# Patient Record
Sex: Male | Born: 1960 | Race: White | Hispanic: No | Marital: Single | Smoking: Former smoker
Health system: Southern US, Community
[De-identification: ages and names within clinical notes are randomized; demographics above are authoritative.]

## PROBLEM LIST (undated history)

## (undated) DIAGNOSIS — I509 Heart failure, unspecified: Secondary | ICD-10-CM

## (undated) DIAGNOSIS — I10 Essential (primary) hypertension: Secondary | ICD-10-CM

## (undated) DIAGNOSIS — I809 Phlebitis and thrombophlebitis of unspecified site: Secondary | ICD-10-CM

## (undated) DIAGNOSIS — M549 Dorsalgia, unspecified: Secondary | ICD-10-CM

## (undated) DIAGNOSIS — F329 Major depressive disorder, single episode, unspecified: Secondary | ICD-10-CM

## (undated) DIAGNOSIS — G8929 Other chronic pain: Secondary | ICD-10-CM

## (undated) DIAGNOSIS — B1910 Unspecified viral hepatitis B without hepatic coma: Secondary | ICD-10-CM

## (undated) DIAGNOSIS — J449 Chronic obstructive pulmonary disease, unspecified: Secondary | ICD-10-CM

## (undated) DIAGNOSIS — N189 Chronic kidney disease, unspecified: Secondary | ICD-10-CM

## (undated) DIAGNOSIS — F419 Anxiety disorder, unspecified: Secondary | ICD-10-CM

## (undated) DIAGNOSIS — F32A Depression, unspecified: Secondary | ICD-10-CM

## (undated) HISTORY — DX: Anxiety disorder, unspecified: F41.9

## (undated) HISTORY — DX: Depression, unspecified: F32.A

## (undated) HISTORY — DX: Essential (primary) hypertension: I10

## (undated) HISTORY — PX: WISDOM TOOTH EXTRACTION: SHX21

## (undated) HISTORY — DX: Major depressive disorder, single episode, unspecified: F32.9

## (undated) HISTORY — DX: Dorsalgia, unspecified: M54.9

## (undated) HISTORY — DX: Chronic kidney disease, unspecified: N18.9

## (undated) HISTORY — DX: Chronic obstructive pulmonary disease, unspecified: J44.9

## (undated) HISTORY — DX: Other chronic pain: G89.29

## (undated) HISTORY — DX: Phlebitis and thrombophlebitis of unspecified site: I80.9

---

## 1990-08-24 DIAGNOSIS — I809 Phlebitis and thrombophlebitis of unspecified site: Secondary | ICD-10-CM

## 1990-08-24 HISTORY — DX: Phlebitis and thrombophlebitis of unspecified site: I80.9

## 2004-10-10 ENCOUNTER — Ambulatory Visit: Payer: Self-pay

## 2004-10-30 ENCOUNTER — Emergency Department: Payer: Self-pay | Admitting: Internal Medicine

## 2007-07-18 ENCOUNTER — Emergency Department: Payer: Self-pay | Admitting: Emergency Medicine

## 2008-06-19 ENCOUNTER — Emergency Department: Payer: Self-pay | Admitting: Emergency Medicine

## 2009-03-19 ENCOUNTER — Emergency Department: Payer: Self-pay | Admitting: Internal Medicine

## 2011-03-09 ENCOUNTER — Emergency Department: Payer: Self-pay | Admitting: Emergency Medicine

## 2011-03-13 ENCOUNTER — Inpatient Hospital Stay: Payer: Self-pay | Admitting: Internal Medicine

## 2011-06-22 ENCOUNTER — Other Ambulatory Visit: Payer: Self-pay | Admitting: Nephrology

## 2013-08-18 LAB — BASIC METABOLIC PANEL
Anion Gap: 4 — ABNORMAL LOW (ref 7–16)
BUN: 22 mg/dL — ABNORMAL HIGH (ref 7–18)
Calcium, Total: 8.8 mg/dL (ref 8.5–10.1)
Chloride: 107 mmol/L (ref 98–107)
Co2: 29 mmol/L (ref 21–32)
EGFR (African American): 45 — ABNORMAL LOW
Glucose: 103 mg/dL — ABNORMAL HIGH (ref 65–99)
Osmolality: 283 (ref 275–301)
Sodium: 140 mmol/L (ref 136–145)

## 2013-08-18 LAB — CBC
HCT: 40.7 % (ref 40.0–52.0)
MCH: 30.3 pg (ref 26.0–34.0)
MCHC: 33.3 g/dL (ref 32.0–36.0)
MCV: 91 fL (ref 80–100)
Platelet: 72 10*3/uL — ABNORMAL LOW (ref 150–440)
RBC: 4.47 10*6/uL (ref 4.40–5.90)
RDW: 15.5 % — ABNORMAL HIGH (ref 11.5–14.5)

## 2013-08-18 LAB — TROPONIN I: Troponin-I: 0.05 ng/mL

## 2013-08-19 ENCOUNTER — Inpatient Hospital Stay: Payer: Self-pay | Admitting: Internal Medicine

## 2013-08-19 LAB — CK-MB
CK-MB: 2.6 ng/mL (ref 0.5–3.6)
CK-MB: 2.5 ng/mL (ref 0.5–3.6)

## 2013-08-19 LAB — TROPONIN I: Troponin-I: 0.06 ng/mL — ABNORMAL HIGH

## 2013-08-19 LAB — MAGNESIUM: Magnesium: 1.7 mg/dL — ABNORMAL LOW

## 2013-08-20 LAB — CBC WITH DIFFERENTIAL/PLATELET
Basophil %: 0.1 %
Eosinophil #: 0 10*3/uL (ref 0.0–0.7)
HCT: 39.5 % — ABNORMAL LOW (ref 40.0–52.0)
HGB: 13.2 g/dL (ref 13.0–18.0)
Lymphocyte %: 7.1 %
MCH: 30.3 pg (ref 26.0–34.0)
MCHC: 33.4 g/dL (ref 32.0–36.0)
Monocyte #: 0.6 x10 3/mm (ref 0.2–1.0)
Monocyte %: 7.6 %
Neutrophil #: 6.5 10*3/uL (ref 1.4–6.5)
Neutrophil %: 85.1 %
Platelet: 86 10*3/uL — ABNORMAL LOW (ref 150–440)
RBC: 4.36 10*6/uL — ABNORMAL LOW (ref 4.40–5.90)

## 2013-08-20 LAB — BASIC METABOLIC PANEL
Anion Gap: 6 — ABNORMAL LOW (ref 7–16)
Calcium, Total: 8.3 mg/dL — ABNORMAL LOW (ref 8.5–10.1)
Chloride: 103 mmol/L (ref 98–107)
Creatinine: 2.27 mg/dL — ABNORMAL HIGH (ref 0.60–1.30)
EGFR (African American): 37 — ABNORMAL LOW
Glucose: 210 mg/dL — ABNORMAL HIGH (ref 65–99)
Osmolality: 283 (ref 275–301)

## 2013-08-20 LAB — LIPID PANEL
Cholesterol: 144 mg/dL (ref 0–200)
HDL Cholesterol: 47 mg/dL (ref 40–60)
Ldl Cholesterol, Calc: 87 mg/dL (ref 0–100)
Triglycerides: 52 mg/dL (ref 0–200)

## 2013-08-20 LAB — TSH: Thyroid Stimulating Horm: 0.527 u[IU]/mL

## 2014-03-17 ENCOUNTER — Inpatient Hospital Stay: Payer: Self-pay | Admitting: Internal Medicine

## 2014-03-17 LAB — CBC
HCT: 38.3 % — ABNORMAL LOW (ref 40.0–52.0)
HGB: 12.5 g/dL — AB (ref 13.0–18.0)
MCH: 29.5 pg (ref 26.0–34.0)
MCHC: 32.6 g/dL (ref 32.0–36.0)
MCV: 90 fL (ref 80–100)
PLATELETS: 96 10*3/uL — AB (ref 150–440)
RBC: 4.24 10*6/uL — AB (ref 4.40–5.90)
RDW: 16.8 % — AB (ref 11.5–14.5)
WBC: 5.5 10*3/uL (ref 3.8–10.6)

## 2014-03-17 LAB — COMPREHENSIVE METABOLIC PANEL
ALK PHOS: 111 U/L
ANION GAP: 4 — AB (ref 7–16)
AST: 25 U/L (ref 15–37)
Albumin: 2.9 g/dL — ABNORMAL LOW (ref 3.4–5.0)
BUN: 19 mg/dL — ABNORMAL HIGH (ref 7–18)
Bilirubin,Total: 0.9 mg/dL (ref 0.2–1.0)
CO2: 29 mmol/L (ref 21–32)
CREATININE: 2.11 mg/dL — AB (ref 0.60–1.30)
Calcium, Total: 8.1 mg/dL — ABNORMAL LOW (ref 8.5–10.1)
Chloride: 107 mmol/L (ref 98–107)
EGFR (African American): 40 — ABNORMAL LOW
EGFR (Non-African Amer.): 34 — ABNORMAL LOW
Glucose: 116 mg/dL — ABNORMAL HIGH (ref 65–99)
Osmolality: 283 (ref 275–301)
Potassium: 3.8 mmol/L (ref 3.5–5.1)
SGPT (ALT): 16 U/L
SODIUM: 140 mmol/L (ref 136–145)
Total Protein: 6.9 g/dL (ref 6.4–8.2)

## 2014-03-17 LAB — CK TOTAL AND CKMB (NOT AT ARMC)
CK, Total: 94 U/L
CK, Total: 86 U/L
CK, Total: 74 U/L
CK-MB: 3 ng/mL (ref 0.5–3.6)
CK-MB: 2.9 ng/mL (ref 0.5–3.6)
CK-MB: 2.7 ng/mL (ref 0.5–3.6)

## 2014-03-17 LAB — TROPONIN I
Troponin-I: 0.05 ng/mL
Troponin-I: 0.04 ng/mL
Troponin-I: 0.03 ng/mL

## 2014-03-17 LAB — PRO B NATRIURETIC PEPTIDE: B-TYPE NATIURETIC PEPTID: 1564 pg/mL — AB (ref 0–125)

## 2014-03-18 LAB — BASIC METABOLIC PANEL
ANION GAP: 8 (ref 7–16)
BUN: 27 mg/dL — ABNORMAL HIGH (ref 7–18)
CALCIUM: 7.7 mg/dL — AB (ref 8.5–10.1)
CHLORIDE: 103 mmol/L (ref 98–107)
CO2: 25 mmol/L (ref 21–32)
Creatinine: 2.37 mg/dL — ABNORMAL HIGH (ref 0.60–1.30)
GFR CALC AF AMER: 35 — AB
GFR CALC NON AF AMER: 30 — AB
Glucose: 172 mg/dL — ABNORMAL HIGH (ref 65–99)
OSMOLALITY: 281 (ref 275–301)
Potassium: 4.4 mmol/L (ref 3.5–5.1)
SODIUM: 136 mmol/L (ref 136–145)

## 2014-03-18 LAB — CBC WITH DIFFERENTIAL/PLATELET
Basophil #: 0 10*3/uL (ref 0.0–0.1)
Basophil %: 0.4 %
EOS ABS: 0 10*3/uL (ref 0.0–0.7)
EOS PCT: 0 %
HCT: 36.5 % — ABNORMAL LOW (ref 40.0–52.0)
HGB: 12.2 g/dL — ABNORMAL LOW (ref 13.0–18.0)
Lymphocyte #: 0.5 10*3/uL — ABNORMAL LOW (ref 1.0–3.6)
Lymphocyte %: 4.7 %
MCH: 29.9 pg (ref 26.0–34.0)
MCHC: 33.5 g/dL (ref 32.0–36.0)
MCV: 89 fL (ref 80–100)
MONO ABS: 0.2 x10 3/mm (ref 0.2–1.0)
MONOS PCT: 2.4 %
NEUTROS PCT: 92.5 %
Neutrophil #: 9.2 10*3/uL — ABNORMAL HIGH (ref 1.4–6.5)
PLATELETS: 99 10*3/uL — AB (ref 150–440)
RBC: 4.08 10*6/uL — ABNORMAL LOW (ref 4.40–5.90)
RDW: 16.9 % — AB (ref 11.5–14.5)
WBC: 9.9 10*3/uL (ref 3.8–10.6)

## 2014-05-13 ENCOUNTER — Inpatient Hospital Stay: Payer: Self-pay | Admitting: Internal Medicine

## 2014-05-13 LAB — URINALYSIS, COMPLETE
BILIRUBIN, UR: NEGATIVE
Bacteria: NONE SEEN
Glucose,UR: NEGATIVE mg/dL (ref 0–75)
Ketone: NEGATIVE
Leukocyte Esterase: NEGATIVE
NITRITE: NEGATIVE
Ph: 5 (ref 4.5–8.0)
Protein: 100
Specific Gravity: 1.017 (ref 1.003–1.030)
Squamous Epithelial: NONE SEEN
WBC UR: 2 /HPF (ref 0–5)

## 2014-05-13 LAB — TROPONIN I
TROPONIN-I: 0.05 ng/mL
TROPONIN-I: 0.04 ng/mL
Troponin-I: 0.08 ng/mL — ABNORMAL HIGH
Troponin-I: 0.04 ng/mL

## 2014-05-13 LAB — BASIC METABOLIC PANEL
Anion Gap: 7 (ref 7–16)
BUN: 17 mg/dL (ref 7–18)
CO2: 27 mmol/L (ref 21–32)
Calcium, Total: 8 mg/dL — ABNORMAL LOW (ref 8.5–10.1)
Chloride: 106 mmol/L (ref 98–107)
Creatinine: 2.19 mg/dL — ABNORMAL HIGH (ref 0.60–1.30)
EGFR (Non-African Amer.): 33 — ABNORMAL LOW
GFR CALC AF AMER: 38 — AB
Glucose: 94 mg/dL (ref 65–99)
Osmolality: 281 (ref 275–301)
POTASSIUM: 3.6 mmol/L (ref 3.5–5.1)
SODIUM: 140 mmol/L (ref 136–145)

## 2014-05-13 LAB — CBC
HCT: 36.8 % — ABNORMAL LOW (ref 40.0–52.0)
HGB: 12.1 g/dL — ABNORMAL LOW (ref 13.0–18.0)
MCH: 29.2 pg (ref 26.0–34.0)
MCHC: 32.9 g/dL (ref 32.0–36.0)
MCV: 89 fL (ref 80–100)
PLATELETS: 73 10*3/uL — AB (ref 150–440)
RBC: 4.15 10*6/uL — ABNORMAL LOW (ref 4.40–5.90)
RDW: 18.4 % — ABNORMAL HIGH (ref 11.5–14.5)
WBC: 6.4 10*3/uL (ref 3.8–10.6)

## 2014-05-13 LAB — PRO B NATRIURETIC PEPTIDE: B-TYPE NATIURETIC PEPTID: 3245 pg/mL — AB (ref 0–125)

## 2014-05-14 LAB — CBC WITH DIFFERENTIAL/PLATELET
Basophil #: 0 10*3/uL (ref 0.0–0.1)
Basophil %: 0.2 %
Eosinophil #: 0 10*3/uL (ref 0.0–0.7)
Eosinophil %: 0.1 %
HCT: 38.3 % — ABNORMAL LOW (ref 40.0–52.0)
HGB: 12.4 g/dL — ABNORMAL LOW (ref 13.0–18.0)
Lymphocyte #: 0.5 10*3/uL — ABNORMAL LOW (ref 1.0–3.6)
Lymphocyte %: 4.4 %
MCH: 29.1 pg (ref 26.0–34.0)
MCHC: 32.3 g/dL (ref 32.0–36.0)
MCV: 90 fL (ref 80–100)
Monocyte #: 0.3 x10 3/mm (ref 0.2–1.0)
Monocyte %: 2.8 %
Neutrophil #: 10.7 10*3/uL — ABNORMAL HIGH (ref 1.4–6.5)
Neutrophil %: 92.5 %
Platelet: 96 10*3/uL — ABNORMAL LOW (ref 150–440)
RBC: 4.25 10*6/uL — ABNORMAL LOW (ref 4.40–5.90)
RDW: 18.7 % — ABNORMAL HIGH (ref 11.5–14.5)
WBC: 11.6 10*3/uL — ABNORMAL HIGH (ref 3.8–10.6)

## 2014-05-14 LAB — LIPID PANEL
Cholesterol: 134 mg/dL (ref 0–200)
HDL Cholesterol: 44 mg/dL (ref 40–60)
Ldl Cholesterol, Calc: 80 mg/dL (ref 0–100)
Triglycerides: 48 mg/dL (ref 0–200)
VLDL Cholesterol, Calc: 10 mg/dL (ref 5–40)

## 2014-05-14 LAB — COMPREHENSIVE METABOLIC PANEL
Albumin: 2.9 g/dL — ABNORMAL LOW (ref 3.4–5.0)
Alkaline Phosphatase: 87 U/L
Anion Gap: 8 (ref 7–16)
BUN: 26 mg/dL — ABNORMAL HIGH (ref 7–18)
Bilirubin,Total: 0.6 mg/dL (ref 0.2–1.0)
Calcium, Total: 8.2 mg/dL — ABNORMAL LOW (ref 8.5–10.1)
Chloride: 106 mmol/L (ref 98–107)
Co2: 22 mmol/L (ref 21–32)
Creatinine: 2.26 mg/dL — ABNORMAL HIGH (ref 0.60–1.30)
EGFR (African American): 37 — ABNORMAL LOW
EGFR (Non-African Amer.): 32 — ABNORMAL LOW
Glucose: 146 mg/dL — ABNORMAL HIGH (ref 65–99)
Osmolality: 279 (ref 275–301)
Potassium: 4.1 mmol/L (ref 3.5–5.1)
SGOT(AST): 18 U/L (ref 15–37)
SGPT (ALT): 15 U/L
Sodium: 136 mmol/L (ref 136–145)
Total Protein: 6.6 g/dL (ref 6.4–8.2)

## 2014-05-14 LAB — TSH: Thyroid Stimulating Horm: 0.66 u[IU]/mL

## 2014-05-14 LAB — URINE CULTURE

## 2014-05-18 LAB — CULTURE, BLOOD (SINGLE)

## 2014-06-11 ENCOUNTER — Ambulatory Visit: Payer: Self-pay | Admitting: Family

## 2014-07-13 ENCOUNTER — Ambulatory Visit: Payer: Self-pay | Admitting: Family

## 2014-10-02 ENCOUNTER — Ambulatory Visit: Payer: Self-pay | Admitting: Family

## 2014-10-18 ENCOUNTER — Ambulatory Visit: Payer: Self-pay | Admitting: Family

## 2014-10-26 ENCOUNTER — Inpatient Hospital Stay: Payer: Self-pay | Admitting: Specialist

## 2014-11-04 ENCOUNTER — Inpatient Hospital Stay: Payer: Self-pay | Admitting: Internal Medicine

## 2014-11-15 ENCOUNTER — Ambulatory Visit: Payer: Self-pay | Admitting: Family

## 2014-11-15 LAB — CBC WITH DIFFERENTIAL/PLATELET
BASOS ABS: 0 10*3/uL (ref 0.0–0.1)
Basophil %: 0.4 %
Eosinophil #: 0.2 10*3/uL (ref 0.0–0.7)
Eosinophil %: 4.7 %
HCT: 36.7 % — ABNORMAL LOW (ref 40.0–52.0)
HGB: 11.7 g/dL — ABNORMAL LOW (ref 13.0–18.0)
LYMPHS PCT: 17.9 %
Lymphocyte #: 0.8 10*3/uL — ABNORMAL LOW (ref 1.0–3.6)
MCH: 28.7 pg (ref 26.0–34.0)
MCHC: 31.7 g/dL — AB (ref 32.0–36.0)
MCV: 91 fL (ref 80–100)
Monocyte #: 0.6 x10 3/mm (ref 0.2–1.0)
Monocyte %: 12.2 %
NEUTROS ABS: 2.9 10*3/uL (ref 1.4–6.5)
Neutrophil %: 64.8 %
PLATELETS: 73 10*3/uL — AB (ref 150–440)
RBC: 4.06 10*6/uL — ABNORMAL LOW (ref 4.40–5.90)
RDW: 16.5 % — AB (ref 11.5–14.5)
WBC: 4.5 10*3/uL (ref 3.8–10.6)

## 2014-11-15 LAB — TROPONIN I: Troponin-I: 0.04 ng/mL — ABNORMAL HIGH

## 2014-11-16 ENCOUNTER — Observation Stay: Payer: Self-pay | Admitting: Internal Medicine

## 2014-11-16 LAB — COMPREHENSIVE METABOLIC PANEL
ANION GAP: 7 (ref 7–16)
Albumin: 3 g/dL — ABNORMAL LOW
Alkaline Phosphatase: 106 U/L
BUN: 19 mg/dL
Bilirubin,Total: 1 mg/dL
CHLORIDE: 109 mmol/L
CO2: 26 mmol/L
Calcium, Total: 8.6 mg/dL — ABNORMAL LOW
Creatinine: 2.28 mg/dL — ABNORMAL HIGH
GFR CALC AF AMER: 36 — AB
GFR CALC NON AF AMER: 31 — AB
GLUCOSE: 110 mg/dL — AB
POTASSIUM: 4.2 mmol/L
SGOT(AST): 21 U/L
SGPT (ALT): 13 U/L — ABNORMAL LOW
SODIUM: 142 mmol/L
Total Protein: 6.2 g/dL — ABNORMAL LOW

## 2014-11-16 LAB — URINALYSIS, COMPLETE
Bacteria: NONE SEEN
Bilirubin,UR: NEGATIVE
Blood: NEGATIVE
Glucose,UR: NEGATIVE mg/dL
Ketone: NEGATIVE
Leukocyte Esterase: NEGATIVE
Nitrite: NEGATIVE
Ph: 7
Protein: 100
RBC,UR: <1 /HPF
Specific Gravity: 1.011
Squamous Epithelial: NONE SEEN
WBC UR: <1 /HPF

## 2014-11-16 LAB — PRO B NATRIURETIC PEPTIDE: B-TYPE NATIURETIC PEPTID: 933 pg/mL — AB

## 2014-11-16 LAB — LIPASE, BLOOD: Lipase: 37 U/L

## 2014-11-17 LAB — CREATININE, SERUM
Creatinine: 2.94 mg/dL — ABNORMAL HIGH
EGFR (African American): 27 — ABNORMAL LOW
EGFR (Non-African Amer.): 23 — ABNORMAL LOW

## 2014-11-17 LAB — TROPONIN I: Troponin-I: 0.03 ng/mL

## 2014-11-17 LAB — TSH: Thyroid Stimulating Horm: 2.842 u[IU]/mL

## 2014-11-17 LAB — PLATELET COUNT: Platelet: 88 10*3/uL — ABNORMAL LOW (ref 150–440)

## 2014-11-18 LAB — CBC WITH DIFFERENTIAL/PLATELET
Basophil #: 0 x10 3/mm 3
Basophil %: 0.2 %
Eosinophil #: 0 x10 3/mm 3
Eosinophil %: 0.1 %
HCT: 34.6 % — ABNORMAL LOW
HGB: 11.3 g/dL — ABNORMAL LOW
Lymphocyte %: 15 %
Lymphs Abs: 0.5 x10 3/mm 3 — ABNORMAL LOW
MCH: 29.7 pg
MCHC: 32.7 g/dL
MCV: 91 fL
Monocyte #: 0.2 "x10 3/mm "
Monocyte %: 7.5 %
Neutrophil #: 2.4 x10 3/mm 3
Neutrophil %: 77.2 %
Platelet: 79 x10 3/mm 3 — ABNORMAL LOW
RBC: 3.81 x10 6/mm 3 — ABNORMAL LOW
RDW: 16.3 % — ABNORMAL HIGH
WBC: 3.1 x10 3/mm 3 — ABNORMAL LOW

## 2014-11-18 LAB — CREATININE, SERUM
Creatinine: 2.75 mg/dL — ABNORMAL HIGH
EGFR (African American): 29 — ABNORMAL LOW
EGFR (Non-African Amer.): 25 — ABNORMAL LOW

## 2014-11-18 LAB — POTASSIUM: Potassium: 4.2 mmol/L

## 2014-11-25 ENCOUNTER — Inpatient Hospital Stay
Admit: 2014-11-25 | Disposition: A | Discharge status: Home / Self Care | Payer: Self-pay | Attending: Internal Medicine | Admitting: Internal Medicine

## 2014-11-25 LAB — COMPREHENSIVE METABOLIC PANEL
ANION GAP: 6 — AB (ref 7–16)
Albumin: 3.2 g/dL — ABNORMAL LOW
Alkaline Phosphatase: 72 U/L
BILIRUBIN TOTAL: 0.6 mg/dL
BUN: 38 mg/dL — AB
CHLORIDE: 105 mmol/L
Calcium, Total: 8.9 mg/dL
Co2: 28 mmol/L
Creatinine: 2.52 mg/dL — ABNORMAL HIGH
EGFR (African American): 32 — ABNORMAL LOW
EGFR (Non-African Amer.): 28 — ABNORMAL LOW
Glucose: 152 mg/dL — ABNORMAL HIGH
Potassium: 4 mmol/L
SGOT(AST): 17 U/L
SGPT (ALT): 16 U/L — ABNORMAL LOW
SODIUM: 139 mmol/L
Total Protein: 6.7 g/dL

## 2014-11-25 LAB — CBC
HCT: 35.1 % — AB (ref 40.0–52.0)
HGB: 11.3 g/dL — ABNORMAL LOW (ref 13.0–18.0)
MCH: 29.6 pg (ref 26.0–34.0)
MCHC: 32.2 g/dL (ref 32.0–36.0)
MCV: 92 fL (ref 80–100)
PLATELETS: 87 10*3/uL — AB (ref 150–440)
RBC: 3.81 10*6/uL — AB (ref 4.40–5.90)
RDW: 17.6 % — ABNORMAL HIGH (ref 11.5–14.5)
WBC: 6.3 10*3/uL (ref 3.8–10.6)

## 2014-11-25 LAB — CK TOTAL AND CKMB (NOT AT ARMC)
CK, Total: 29 U/L — ABNORMAL LOW
CK-MB: 2.4 ng/mL

## 2014-11-25 LAB — PROTIME-INR
INR: 1.2
Prothrombin Time: 15.8 secs — ABNORMAL HIGH

## 2014-11-25 LAB — APTT: ACTIVATED PTT: 35.7 s (ref 23.6–35.9)

## 2014-11-25 LAB — TROPONIN I: Troponin-I: <0.03 ng/mL

## 2014-11-25 LAB — PRO B NATRIURETIC PEPTIDE: B-Type Natriuretic Peptide: 1521 pg/mL — ABNORMAL HIGH

## 2014-11-26 LAB — COMPREHENSIVE METABOLIC PANEL
ALBUMIN: 3.1 g/dL — AB
Alkaline Phosphatase: 72 U/L
Anion Gap: 8 (ref 7–16)
BUN: 40 mg/dL — ABNORMAL HIGH
Bilirubin,Total: 0.7 mg/dL
CALCIUM: 8.6 mg/dL — AB
Chloride: 104 mmol/L
Co2: 26 mmol/L
Creatinine: 2.49 mg/dL — ABNORMAL HIGH
EGFR (Non-African Amer.): 28 — ABNORMAL LOW
GFR CALC AF AMER: 32 — AB
Glucose: 202 mg/dL — ABNORMAL HIGH
Potassium: 4.3 mmol/L
SGOT(AST): 18 U/L
SGPT (ALT): 16 U/L — ABNORMAL LOW
Sodium: 138 mmol/L
Total Protein: 6.6 g/dL

## 2014-11-26 LAB — CBC WITH DIFFERENTIAL/PLATELET
Basophil #: 0 10*3/uL (ref 0.0–0.1)
Basophil %: 0.1 %
Eosinophil #: 0 10*3/uL (ref 0.0–0.7)
Eosinophil %: 0 %
HCT: 34.1 % — ABNORMAL LOW (ref 40.0–52.0)
HGB: 11 g/dL — AB (ref 13.0–18.0)
LYMPHS PCT: 7 %
Lymphocyte #: 0.3 10*3/uL — ABNORMAL LOW (ref 1.0–3.6)
MCH: 29.2 pg (ref 26.0–34.0)
MCHC: 32.2 g/dL (ref 32.0–36.0)
MCV: 91 fL (ref 80–100)
MONOS PCT: 3.4 %
Monocyte #: 0.2 x10 3/mm (ref 0.2–1.0)
NEUTROS PCT: 89.5 %
Neutrophil #: 4.3 10*3/uL (ref 1.4–6.5)
PLATELETS: 84 10*3/uL — AB (ref 150–440)
RBC: 3.76 10*6/uL — AB (ref 4.40–5.90)
RDW: 17.4 % — AB (ref 11.5–14.5)
WBC: 4.8 10*3/uL (ref 3.8–10.6)

## 2014-11-27 LAB — BASIC METABOLIC PANEL
ANION GAP: 7 (ref 7–16)
BUN: 41 mg/dL — AB
Calcium, Total: 8.3 mg/dL — ABNORMAL LOW
Chloride: 100 mmol/L — ABNORMAL LOW
Co2: 26 mmol/L
Creatinine: 2.56 mg/dL — ABNORMAL HIGH
EGFR (Non-African Amer.): 27 — ABNORMAL LOW
GFR CALC AF AMER: 31 — AB
GLUCOSE: 167 mg/dL — AB
POTASSIUM: 4.1 mmol/L
Sodium: 133 mmol/L — ABNORMAL LOW

## 2014-11-29 ENCOUNTER — Ambulatory Visit
Admit: 2014-11-29 | Disposition: A | Discharge status: Home / Self Care | Payer: Self-pay | Attending: Family | Admitting: Family

## 2014-11-30 DIAGNOSIS — I10 Essential (primary) hypertension: Secondary | ICD-10-CM | POA: Insufficient documentation

## 2014-11-30 DIAGNOSIS — M549 Dorsalgia, unspecified: Secondary | ICD-10-CM

## 2014-11-30 DIAGNOSIS — I5031 Acute diastolic (congestive) heart failure: Secondary | ICD-10-CM

## 2014-12-12 ENCOUNTER — Emergency Department
Admit: 2014-12-12 | Disposition: A | Discharge status: Home / Self Care | Payer: Self-pay | Admitting: Emergency Medicine

## 2014-12-12 LAB — CBC WITH DIFFERENTIAL/PLATELET
Basophil #: 0 10*3/uL (ref 0.0–0.1)
Basophil %: 0.9 %
EOS ABS: 0.2 10*3/uL (ref 0.0–0.7)
EOS PCT: 3.4 %
HCT: 34.9 % — ABNORMAL LOW (ref 40.0–52.0)
HGB: 11.2 g/dL — ABNORMAL LOW (ref 13.0–18.0)
Lymphocyte #: 0.7 10*3/uL — ABNORMAL LOW (ref 1.0–3.6)
Lymphocyte %: 15.2 %
MCH: 28.6 pg (ref 26.0–34.0)
MCHC: 32.2 g/dL (ref 32.0–36.0)
MCV: 89 fL (ref 80–100)
MONO ABS: 1 x10 3/mm (ref 0.2–1.0)
Monocyte %: 19.5 %
Neutrophil #: 3 10*3/uL (ref 1.4–6.5)
Neutrophil %: 61 %
Platelet: 94 10*3/uL — ABNORMAL LOW (ref 150–440)
RBC: 3.93 10*6/uL — AB (ref 4.40–5.90)
RDW: 17.8 % — AB (ref 11.5–14.5)
WBC: 4.9 10*3/uL (ref 3.8–10.6)

## 2014-12-12 LAB — COMPREHENSIVE METABOLIC PANEL
ANION GAP: 6 — AB (ref 7–16)
Albumin: 3.2 g/dL — ABNORMAL LOW
Alkaline Phosphatase: 71 U/L
BILIRUBIN TOTAL: 1.3 mg/dL — AB
BUN: 36 mg/dL — ABNORMAL HIGH
CHLORIDE: 100 mmol/L — AB
CO2: 34 mmol/L — AB
CREATININE: 2.57 mg/dL — AB
Calcium, Total: 8.6 mg/dL — ABNORMAL LOW
EGFR (Non-African Amer.): 27 — ABNORMAL LOW
GFR CALC AF AMER: 31 — AB
GLUCOSE: 107 mg/dL — AB
Potassium: 4.5 mmol/L
SGOT(AST): 31 U/L
SGPT (ALT): 18 U/L
SODIUM: 140 mmol/L
Total Protein: 6.5 g/dL

## 2014-12-12 LAB — LIPASE, BLOOD: Lipase: 39 U/L

## 2014-12-13 LAB — URINALYSIS, COMPLETE
Bacteria: NONE SEEN
Bilirubin,UR: NEGATIVE
Blood: NEGATIVE
Glucose,UR: NEGATIVE mg/dL (ref 0–75)
Ketone: NEGATIVE
Leukocyte Esterase: NEGATIVE
NITRITE: NEGATIVE
Ph: 5 (ref 4.5–8.0)
Protein: 100
SPECIFIC GRAVITY: 1.011 (ref 1.003–1.030)
Squamous Epithelial: NONE SEEN

## 2014-12-14 NOTE — Discharge Summary (Signed)
PATIENT NAME:  Gary MonarchMARLEY, Sony B MR#:  [id number] DATE OF BIRTH:  1961/05/13  DATE OF ADMISSION:  08/19/2013 DATE OF DISCHARGE:  08/20/2013  ADMISSION DIAGNOSIS: Shortness of breath.   DISCHARGE DIAGNOSES: 1. Shortness of breath, likely secondary to acute bronchitis with reactive airway disease.  2. Chest pain secondary to problem #1. 3. Chronic headaches with chronic migraines.  4. 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  5. Leg cramps.  6. Chronic renal failure.  7. Smoking dependence.  8. Elevated troponin from supply/demand ischemia.  9. Thrombocytopenia.   DISCHARGE LABORATORY DATA: Sodium 134, potassium 4.0, chloride 103, bicarbonate 25, BUN 36, creatinine 2.27, glucose 210. Troponin is 0.06. TSH of 0.527, white blood cells 7.7, hemoglobin 13.2, hematocrit 36, platelets 86.   CONSULTATIONS: Dr. Juliann Paresallwood.   A 2-D echocardiogram showed an ejection fraction of 70% to 75% with mildly dilated left atrium, severely increased left ventricular posterior wall thickness.   HOSPITAL COURSE: This is a 54 year old male who presented with shortness of breath and malignant hypertension. For further details, please refer to H and P.   1. Shortness of breath, multifactorial related to elevated blood pressure as well as reactive airway disease. The patient had an elevated BNP but his chest x-ray was essentially normal. I did not feel the patient had any kind of congestive heart failure. I think it was more related to his reactive airway disease and bronchitis rather than congestive heart failure. He was started on IV steroids and actually improved with this regimen, as well as antibiotics. He is not requiring oxygen or changing him to p.o. steroids and will continue his antibiotics and be discharged with inhalers.  2. 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 The patient will continue his outpatient medications. His blood pressure was better controlled once we had him on his medications.  3.  Headache. The patient is complaining of a headache. Nitroglycerin was taken off, his headache got better. He does have chronic neck migraines. I strongly advised the patient see a neurologist as an outpatient. He had no focal deficits.  4. Elevated troponins and supply/demand ischemia, not due to acute coronary syndrome. Appreciate Dr. Juliann Paresallwood consult. His echo is stated as above. The patient at some point will need some kind of outpatient cardiac work-up.  5. Leg cramps from hypokalemia, improved. 6. Chronic renal failure. He is at his baseline.  7. Smoking cessation. The patient says he smokes 5 cigarettes a day, did not want a patch. He was counseled.   DISCHARGE MEDICATIONS: 1. Lasix 20 mg daily.  2. Metoprolol 50 mg b.i.d.  3. Clonidine 0.2 mg b.i.d.  4. Hydralazine 50 mg t.i.d.  5. Vitamin D 50,000 international units weekly.  6. Prednisone taper starting at 60 mg, taper by 10 mg every two days.  7. Azithromycin 250 mg x3 days.  8. Advair Diskus 250/50 b.i.d.   DISCHARGE DIET: Low sodium diet.   DISCHARGE FOLLOWUP: Patient will follow up with his primary care physician in 1 to 2 weeks.   TIME SPENT: Approximately 35 minutes on discharge. The patient is medically stable for discharge.  ____________________________ Khayri Kargbo P. Juliene PinaMody, MD spm:kb D: 08/20/2013 14:16:45 ET T: 08/21/2013 00:21:04 ET JOB#: [id number]  cc: Johnni Wunschel P. Juliene PinaMody, MD, <Dictator> Janyth ContesSITAL P Nahmir Zeidman MD ELECTRONICALLY SIGNED 08/21/2013 13:04

## 2014-12-14 NOTE — H&P (Signed)
PATIENT NAME:  Gary Juarez, Gary Juarez MR#:  [id number] DATE OF BIRTH:  Sep 12, 1960  DATE OF ADMISSION:  08/19/2013  PRIMARY CARE PHYSICIAN: None.   REFERRING PHYSICIAN: Dorothea Glassman, MD  CHIEF COMPLAINT: Chest pain and shortness of breath.   HISTORY OF PRESENT ILLNESS: The patient is a 54 year old Caucasian male with past medical history of hypertension, chronic renal insufficiency, history of anasarca from proteinuria with low albumin in the past, chronic kidney disease and tobacco abuse, who is presenting to the ER with a chief complaint of chest pain and shortness of breath. The patient is reporting that he has been not taking any of his blood pressure medications for the past 1 year because of the financial burden. The patient started developing chest pressure on the left side of the chest radiating to the left shoulder and shortness of breath. He also reported left shoulder pain, but he has mentioned that this is chronic in nature. Felt nauseous, but denies any vomiting. Denies any dizziness or loss of consciousness. In the ER, the patient was placed on 2 to 3 liters of oxygen. Initial blood pressure was very high at 216/114 with mean blood pressure of 148. The patient was given 10 mg of labetalol IV x2 and also Dilaudid was given for pain. The patient's blood pressure eventually trended down to 157/75 with pulse 67. The patient also has reported that EMS gave him 4 baby aspirin on his way, which did not relieve his pain, but after Dilaudid was given, pain was significantly improved. The patient has reported that he had cardiac stress test done in year 1990 which was normal. The patient was supposed to see cardiology as an outpatient, but he has not followed up with them in the past 1 year.   PAST MEDICAL HISTORY:  1. Hypertension.  2. Chronic renal insufficiency.  3. Chronic low back pain.  4. Degenerative joint disease.  5. Tobacco abuse.  6. Hypertension.  7. Hepatitis B.   PAST SURGICAL  HISTORY: Denies any.  ALLERGIES: MORPHINE, BUT STATES HE CAN TOLERATE VICODIN AND OXYCODONE.   FAMILY HISTORY: Mother deceased at age 78 secondary to metastatic bone cancer with metastasis to brain. Father is alive at age 9 with a history of coronary artery disease status post coronary stenting.   SOCIAL HISTORY: Lives alone. Smokes half-pack a day and has been smoking for the past 30 years. Denies alcohol or illicit drug usage.   HOME MEDICATIONS:  1. Metoprolol 50 mg 2 times a day. 2. Lasix 20 mg once daily.  3. Hydralazine 50 mg 3 times a day.  4. Clonidine 0.2 mg 2 times a day.   REVIEW OF SYSTEMS: CONSTITUTIONAL: Denies any fever or fatigue.  EYES: Denies blurry vision, double vision. ENT: Denies any epistaxis, discharge.  RESPIRATION: Denies cough, hemoptysis.  CARDIOVASCULAR: Complaining of left-sided chest pain associated with shortness of breath. Denies any palpitations or syncope.  GASTROINTESTINAL: Complaining of nausea. Denies vomiting, diarrhea, abdominal pain.  GENITOURINARY: No dysuria or hematuria.  ENDOCRINE: Denies polyuria or nocturia. Complaining of hypertension. HEMATOLOGIC AND LYMPHATIC: No anemia, easy bruising or bleeding.  INTEGUMENTARY: No acne, rash, lesions.  MUSCULOSKELETAL: No joint pain in the neck. Has chronic low back pain. NEUROLOGIC: Denies vertigo, ataxia.  PSYCHIATRIC: No ADD or OCD.   PHYSICAL EXAMINATION:  VITAL SIGNS: Temperature 98.5, pulse 67, respirations 18, blood pressure 157/75, pulse oximetry 94% on 2 liters.  GENERAL APPEARANCE: Not under acute distress. Moderately built and obese.  HEENT: Normocephalic, atraumatic. Pupils are equally  reacting to light and accommodation. No scleral icterus. No conjunctival injection. No sinus tenderness. Extraocular movements intact. Moist mucous membranes.  NECK: Supple. No JVD. No thyromegaly. No lymphadenopathy. Range of motion is intact. No JVD.  LUNGS: Moderate air entry. Positive rales.   CARDIAC: S1, S2 normal. Regular rate and rhythm. No murmurs.  GASTROINTESTINAL: Soft, obese. Bowel sounds are positive in all 4 quadrants. Nontender, nondistended. No hepatosplenomegaly. No masses felt.  NEUROLOGIC: Awake, alert, oriented x3. Cranial nerves II through XII are grossly intact. Motor and sensory are intact. Reflexes are 2+.  EXTREMITIES: Trace edema is present. No cyanosis. No clubbing.  SKIN: Warm to touch. Normal turgor. No rashes. No lesions.  MUSCULOSKELETAL: No joint effusion, tenderness, erythema.  PSYCHIATRIC: Normal mood and affect.   LABORATORY AND IMAGING STUDIES: Troponin initially 0.05, repeat is 0.06. WBC 5.9, hemoglobin 13.6, hematocrit 40.7, platelets are 72,000. Glucose 103, BUN 32, creatinine 1.93, sodium 140, potassium 3.4, chloride 107, CO2 29, anion gap 4, GFR 39, serum osmolality 283, calcium 8.8. BNP 3215. Portable chest x-ray has revealed cardiac enlargement. A 12-lead EKG: Normal sinus rhythm at 72 beats per minute, possible left atrial enlargement, nonspecific ST-T wave changes.   ASSESSMENT AND PLAN: An 54 year old male coming into the ER with a chief complaint of shortness of breath and chest pain. Will be admitted with the following assessment and plan.   1. Malignant hypertension and new-onset congestive heart failure. Will admit him to telemetry bed. Will resume his home medication of metoprolol, hydralazine and clonidine. Will provide him Lasix IV in view new-onset congestive heart failure. Echocardiogram is ordered. Cardiology consult is placed to Dr. Juliann Paresallwood. Will cycle cardiac biomarkers to rule out acute myocardial infarction. The patient will be on acute coronary syndrome protocol with oxygen, nitroglycerin p.r.n., aspirin, beta blocker and statin.  2. Chest pain with detectable troponin, could be from demand ischemia from malignant hypertension. Will continue close monitoring.  3. Hypertension. Resume his home medications. The patient is  noncompliant with his home medications. Will put a consult to case management and social worker regarding this. The patient is to follow up with a primary care physician on a regular basis. This needs to be addressed by the case management.  4. Chronic renal insufficiency. Will avoid nephrotoxins and monitor renal function closely. If necessary, as the patient is on Lasix, will consider nephrology consult.  5. Tobacco dependence. The patient is counseled to quit smoking. He will be on nicotine patch. 6. Will provide gastrointestinal prophylaxis.  7. Deep vein thrombosis prophylaxis with SCDs in view of thrombocytopenia.   CODE STATUS: The patient is full code.   Diagnosis and plan of care were discussed in detail with the patient and his friend at bedside. They all verbalized understanding of the plan.   TOTAL TIME SPENT ON ADMISSION: 45 minutes.    ____________________________ Ramonita LabAruna Dorthula Bier, MD ag:lb D: 08/19/2013 03:54:19 ET T: 08/19/2013 06:50:34 ET JOB#: [id number]  cc: Ramonita LabAruna Daymein Nunnery, MD, <Dictator> Ramonita LabARUNA Shaleta Ruacho MD ELECTRONICALLY SIGNED 08/23/2013 3:00

## 2014-12-15 NOTE — H&P (Signed)
PATIENT NAME:  Gary MonarchMARLEY, Eon B MR#:  [id number] DATE OF BIRTH:  10/01/1960  DATE OF ADMISSION:  03/17/2014  REFERRING PHYSICIAN: Dr. Dierdre SearlesShevitz.   FAMILY PHYSICIAN: None.   REASON FOR ADMISSION: Progressive shortness of breath with accelerated hypertension.   HISTORY OF PRESENT ILLNESS: The patient is a 54 year old male with a history of COPD/tobacco abuse, benign hypertension and chronic back pain due to degenerative disk disease. He continues to smoke. Does not seek medical attention and is on no medications. He presents to the Emergency Room with progressive shortness of breath and lower extremity swelling. Continues to complain of chronic back pain. In the Emergency Room, the patient was noted to have labored respirations with accelerated hypertension. He did have peripheral edema. Chest x-ray suggested some underlying mild CHF and his BNP was elevated. Denies any cardiac history. He is now admitted for further evaluation.   PAST MEDICAL HISTORY:  1. Obesity.  2. COPD/tobacco abuse.  3. Benign hypertension.  4. Migraine headaches.  5. Degenerative disk disease.  6. Chronic back pain.  7. History of hepatitis B.   MEDICATIONS: None.   ALLERGIES: MORPHINE.   SOCIAL HISTORY: The patient continues to smoke less than a half-pack per day. No history of alcohol abuse.   FAMILY HISTORY: Positive for hypertension, but negative for coronary artery disease, stroke or diabetes.   REVIEW OF SYSTEMS:  CONSTITUTIONAL: No fever. No change in weight.  EYES: No blurred or double vision. No glaucoma.  ENT: No tinnitus or hearing loss. No nasal discharge or bleeding. No difficulty swallowing.  RESPIRATORY: The patient has had cough productive of gray sputum. Some wheezing. No hemoptysis. No painful respiration.  CARDIOVASCULAR: Occasional chest pain which he attributes to his back pain. No palpitations or syncope.  GASTROINTESTINAL: No nausea, vomiting or diarrhea. No abdominal pain. No change in  bowel habits.  GENITOURINARY: No dysuria or hematuria. No incontinence.  ENDOCRINE: No polyuria or polydipsia. No heat or cold intolerance.  HEMATOLOGIC: The patient denies anemia, easy bruising or bleeding.  LYMPHATIC: No swollen glands.  MUSCULOSKELETAL: The patient has pain in his back, but denies pain in his neck, shoulders, knees or hips. No gout.  NEUROLOGIC: No numbness. Denies stroke or seizures.  PSYCHOLOGICAL: The patient denies anxiety, insomnia or depression.   PHYSICAL EXAMINATION:  GENERAL: The patient is obese, chronically ill-appearing, in mild respiratory distress.  VITAL SIGNS: Currently remarkable for a blood pressure of 181/111, heart rate 70, respiratory rate 20, temperature 97.2, saturation 100% on 2 liters.  HEENT: Normocephalic, atraumatic. Pupils equally round and reactive to light and accommodation. Extraocular movements are intact. Sclerae are nonicteric. Conjunctivae are clear. Oropharynx is clear.  NECK: Supple without JVD. No adenopathy or thyromegaly is noted.  LUNGS: Reveal diffuse rhonchi and basilar rales. No wheezes. No dullness. Respiratory effort is mildly increased.  CARDIAC: Regular rate and rhythm with a normal S1 and S2. There is a 2/6 systolic murmur noted. No rubs or gallops are present. PMI is nondisplaced. Chest wall is nontender.  ABDOMEN: Soft, nontender, with normoactive bowel sounds. No organomegaly or masses were appreciated. No hernias or bruits were noted.  EXTREMITIES: Revealed 2+ edema bilaterally with stasis changes. Pulses were 2+ bilaterally.  SKIN: Warm and dry without lesions. Psoriasis-like rash was noted on the knees.  NEUROLOGIC: Revealed cranial nerves II through XII grossly intact. Deep tendon reflexes were symmetric. Motor and sensory exams nonfocal.  PSYCHIATRIC: Revealed a patient who was alert and oriented to person, place and time. He was cooperative  and used good judgment.   LABORATORY DATA: EKG revealed sinus rhythm at 64  beats per minute with lateral T wave inversion. Chest x-ray revealed cardiomegaly with bibasilar interstitial opacities. Lower extremity Dopplers were negative for DVT. His white count was 5.5 with a hemoglobin of 12.5. Glucose was 116 with a BUN of 19, creatinine of 2.11 and a GFR of 34. Sodium 140 with a potassium of 3.8. Troponin 0.05.   ASSESSMENT:  1. Chronic obstructive pulmonary disease exacerbation.  2. Presumed bronchitis.  3. Accelerated hypertension.  4. Acute diastolic congestive heart failure.  5. Stage III chronic kidney disease.  6. Anemia of chronic disease.  7. Obesity.  8. Degenerative disk disease with chronic back pain.  9. Abnormal electrocardiogram.   PLAN: The patient will be admitted to telemetry as a full code. He will be started on aspirin, heparin, oral nitrates and beta blocker therapy. Will use IV Lasix for diuresis. Will follow serial cardiac enzymes and obtain an echocardiogram. Will also consult cardiology because of his occasional chest pain, CHF and abnormal EKG. For his pulmonary disease, will begin IV antibiotics with IV steroids and DuoNeb SVNs. Will supplement oxygen as needed. Will encourage him to stop smoking. Will consult pulmonology and follow up a chest x-ray in the morning. THE PATIENT IS ALLERGIC TO MORPHINE. Will use Norco as needed for pain. Will follow his sugars while on steroids. Will optimize his blood pressure regimen. Follow up routine labs in the morning. He is on a 2 gram sodium diet. Further treatment and evaluation will depend upon the patient's progress.   TOTAL TIME SPENT ON THIS PATIENT: 50 minutes.   ____________________________ Duane Lope Judithann Sheen, MD jds:lb D: 03/17/2014 11:51:03 ET T: 03/17/2014 12:04:08 ET JOB#: [id number]  cc: Duane Lope. Judithann Sheen, MD, <Dictator> Bostyn Bogie Rodena Medin MD ELECTRONICALLY SIGNED 03/18/2014 10:59

## 2014-12-15 NOTE — Consult Note (Signed)
PATIENT NAME:  Gary Juarez, Gary Juarez MR#:  [id number] DATE OF BIRTH:  [birth year]  DATE OF CONSULTATION:  08/19/2013  REFERRING PHYSICIAN:  Dr. Amado Coe CONSULTING PHYSICIAN:  Rudi Knippenberg D. Sintia Mckissic, MD  INDICATION:  Chest pain, shortness of breath.   HISTORY OF PRESENT ILLNESS:  The patient is a 54 year old white male with past history of hypertension, chronic renal insufficiency, anasarca with proteinuria with low albumin in the past, chronic kidney disease, tobacco abuse, presented to the ER with chest pain, shortness of breath.  The patient reported that he has not been taking any of his blood pressure medicines for at least a year because of financial problems.  The patient stated the above and chest pressure in the left side of his chest radiating to his shoulder with shortness of breath.  He has had symptoms   chronic.  He felt nauseous.  Denies any vomiting.  No dizziness, no loss of consciousness.  In the ER pressure was 148.  The patient was given 10 mg labetalol x 2 and Dilaudid for pain.  The patient's blood pressure eventually came down a little and pulse is 167.  The patient reported EMS gave him four baby aspirin on the way which did not seem to help his pain, but after Dilaudid was given he said he had some improvement.  He has had cardiac stress done in 1990, which was normal.  The patient is supposed to see cardiologist outpatient, but did not follow up in the past year.    PAST MEDICAL HISTORY:  Hypertension, chronic renal insufficiency, chronic low back pain, DJD, tobacco abuse, hypertension, hepatitis B.   PAST SURGICAL HISTORY:  None.   ALLERGIES:  MORPHINE, VICODIN, OXYCODONE.   FAMILY HISTORY:  Metastatic bone cancer, coronary artery disease, PCI and stent.   SOCIAL HISTORY:  Lives alone, smokes half a pack a day, has been smoking for 30 years.  No alcohol consumption.  No illicit drug use.   MEDICATIONS:  Metoprolol 50 twice a day, Lasix once a day, hydralazine 50 2 times a day,  clonidine 0.2 twice a day.   REVIEW OF SYSTEMS:  Denies blackout spells or syncope.  No fever.  No chills.  No sweats.  No weight gain or weight gain, hemoptysis, hematemesis, bright red blood per rectum.  No vision change or hearing change.  Denies sputum production, cough.  He has had some nausea, but no vomiting.  He has had some chest pain with some shortness of breath as well as noncompliance.   PHYSICAL EXAMINATION: VITAL SIGNS:  Blood pressure 157/75, pulse 70, respiratory rate 16, afebrile.  HEENT:  Normocephalic, atraumatic.  Pupils equal, reactive to light.  NECK:  Supple.  No significant JVD, bruits or adenopathy.  LUNGS:  Clear to auscultation and percussion.  No significant wheezing, rhonchi, or rale.  HEART:  Regular rate and rhythm.  ABDOMEN:  Positive bowel sounds.  No rebound, guarding, or tenderness.  EXTREMITIES:  Within normal limits.  NEUROLOGIC:  Intact.  SKIN:  Normal.  LABORATORY DATA:  Troponin 0.05.  White count 5.9, hemoglobin 13.6, hematocrit 40.7, platelet count of 72, glucose at 103, BUN of 32, creatinine 1.93, sodium 140, potassium 3.4, chloride 107, CO2 29.  BNP 3215.  Chest x-ray, enlargement of the cardiac, otherwise normal.  EKG:  Normal sinus rhythm, left atrial enlargement, nonspecific ST-T wave changes.   ASSESSMENT: 1.  Malignant hypertension.  2.  Chest pain, possible angina.  3.  Smoking.  4.  Renal insufficiency.  5.  Mild hypoxemia.  6.  Thrombocytopenia.  7.  History of hepatitis B.  8.  Degenerative joint disease.  9.  Chronic lower back pain.   PLAN:  1.  Agree with admit.   2.  Rule out myocardial infarction.  3.  Agree for follow-up telemetry, EKGs and troponins.  4.  Consider functional study or direct therapy with medications.   5.  Continue to treat pain which has some atypical features.  6.  For malignant hypertension, continue hypertension control, resume his outpatient medications.  7.  Because of noncompliance and financial  issues, control and maintain a reasonable blood pressure.  8.  Advised the patient to quit smoking. 9.  Inhalers as necessary for hypoxemia, shortness of breath.  10.  Renal insufficiency, to be followed up with nephrologist and refrain from nephrotoxic drugs.   11.  Consider with a statin therapy as necessary.   12.  Continue therapy for lower back pain.  We will refrain from narcotic use for now and try to control the pain with some other fashion.  Would recommend possible echocardiogram and possible functional study, both of which may be able to be done as an outpatient, have the patient follow up with me in 1 to 2 weeks if he is allowed to be discharged.  If his pain continues or blood pressure becomes difficult, we will consider whether further evaluation as an inpatient is necessary.    ____________________________ Bobbie Stackwayne D. Juliann Paresallwood, MD ddc:ea D: 08/22/2013 23:17:50 ET T: 08/23/2013 00:21:41 ET JOB#: [id number]  cc: Kerem Gilmer D. Juliann Paresallwood, MD, <Dictator> Alwyn PeaWAYNE D Grasiela Jonsson MD ELECTRONICALLY SIGNED 09/21/2013 16:22

## 2014-12-15 NOTE — Consult Note (Signed)
PATIENT NAME:  Gary MonarchMARLEY, Gary Juarez MR#:  [id number] DATE OF BIRTH:  31-Jul-1961  CARDIOLOGY CONSULTATION   DATE OF CONSULTATION:  03/17/2014  CONSULTING PHYSICIAN:  Laurier NancyShaukat A. Dorothey Oetken, MD  INDICATION FOR THE CONSULTATION: Shortness of breath and swelling of the legs.   HISTORY OF PRESENT ILLNESS: This is a 54 year old white male with a past medical history of COPD, hypertension, degenerative disk disease, who presented to the Emergency Room with progressive shortness of breath, swelling of the legs. He says his right leg has been swollen more than the last 4 years, but lately, it got really bad and was hurting in his legs along with severe shortness of breath, PND, orthopnea, and thus he decided to come to the Emergency Room.   PAST MEDICAL HISTORY: He has a history of obesity, COPD, benign hypertension, migraine headache, degenerative disk disease, chronic back pain, history of hepatitis Juarez.   MEDICATIONS AT HOME: None.   ALLERGIES: MORPHINE.   SOCIAL HISTORY: Smokes less than a half-pack per day. No EtOH abuse.   FAMILY HISTORY: Positive for hypertension, coronary artery disease, stroke and diabetes.   PHYSICAL EXAMINATION:  GENERAL: He is alert and oriented x3. He is morbidly obese.  VITAL SIGNS: His blood pressure was elevated to 181/111, heart rate 70, respirations 20, temperature 97.2, saturation 100% at 2 liters.  NECK: Revealed positive JVD.  LUNGS: Good air entry. No rales or rhonchi.  HEART: Normal S1, S2. No audible murmur.  ABDOMEN: Soft, nontender, positive bowel sounds.  EXTREMITIES: 2+ pedal edema.  NEUROLOGICAL: The patient appears to be intact.   DIAGNOSTIC STUDIES: EKG shows normal sinus rhythm, 64 beats per minute, T wave inversion in V5 to V6, with some ST elevation 0.5 mm in lead III and aVF. His labs look unremarkable. Troponins are negative. Chest x-ray shows mild cardiomegaly. Dopplers of the legs: No DVT. Cardiac enzymes are negative.   ASSESSMENT AND PLAN: The  patient has chronic obstructive pulmonary disease, history of anemia and degenerative disk disease, abnormal electrocardiogram. The patient is being admitted with a diagnosis of congestive heart failure, most likely the cause of swelling in the legs and shortness of breath, along with chronic obstructive pulmonary disease. Agree with working diagnosis. Will get echocardiogram to evaluate his ejection fraction. Agree with giving Lasix 40 q.12, hydralazine, isosorbide, losartan and metoprolol. His BUN is slightly elevated at 19, and creatinine is 2.11, so will have to be careful how much Lasix he patient is given, but will make further recommendation after looking at the patient's echocardiogram.   Thank you very much for referral.   ____________________________ Laurier NancyShaukat A. Antoinette Haskett, MD sak:lb D: 03/17/2014 14:28:46 ET T: 03/17/2014 14:49:46 ET JOB#: [id number]  cc: Laurier NancyShaukat A. Terral Cooks, MD, <Dictator> Laurier NancySHAUKAT A Diesel Lina MD ELECTRONICALLY SIGNED 03/29/2014 12:02

## 2014-12-15 NOTE — Discharge Summary (Signed)
PATIENT NAME:  Gary Juarez, Gary Juarez MR#:  [id number] DATE OF BIRTH:  [birth year]  DATE OF ADMISSION:  05/13/2014 DATE OF DISCHARGE:  05/14/2014  ADMITTING DIAGNOSIS: Acute respiratory distress due to congestive heart failure and chronic obstructive pulmonary disease  chronic obstructive pulmonary disease exacerbation.   DISCHARGE DIAGNOSES: 1.  Acute respiratory distress. 2.  Chronic obstructive pulmonary disease exacerbation. 3.  Acute bronchitis.  4.  Acute on chronic diastolic congestive heart failure, mostly right-sided, with lower extremity as well as abdominal wall edema.  5.  Suspected obstructive sleep apnea.  6.  Elevated troponin, likely demand ischemia. No acute coronary syndrome.  7.  Chronic kidney disease. 8.  Obesity with LDL of 80.  9.  Ongoing tobacco abuse.  10.  Medical noncompliance. 11.  Bradycardia to beta blockers.   DISCHARGE CONDITION: Stable.   DISCHARGE MEDICATIONS:  1.  The patient is to start prednisone taper at 50 mg p.o. once on the 27th of September 2015 and then taper x 10 mg until stopped.  2.  Doxycycline monohydrate 100 mg p.o. twice daily.  3.  Isosorbide mononitrate 30 mg daily.  4.  Aspirin 81 mg daily.  5.  Advair Diskus 250/50 one puff twice daily.  6.  Tiotropium 1 inhalation once daily. 7.  Combivent Respimat 1 puff 4 times daily as needed.  8.  Amlodipine 10 mg p.o. daily.  9.  Furosemide 40 mg p.o. daily.  10.  Nicotine transdermal patch 14 mg topically daily.  11.  Catapres 0.1 mg p.o. twice daily.  HOME OXYGEN: None.   DIET: 2 grams salt, low-fat, low-cholesterol, regular consistency.   ACTIVITY LIMITATIONS: As tolerated.  FOLLOWUP APPOINTMENTS: Follow up with the [facility] in 2 days after discharge, Dr. Adrian Blackwater in 2 days after discharge. Also, the patient was recommended to follow up with the heart failure clinic on the 5th of October 2015.  CONSULTANTS: Care management, social work, Dr. Adrian Blackwater.   RADIOLOGIC  STUDIES: Chest x-ray, PA and lateral, 20th of September 2015, revealed vascular congestion with increased pulmonary interstitial markings raising concern for recurrent pulmonary edema. Small bilateral pleural effusions were also noted.   HISTORY AND HOSPITAL COURSE: The patient is a 54 year old Caucasian male with past medical history significant for history of diastolic CHF who is a chronic smoker, presented to the hospital with complaints of shortness of breath. Apparently, the patient has been feeling shortness of breath for the past 1 week, however, it worsened 3 or 4 days prior to admission. He was also having discomfort in his chest, feeling like his heart was fluttering and some tightness in his chest, also some discomfort in the left arm. He also admitted of having 5 to 6 pillow orthopnea. Due to tachypnea and oxygen requirement in the Emergency Room, since his O2 sats were low, the patient was admitted. His initial vitals: Temperature was 100.7, pulse was 69, respirations 22, blood pressure 231/123 and O2 was 94% on room air. Physical exam revealed faint rales at bases bilaterally, tachypneic. The patient did have lower extremity edema all the way to umbilical region.   The patient's lab data, on admission to the hospital, showed elevated beta-type natriuretic peptide of 3245, otherwise BMP was unremarkable. The patient's calcium level was low at 8 and estimated GFR for non-African American would be 33. The patient's first troponin was mildly elevated at 0.08, however, the second as well as third and fourth troponins were within normal limits. TSH was normal at 0.66. White  blood cell count was normal at 6.4, hemoglobin was 12.1, and platelet count was 73,000. Blood cultures taken on the 28th of September 2015 did not show any growth. Urinalysis was remarkable for 3 red blood cells, 2 white blood cells, negative for nitrites or leukocyte esterase, and urine cultures did not show any growth. Chest x-ray  revealed mild congestive heart failure.   The patient was admitted to the hospital for further evaluation. He was initiated on diuretics as well as on steroids, inhalers and antibiotics for his renal failure. Of note, the patient's creatinine level done on admission, the 20th of September 2015, was 2.19. The patient was diuresed and continued on antibiotics and inhalers. With this his condition improved and he felt much more comfortable. His blood pressure was also managed with multiple blood pressure medications. He was seen by cardiologist, Dr. Adrian Blackwater, who felt the patient may have unstable angina and questionable coronary artery disease. He recommended cardiac catheterization as well as echocardiogram done; however, the patient refused both of those studies.  First, in regards to acute respiratory distress, it was felt that the patient's acute respiratory distress was related to both COPD exacerbation as well as CHF.   In regards to COPD exacerbation, the patient was advised to continue antibiotic therapy for 7 more days, also inhalation therapy and steroid taper. He was weaned off oxygen.   In regards to acute on chronic diastolic CHF, it was felt that the patient's diastolic CHF could have been exacerbated by undiagnosed obstructive sleep apnea as well as poor compliance with blood pressure medications. The patient's blood pressure medications were reinstated and social  workers were consulted to give him 3 medications. The patient was diuresed and it is recommended to continue diuresis at home. He was recommended to follow up with his primary care physician to establish primary physician at the [facility] and follow his creatinine levels with diuresis as well as oxygenation and lower extremity swelling.   In regards to suspected obstructive sleep apnea, the patient was recommended sleep study as outpatient; however, he expressed concerns about financial issues and I am not absolutely  convinced that he will be willing to comply with this recommendation.   In regards to elevated troponin, since the patient's first troponin was elevated, however the second and third as well as fourth were within normal limits, it was felt that the patient's troponin was very likely just demand ischemia and no acute coronary syndrome; however, again, the patient refused cardiac catheterization recommended by cardiologist, Dr. Adrian Blackwater. The patient is however to follow up with Dr. Adrian Blackwater if willing in the next 1 week after discharge.   In regards to CKD, the patient's kidney function was poor when he arrived to the hospital. His creatinine was 2.19 with estimated GFR of 33. The patient's creatinine with diuresis remained about the same with level of 2.26 on the day of discharge, 21st of September 2015, with GFR of 32. It is recommended to follow the patient's creatinine level and GFR as outpatient and refer him to a nephrologist if he is agreeable.   The patient was obese. Again, lipid panel was checked while he was in the hospital and LDL was found to be surprisingly good at 80. The patient's total cholesterol was 134. The patient's triglycerides were 48 and the patient's HDL was 44. The patient however still was recommended to continue low-fat, low-cholesterol diet and if possible lose weight. He was also recommended to get nocturnal oximetry  study done to rule out obstructive sleep apnea. His TSH was checked and was found to be normal.   In regards to ongoing tobacco abuse, the patient was counseled extensively and nicotine replacement therapy was initiated for him, although he was not willing to comply with this.  The patient was noted to be bradycardic with beta blockers. For this reason, he was not initiated on any beta blockers since his heart rate dropped down to 30s during sleep while he was on beta blockers. I suspect the patient's bradycardia could have been also obstructive sleep apnea  related.   For malignant hypertension, the patient's blood pressure medications were advanced to current levels watching carefully for cheap medications, possibly 4 to 10 dollar range in [employer], and on the day of discharge, 21st of September 2015, the patient's blood pressure was a little bit better controlled. His temperature was 97.6, pulse was 70s to 90s, respiration rate was 18, blood pressure ranging from 148 systolic to 174 systolic and 70s to 90s diastolic. O2 sats were 94% on room air at rest.   TIME SPENT: 40 minutes.  ____________________________ Katharina Caperima Shaleta Ruacho, MD rv:sb D: 05/15/2014 07:00:01 ET T: 05/15/2014 08:18:14 ET JOB#: [id number]  cc: Katharina Caperima Garnet Chatmon, MD, <Dictator> [facility] Laurier NancyShaukat A. Khan, MD [facility] Scarlette Hogston MD ELECTRONICALLY SIGNED 05/31/2014 13:38

## 2014-12-15 NOTE — Discharge Summary (Signed)
PATIENT NAME:  Gary Juarez, Gary Juarez DATE OF BIRTH:  [birth year]  DATE OF ADMISSION:  03/17/2014 DATE OF DISCHARGE:  03/18/2014  ADMISSION DIAGNOSES:  1. Chronic obstructive pulmonary disease exacerbation. 2. Congestive heart failure exacerbation.  DISCHARGE DIAGNOSES: 1. Chronic obstructive pulmonary disease exacerbation. 2. Acute diastolic congestive heart failure exacerbation.  3. Stage 3 chronic kidney disease.  4. Obesity.  5. Noncompliance.   CONSULTATIONS:  1. Dr. Adrian BlackwaterShaukat Khan.  2. Dr. Belia HemanKasa.   LABORATORIES AT DISCHARGE: White blood cells 9.9, hemoglobin 12.2, hematocrit 37, platelets are 99,000, sodium 136, potassium 4.4, chloride 103, bicarbonate 25, BUN is 27, creatinine 2.37, glucose is 172.   ECHO: normal EF no major valvular abnormalities  HOSPITAL COURSE: This is a 54 year old male who presented with shortness of breath, lower extremity edema noted to have an elevated BNP, chest x-ray concerning for congestive heart failure, as well as some wheezing which led to chronic obstructive pulmonary disease exacerbation. For further details, please refer to H and P.  1. Chronic obstructive pulmonary disease exacerbation. The patient was treated with antibiotics and steroids. He did well from this perspective. On discharge, he actually has no wheezing. Dr. Belia HemanKasa did see the patient in consultation. He will be discharged with a very quick steroid taper and his inhalers were labeled for home use. He is an obese male and snores at night so I do suspect he has underlying obstructive sleep apnea and will need a sleep evaluation.  2. Congestive heart failure exacerbation, diastolic in nature. Dr. Welton FlakesKhan was consulted. The patient was diuresed. He was diuresed about 2 liters and was not was complaining of any shortness of breath. The lower extremity edema had improved.  3. Hypertension The patient was initially started on losartan, Imdur, and metoprolol. However, due to his renal  issues I have held the losartan and he will need metoprolol and Imdur with close followup.  4. Smoking dependence. The patient was encouraged to stop smoking. He was counseled for 3 minutes but did not want a nicotine patch and was not at all interested in quitting smoking.  5. Peripheral edema secondary to problem number 2. His Dopplers were negative for deep venous thrombosis. 6. Acute kidney injury versus chronic kidney disease. His creatinine in the past has been about anywhere between 2 to 2.3, so he has chronic kidney disease stage III. His creatinine needs to be monitored closely as an outpatient and I did discuss this with the patient.   DISCHARGE MEDICATIONS:  1. Imdur 60 mg p.o. b.i.d.  2. Aspirin 81 mg daily.  3. Metoprolol 25 mg daily.  4. Doxycycline 100 mg q. 12 hours.  5. Lasix 40 mg daily.  6. Prednisone taper starting at 50 mg tapered by 10 mg every 2 days.  7. The patient was also discharged with his inhaler that he used here, albuterol 2 puffs q. 4 hours p.r.n. and Advair 250/50 b.i.d.   DISCHARGE DIET: Low sodium diet.   DISCHARGE ACTIVITY: As tolerated.   FOLLOWUP: We had our case manager help with medications as well at discharge planning. The patient is to be referred to [facility].    TIME 40 minutes  ____________________________ Ellenora Talton P. Juliene PinaMody, MD spm:lt D: 03/18/2014 12:22:20 ET T: 03/18/2014 22:47:33 ET JOB#: [id number]  cc: Krisi Azua P. Juliene PinaMody, MD, <Dictator> [facility] Zaylei Mullane P Coree Brame MD ELECTRONICALLY SIGNED 03/19/2014 13:04

## 2014-12-15 NOTE — H&P (Signed)
PATIENT NAME:  Gary Juarez, ATOR MR#:  [id number] DATE OF BIRTH:  [birth year]  DATE OF ADMISSION:  05/13/2014  REFERRING PHYSICIAN:  Loreli Dollar, MD.  PRIMARY CARE PHYSICIAN:  Nonlocal.   ADMISSION DIAGNOSIS:  Acute on chronic congestive heart failure.   HISTORY OF PRESENT ILLNESS:  This is a 54 year old Caucasian male who presents to the Emergency Department after feeling feverish. The patient has felt short of breath for the last week but his breathing has worsened over the last 3-4 days. He also admits to a vague sensation of chest pain that he describes as more consistent with "fluttering." He mentions also some pain in his left arm that is chronic but is now more intense. He endorses 5-6-pillow orthopnea, as well. Due to this tachypnea and oxygen requirement the Emergency Department staff called for admission.   REVIEW OF SYSTEMS:  CONSTITUTIONAL: The patient admits to fever and fatigue.  EYES: The patient denies blurred vision or inflammation.  EARS, NOSE AND THROAT: Denies tinnitus or sore throat.  RESPIRATORY: Admits to shortness of breath, as well as intermittent cough and dyspnea on exertion. CARDIOVASCULAR: Admits to vague chest pain as well as orthopnea and edema. He denies palpitations.  GASTROINTESTINAL: Denies nausea and vomiting, but admits to some loose stools.  GENITOURINARY: Denies dysuria, increased frequency or hesitancy.  ENDOCRINE: The patient denies polyuria or polydipsia.  HEMATOLOGIC AND LYMPHATIC:  Denies easy bruising or bleeding.  INTEGUMENT: Admits to chronic rash, but no skin lesions.  MUSCULOSKELETAL: Denies arthralgias, myalgias, but admits to chronic back pain. NEUROLOGIC: Denies numbness or weakness in his extremities, but admits to headache.  PSYCHIATRIC: Denies depression or suicidal ideation.   PAST MEDICAL HISTORY: COPD, congestive heart failure, hypertension, migraines, degenerative disk disease, back pain and hepatitis B.   PAST SURGICAL HISTORY:   Dental surgery.   FAMILY HISTORY:  Coronary artery disease in his father, his mother is deceased of breast cancer.  Hypertension runs throughout the family.   SOCIAL HISTORY: The patient still smokes. He is down to approximately 1 pack per week now. He has smoked for 33 years. He does not drink or do any drugs.   MEDICATIONS: Taken from most recent discharge summary.   1.  Imdur 60 mg 1 tablet p.o. b.i.d.  2.  Aspirin 81 mg 1 tablet p.o. daily.  3.  Metoprolol 25 mg 1 tablet daily.  4.  Doxycycline 100 mg 1 tablet p.o. b.i.d.  5.  Lasix 40 mg 1 tablet p.o. daily.  6.  Prednisone taper starting with 50 mg and decreasing by 10 mg every 2 days.  7.  Albuterol 2 puffs every 4 hours as needed for coughing, wheezing, or shortness of breath.  8.  Advair 250/50 mg 1 puff b.i.d.   ALLERGIES: MORPHINE.   PERTINENT LABORATORY RESULTS AND RADIOGRAPHIC FINDINGS:  Serum glucose is 94, BNP 3245, BUN 17, creatinine 2.19, calcium is 8, troponin is 0.08. White blood cell count is normal at 6.4 hemoglobin is 12.1, hematocrit is 36.8.   Urine is negative for infection.   Chest x-ray shows increased vascular congestion and interstitial markings raising concern for pulmonary edema. There are small bilateral pleural effusions noted.   PHYSICAL EXAMINATION:  VITAL SIGNS: Temperature is 100.7, pulse 69, respirations 22, blood pressure originally 231/123, now 155/94, pulse oximetry 94% on room air. The patient has been placed on nasal cannula to achieve oxygen saturations greater than 88% with a goal of 92%, because he desaturates with periodic movement.   GENERAL: The  patient is alert and oriented x3, in no apparent distress.  HEENT: Normocephalic, atraumatic. Pupils equal, round, and reactive to light and accommodation. Extraocular movements are intact. Mucous membranes are moist.  NECK: Trachea is midline. No adenopathy.  CHEST: Symmetric and atraumatic.  CARDIOVASCULAR:  Heart sounds somewhat distant.  Regular rate and rhythm. Normal S1, S2. No rubs, clicks, or murmurs appreciated.  LUNGS: There are faint rales at the bases bilaterally. There is normal excursion but the patient is comfortably tachypneic.  ABDOMEN: Positive bowel sounds. Soft, nontender. Fairly distended and tight with nonpitting edema in the lower quadrants bilaterally. There is no hepatosplenomegaly.  GENITOURINARY: Deferred.  MUSCULOSKELETAL: The patient moves all 4 extremities equally, has 5/5 strength in his upper and lower extremities bilaterally.  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  EXTREMITIES: No clubbing or cyanosis but there is 1+ pitting edema of his lower shins and nonpitting edema all the way up to his umbilical region.  NEUROLOGIC: Cranial nerves II-XII are grossly intact.  PSYCHIATRIC: Mood is normal. Affect is congruent.   ASSESSMENT AND PLAN: This is a 54 year old male with acute on chronic congestive heart failure.  1.  Congestive heart failure.  His BNP is elevated over his previous admission for congestive heart failure. His most recent echocardiogram from July 2015 shows a preserved left ventricular ejection fraction but pseudo normal diastolic filling of the left ventricle as well as a severely increased posterior wall of the left ventricle. Today's chest x-ray is consistent with vascular congestion and pulmonary edema. Uncontrolled hypertension may be the etiology of this current episode. He has received 40 mg of IV Lasix in the Emergency Department. We will continue his home dose of 40 mg of oral Lasix in the morning and I have added 20 mg of Lasix in the evening to achieve a comfortable fluid balance.  2.  Chronic obstructive pulmonary disease. The patient appears to be in a mild exacerbation now, as he is tachypneic with some intermittent cough. Hyperinflation and increased thoracic pressures may explain his current troponin leak. He does not have  any ischemic changes to his EKG.  He was given cefepime in the Emergency Department. I will continue his antibiotic coverage with respiratory fluoroquinolone. I have also continued his Advair and albuterol.  3.  Sepsis. The patient meets criteria with fever and tachypnea. The original working diagnosis in the Emergency Department was pneumonia but his  exam is more consistent with fluid overload than lung consolidation. We will treat with the fluoroquinolone for now, but blood cultures have been obtained and if the patient worsens clinically we will certainly add vancomycin.  4.  Hypertension. We will continue the patient's Imdur, but I have cut his beta blocker dose in half and added some hydralazine to his regimen as we would like to avoid ACE inhibitors and calcium channel blockers as they are not good in patients with chronic kidney disease and congestive heart failure, respectively.  5.  Chronic kidney disease. Manage hypertension.  6.  Hepatitis B. 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  7.  Tobacco abuse.  Will do Nicoderm patches.  8.  DVT prophylaxis with heparin.  9.  GI prophylaxis with pantoprazole.   The patient is a full code.   Time spent on admission orders and patient care: Approximately 45 minutes.    ____________________________ Kelton PillarMichael S. Sheryle Hailiamond, MD msd:lt D: 05/13/2014 04:51:37 ET T: 05/13/2014 07:54:29 ET JOB#: [id number]  cc:  Kelton Pillar. Sheryle Hail, MD, <Dictator> Kelton Pillar Tonjua Rossetti MD ELECTRONICALLY SIGNED 05/13/2014 23:55

## 2014-12-15 NOTE — Consult Note (Signed)
PATIENT NAME:  Gary MonarchMARLEY, Gary Juarez MR#:  [id number] DATE OF BIRTH:  1961/01/25  DATE OF CONSULTATION:  05/13/2014  REFERRING PHYSICIAN:   CONSULTING PHYSICIAN:  Laurier NancyShaukat A. Zury Fazzino, MD  INDICATIONS FOR CONSULTATION:  Congestive heart failure, elevated troponin.   HISTORY OF PRESENT ILLNESS: This is a 54 year old white male with a past medical history of renal insufficiency, who presented to the Emergency Room with shortness of breath, PND, orthopnea, leg swelling, and chest pain, associated with some fluttering in the chest.   PAST MEDICAL HISTORY: History of congestive heart failure, migraine, degenerative disk disease, back pain.   FAMILY HISTORY: Positive for coronary artery disease.  His father has coronary artery disease and hypertension.   SOCIAL HISTORY: Still smokes 1 pack per day. He has smoked for 33 years.   MEDICATIONS: Imdur, aspirin, metoprolol, Lasix.   ALLERGIES: None.   PHYSICAL EXAMINATION:  GENERAL: He is alert, oriented x 3, in no acute distress.  VITAL SIGNS: Vitals are stable.  NECK: Positive JVD.  LUNGS: There is crepitation at the bases.  HEART: Regular rate and rhythm. Normal S1, S2. No audible murmur.  ABDOMEN: Soft, nontender, positive bowel sounds.  EXTREMITIES: 1+ pedal edema.   DIAGNOSTIC AND LABORATORY DATA:  His EKG shows sinus rhythm, 81 beats per minute, nonspecific ST-T changes.  His troponin is 0.08, BUN 17, creatinine 2.19, potassium 3.16. His BNP is 3245.  His chest x-ray shows vascular congestion.   ASSESSMENT AND PLAN: The patient has a mildly elevated troponin. There are no acute EKG changes. Occasional chest pain, also presents with congestive heart failure. His prior echo had normal left ventricular systolic function. Concern is coronary artery disease as his father has coronary artery disease and he has multiple risk factors for coronary artery disease as the cause of his congestive heart failure. He has been put on Lasix, hydralazine, isosorbide,  and aspirin. Advise repeating an echocardiogram again.  I will probably do renal protection and do cardiac catheterization tomorrow. Thank you very much for the referral.    ____________________________ Laurier NancyShaukat A. Legna Mausolf, MD sak:nr D: 05/13/2014 11:02:43 ET T: 05/13/2014 11:15:17 ET JOB#: [id number]  cc: Laurier NancyShaukat A. Takenya Travaglini, MD, <Dictator> Laurier NancySHAUKAT A Nyra Anspaugh MD ELECTRONICALLY SIGNED 05/31/2014 9:55

## 2014-12-23 NOTE — Discharge Summary (Signed)
PATIENT NAME:  Gary Juarez, Gary Juarez MR#:  [id number] DATE OF BIRTH:  02-25-1961  DATE OF ADMISSION:  11/16/2014 DATE OF DISCHARGE:  11/18/2014  ADMITTING DIAGNOSES:  1.  Hypoxia.  2.  Abdominal pain.   DISCHARGE DIAGNOSES:  1.  Acute on chronic respiratory failure with hypoxia and hypercapnia.  2.  Acute on chronic diastolic congestive heart failure.  3.  Malignant essential hypertension.  4.  Hypertensive encephalopathy.  5.  Migraine headaches.  6.  Obesity, suspected obesity hypoventilation syndrome/obstructive sleep apnea. The patient qualified for BiPAP, refused.  7.  History of hypertension, chronic kidney disease stage 3, hepatitis B, chronic obstructive pulmonary disease, migraine headaches, degenerative disk disease, tobacco abuse.   DISCHARGE CONDITION: Stable.   DISCHARGE MEDICATIONS: The patient is to continue ProAir HFA 2 puffs 4 times daily as needed, Meclizine 25 mg 3 times daily as needed, Zofran 4 mg 4 times daily as needed, trazodone 100 mg p.o. at bedtime, Bidil 37.5/20 mg 3 times daily, acetaminophen/hydrocodone 325 mg/5 mg 1 tablet every 4 hours as needed, prednisone taper  40 mg p.o. once, then taper x 10 mg every 1 day until stopped, metoprolol 25 mg twice daily, budesonide/formoterol 160/4.5 two 2 puffs twice daily, tiotropium 1 inhalation once daily, amlodipine 10 mg p.o. daily, Lasix 40 mg p.o. twice daily. The patient is not to take Coreg. Home health with physical therapy.   HOME OXYGEN: At 2 liters of oxygen per nasal cannula with portable tank 24/7.   DIET: A 2 gram salt, low-fat, low-cholesterol, regular consistency.   ACTIVITY LIMITATIONS: As tolerated. The patient is referred to cardiac rehabilitation.  FOLLOWUP APPOINTMENT: With [facility] in 2 days after discharge.   CONSULTANTS: Care management; social work; cardiologist,  Kelby Fam. Dorena Cookey, PA-C; Dionisio David, MD; Melody Haver E. Raul Del, MD, pulmonary physician.   RADIOLOGIC STUDIES: Chest x-ray PA and  lateral 11/16/2014 showed peribronchial cuffing suggesting mild pulmonary edema, suspect small right pleural effusion, cardiomegaly is unchanged. Correlation recommended for CHF. Abdomen flat and erect 11/17/2014 revealed normal bowel gas pattern. No obstruction or perforation. CT scan of abdomen and pelvis without contrast 11/17/2014 revealed atherosclerotic changes in the aorta and iliac arteries without aneurysm. Assessment for dissection was limited without IV contrast. No bowel obstruction, no abscess. Appendix appears normal. Nonobstructing 2 mm calculus in the right kidney. Kidneys showed extensive scarring bilaterally. No well-defined renal mass. Assessment for masses with attenuation similar to  renal parenchyma is limited on this noncontrast enhanced study. The contour of the liver suggest underlying cirrhosis. No focal liver lesions were identified beyond a cyst in the left lower medial segment. The sensitivity of noncontrast CT for noncystic mass lesions in the liver is limited. Pleural effusions bilaterally, slightly larger on the left than on the right, minimal pericardial effusion, question a degree of anasarca in the abdominal wall, probable cellulitis along the lower pelvic wall anteriorly, sigmoid diverticulosis without diverticulitis. Chest portable, single view x-ray 11/17/2014 revealed vascular congestion and mild cardiomegaly with mildly increased interstitial markings likely reflecting mild pulmonary edema, small bilateral pleural effusions suspected. Ultrasound of bilateral lower extremities: Doppler ultrasound showed no deep vein thrombosis in bilateral lower extremities. Left arm Doppler ultrasound showed no evidence of deep vein thrombosis in the left upper extremity.    HISTORY OF PRESENT ILLNESS: The patient is a 54 year old Caucasian male with past medical history significant for history of obesity, history of hypertension, CKD, CHF, COPD, migraines, degenerative disk disease, and  ongoing tobacco abuse, presented to the hospital  on 11/16/2014 with complaints of abdominal pains, low back pains, headaches, multiple problems, and he was found to have hypoxia in the Emergency Room. Please refer to Dr. Tinnie Gens admission note on 11/16/2014. On arrival to the hospital, the patient's temperature was 98.1, pulse was 72, respiratory rate was 18, blood pressure 225/103, saturation was 95% on 2 liters of oxygen through nasal cannula. Physical exam was unremarkable except for lower extremity swelling, as well as abdominal wall swelling.    The patient's lab data done on arrival to the hospital showed an elevated glucose level of 110, brain natriuretic peptide was 933, creatinine was 2.28. Otherwise BMP was unremarkable. The patient's liver enzymes showed albumin level of 3.0, otherwise liver enzymes were unremarkable. Troponin was elevated at 0.04 on the first set, 0.03 on the second. TSH was normal at 2.842. White blood cell count was normal at 4.5, hemoglobin was 11.7, and platelet count was 73,000. Absolute neutrophil count was 2.9, which was within normal limits. Urinalysis was unremarkable. ABGs were performed on 11/17/2014, showed pH of 7.29, pCO2 was 61, pO2 was 52, saturation was 89.2% on room air. EKG on arrival revealed normal sinus rhythm at 81 beats per minute, T wave abnormality consider lateral ischemia, prolonged QTc to 480 msec, but no significant change since prior EKG done on 11/01/2014.   HOSPITAL COURSE: The patient was admitted to the hospital for further evaluation. Because of significantly elevated blood pressure, he was initiated on lots of blood pressure medications including IV. He also had a CT scan of abdomen done to rule out aortic aneurysm or possibly dissection; however, we were not able to use contrast for the CT scan due to his kidney failure. CT, however, showed no aneurysmal dilatation or any other abnormalities. The patient's abdominal pain also resolved with  pain medications as well as enema. When he started passing gas he felt much more comfortable. It was felt that the patient's abdominal pain was very likely constipation.   In regards to malignant hypertension, the patient was treated and his blood pressure improved. However, the patient developed acute on chronic diastolic CHF because of elevated blood pressure requiring diuresis. It was felt that patient's acute and now on chronic respiratory failure with hypoxia and hypercapnia was very likely due to acute on chronic diastolic CHF. He was treated with oxygen and requirement for home oxygen was also checked and he met oxygen for home requirement. Consultation with Dr. Raul Del, pulmonologist, was obtained because of hypoxia and concerns that the patient has obesity hypoventilation syndrome, as well as possibly obstructive sleep apnea. The patient was recommended to use BiPAP; however, he did not tolerate BiPAP in the hospital and refused to use it at home although he qualified. We initiated the patient on oxygen and discharged him on oxygen at home.   In regards to his acute on chronic diastolic CHF, he is to continue metoprolol, Norvasc, as well as diuretics. He is to follow up with [facility] for further recommendations in regards to management of his fluid overload.   For hypertensive encephalopathy, the patient's encephalopathy resolved with blood pressure management, but also it was felt that the patient's headache was very likely due to obstructive sleep apnea which is not treated and refused to be treated. The patient was advised, however, to followup with primary care physician and get sleep studies as outpatient if possible. He may benefit from obstructive sleep apnea surgical therapy since he is noncompliant with CPAP or BiPAP.   In regards to  migraine headaches, it was unclear if, in fact, the patient's migraine headaches were hypertension related, as well as possibly nocturnal hypoxia related  since they occur in the morning and disappeared with blood pressure management and oxygenation.   In regards to his CKD, the patient's kidney function was followed while he was in the hospital and remained relatively stable; however, the patient's estimated GFR dipped down because of  significantly elevated blood pressure readings which were noted on admission. The patient's estimated GFR was 25 on the day of discharge, 11/18/2014 with creatinine level of 2.75, which is the patient's baseline.   The patient is being discharged in relatively stable condition with the above-mentioned medications and follow-up. In summary, the patient came in with hypoxia, diffuse abdominal, back pain, headaches, and other kind of pains. It was felt that the patient's hypoxia was related to CHF. The patient was treated with diuretics, as well as blood pressure management. His blood pressure was much improved on the day of discharge; however, the patient still requires quite a lot of supervision for blood pressure management. He was felt to  benefit from BiPAP; however, he refused BiPAP after trying it in the hospital. The patient may benefit from obstructive sleep apnea surgical therapy overall to help him with his medical disabilities he has. The patient, meanwhile, is to continue oxygen, as well as blood pressure medications.   On the day of discharge, the patient's temperature was 98.5, pulse was 67 to 91, respirations were 17 to 18, blood pressure 336 systolic to 122 systolic, diastolic was 44L to 75P, O2 saturations were 95% on 2 liters of oxygen through nasal cannula at rest. The patient's O2 saturations, however dipped down to 87% on exertion on room air on 11/17/2014, and improved to 93% on 2 liters of oxygen with exertion.   TIME SPENT: 40 minutes.    ____________________________ Theodoro Grist, MD rv:TT D: 11/19/2014 12:37:48 ET T: 11/20/2014 01:05:18 ET JOB#: [id number]  cc: Theodoro Grist, MD,  <Dictator> [place]   Dawson Albers MD ELECTRONICALLY SIGNED 12/02/2014 15:29

## 2014-12-23 NOTE — H&P (Signed)
PATIENT NAME:  Gary Juarez, Camp B MR#:  [id number] DATE OF BIRTH:  December 06, 1960  DATE OF ADMISSION:  11/02/2014  REFERRING PHYSICIAN: Chiquita LothJade Sung, MD   PRIMARY CARE PHYSICIAN: [facility].   ADMISSION DIAGNOSES: Malignant hypertension and wheezing.   HISTORY OF PRESENT ILLNESS: This is a 54 year old male who presents to the Emergency Department complaining of the heaviness in his chest and back pain that radiates into his left leg. The patient states that he has been progressively worsening in all 3 of the aforementioned symptoms since visiting his primary care doctor recently. At that time, he states that he was feeling so weak due to the medications he had been taking that he was having to crawl from his bed to the bathroom at night. He attributed this to the strength of his blood pressure medicines. Thus, he has not taken any medicines in 24 hours. This was partly due to the decision made in clinic to reduce his Lasix dose by half. Here in the Emergency Department he presented with some wheezing that resolved with some breathing treatments. He admits to some chest pressure, but denies any pain. He currently does not have any nausea, but he admits to having vomited once in the last few days. His orthopnea is improved, but he states that he sleeps much better on his left side. The patient can walk approximately 15-20 feet without getting short of breath. Due to his uncontrolled hypertension as well as his intermittent tachypnea in the Emergency Department, the Emergency Department staff called for admission.   REVIEW OF SYSTEMS:  CONSTITUTIONAL: The patient denies fever, but admits to generalized weakness.  EYES: Denies blurred vision or inflammation.  EARS, NOSE AND THROAT: Denies tinnitus or sore throat.  RESPIRATORY: Denies cough, but admits to wheezing as well as shortness of breath.  CARDIOVASCULAR: Admits to chest pressure. The patient has 2-pillow orthopnea. He denies palpitations  but admits to dyspnea on exertion.  GASTROINTESTINAL: Denies nausea at present. He denies abdominal pain or diarrhea. The patient admits to heartburn. GENITOURINARY: Denies dysuria, increased frequency or hesitancy of urination.  ENDOCRINE: Denies polyuria or polydipsia.  HEMATOLOGIC AND LYMPHATIC: Denies easy bruising or bleeding.  INTEGUMENTARY: Denies rashes or lesions.  MUSCULOSKELETAL: The patient admits to chronic lower back pain that "worsens in power surges." The pain radiates down his left leg. He denies arthralgias.  NEUROLOGIC: Denies numbness and tingling in his extremities as well as dysarthria.  PSYCHIATRIC: Denies depression or suicidal ideation.   PAST MEDICAL HISTORY: Hypertension, chronic kidney disease, congestive heart failure, hepatitis B, COPD, migraines, degenerative disk disease, and tobacco abuse.   PAST SURGICAL HISTORY: Dental surgery.    SOCIAL HISTORY: The patient lives alone. He has smoked for the last 33 years. He smokes 1-2 cigarettes per day at this time. He does not drink alcohol or do any drugs.   FAMILY HISTORY: The patient's father has coronary artery disease and his mother is deceased of breast cancer. There hypertension throughout members of the family.   MEDICATIONS:  1.  Aspirin 81 mg 1 tablet p.o. daily.  2.  Carvedilol 25 mg 1 tablet p.o. b.i.d.  3.  Clonidine 0.3 mg 1 tablet p.o. t.i.d.  4.  Furosemide 40 mg 1 tablet p.o. b.i.d.  5.  Potassium chloride 20 mEq extended release 1 tablet p.o. daily.  6.  ProAir high-flow inhaler 90 mcg/inhalations 2 puffs inhaled 4 times a day as needed for shortness of breath.   ALLERGIES: MORPHINE.   PERTINENT LABORATORY  RESULTS AND RADIOGRAPHIC FINDINGS: Serum glucose is 151. BNP is 1732. BUN 28, creatinine 2.49, serum sodium 140, potassium 3.8, chloride 108, bicarbonate 26, calcium is 8.7. Troponin is 0.06. White blood cell count is 4.9, hemoglobin is 12.3, hematocrit 37.9, platelet count is 85,000, MCV is 89.  Chest x-ray shows cardiac enlargement. There is no evidence of active pulmonary disease.   PHYSICAL EXAMINATION:  VITAL SIGNS: Temperature is 98.1, pulse 82, respirations 18, blood pressure 204/111, pulse oximetry is 95% on room air.  GENERAL: The patient is alert and oriented x 3 in no apparent distress.  HEENT: Normocephalic, atraumatic. Pupils equal, round, and reactive to light and accommodation. Extraocular movements are intact. Mucous membranes are moist.  NECK: Trachea is midline. No adenopathy. Thyroid nonpalpable, nontender.  CHEST: Symmetric and atraumatic.  CARDIOVASCULAR: Regular rate and rhythm. Normal S1, S2. No rubs, clicks, or murmurs. There is hyperdynamic precordium; the point of maximal impulse is slightly displaced to the left.  LUNGS: Clear to auscultation bilaterally. Normal effort and excursion.  ABDOMEN: Positive bowel sounds. Soft, nontender, nondistended. There is no hepatosplenomegaly. The patient is morbidly obese.  GENITOURINARY: Deferred.  MUSCULOSKELETAL: The patient moves all 4 extremities equally. He has 5/5 strength in his upper and lower extremities bilaterally.  SKIN: Warm and dry. There are no rashes or lesions.  EXTREMITIES: There is no clubbing or cyanosis. The patient does have significant nonpitting edema of the lower extremities to the knees.  NEUROLOGIC: Cranial nerves II-XII are grossly intact.  PSYCHIATRIC: Mood is normal. Affect is congruent. The patient seems to have excellent insight into his illness. His judgment into his medical condition is questionable.   ASSESSMENT AND PLAN: This is a 53 year old male with malignant hypertension and wheezing.  1.  Hypertension, currently uncontrolled. Systolic blood pressure upon arrival to the Emergency Department was 230. I have placed 2 inches of nitroglycerin paste on the patient's chest and instructed the nursing staff to give the patient's morning dose of medicines early, as he has not had any medication  and at least 24 hours. His troponin is mildly elevated likely due to demand ischemia secondary to uncontrolled hypertension. We will continue to trend his cardiac biomarkers.  2.  Chronic obstructive pulmonary disease. The patient was wheezing upon arrival to the Emergency Department, but this has resolved following breathing treatments in the Emergency Department. He is still intermittently tachypneic, but this may be a combination of deconditioning, diastolic heart failure, as well as obesity hypoventilation and/or body habitus. We will continue the patient's outpatient inhaler regimen.  3.  Chronic kidney disease. The patient's kidney function is improving since his last admission to the hospital. He currently has stage IV chronic kidney disease. Our goal currently is to manage the patient's hypertension, which will hopefully result in some improvement in his renal function.  4.  Congestive heart failure. The patient has diastolic dysfunction. His BNP is relatively improved compared to his last hospital admission. We will continue his dose of Lasix per his home regimen. His lungs are currently clear and he has no hypoxia.  5.  Chronic back pain. This is multifactorial. We will manage his pain symptomatically.  6.  Hepatitis B. This is stable.  7.  Tobacco abuse. I will give the patient a NicoDerm patch while in the hospital.  8.  Obesity. The patient's BMI is 44.7. I have encouraged a healthy diet as well as moderate exercise. His body habitus likely contributes to his back pain as well.  9.  Deep  vein thrombosis prophylaxis. Heparin.  10.  Gastrointestinal prophylaxis. Pantoprazole.   CODE STATUS: The patient is a full code.   TIME SPENT ON ADMISSION ORDERS AND PATIENT CARE: Approximately 35 minutes.    ____________________________ Kelton Pillar. Sheryle Hail, MD msd:bm D: 11/02/2014 04:53:06 ET T: 11/02/2014 05:10:00 ET JOB#: [id number]  cc: Kelton Pillar. Sheryle Hail, MD, <Dictator> Kelton Pillar Treana Lacour  MD ELECTRONICALLY SIGNED 11/05/2014 3:39

## 2014-12-23 NOTE — Consult Note (Signed)
PATIENT NAME:  Gary MonarchMARLEY, Shiheem B MR#:  [id number] DATE OF BIRTH:  1961/04/24  DATE OF CONSULTATION:  11/27/2014  REFERRING PHYSICIAN:   CONSULTING PHYSICIAN:  Laurier NancyShaukat A. Kyanne Rials, MD  INDICATION FOR CONSULTATION: Shortness of breath and swelling of the legs.   HISTORY OF PRESENT ILLNESS: This is a 54 year old white male who has repeatedly been admitted to the hospital with CHF who is noncompliant with his medication.  Presented to the hospital with apparently periorbital swelling and swelling of the legs. He has occasional chest pain, but his main complaint was shortness of breath and swelling.  An x-ray was done in the Emergency Room which showed questionable pneumonia.   PAST MEDICAL HISTORY: History of COPD, CHF, chronic back pain, hepatitis B, hypertension.   ALLERGIES: MORPHINE.  FAMILY HISTORY: Positive for hypertension, coronary artery disease, cancer.   SOCIAL HISTORY: He smokes about 1/2 pack per day. No ETOH abuse.   PHYSICAL EXAMINATION:  GENERAL: He is alert and oriented x 3 in no acute distress right now.  VITAL SIGNS: Blood pressure is 135/76, respirations are 20, pulse is 52, temperature 97.6, saturation 95.  NECK: Positive JVD.  LUNGS: A few crepitations at the bases.  HEART: Regular rate and rhythm. Normal S1, S2. No audible murmur.  ABDOMEN: Soft, nontender, positive bowel sounds.  EXTREMITIES: 1+ pedal edema.  NEUROLOGIC: He appears to be intact.   LABORATORY DATA:  Shows white count of 6.3, hemoglobin 11.3, hematocrit 35.1, platelet count 87,000. His troponin is less than 0.03.  INR is 1.2.  X-ray shows a right lower lobe pneumonia.   ASSESSMENT AND PLAN: The patient has history of congestive heart failure, chronic kidney disease and presented with angioedema.  Was given Solu-Medrol. Also has right-sided pneumonia and is being treated for that.  Cardiac point of view, his EKG just shows sinus bradycardia, no acute changes. Cardiac enzymes are negative. No further cardiac  workup is necessary. Thank you very much for the referral.   ____________________________ Laurier NancyShaukat A. Gregery Walberg, MD sak:sp D: 11/27/2014 08:52:49 ET T: 11/27/2014 08:59:33 ET JOB#: [id number]  cc: Laurier NancyShaukat A. Amadea Keagy, MD, <Dictator> Laurier NancySHAUKAT A Megan Hayduk MD ELECTRONICALLY SIGNED 12/04/2014 13:33

## 2014-12-23 NOTE — Discharge Summary (Signed)
PATIENT NAME:  Gary Juarez, Gary Juarez MR#:  [id number] DATE OF BIRTH:  October 11, 1960  DATE OF ADMISSION:  10/26/2014 DATE OF DISCHARGE:  10/27/2014  The patient left against medical advice on 10/27/2014.   BRIEF HOSPITAL COURSE: This is a 54 year old male with a history of chronic kidney disease stage III, history of CHF, hypertension, chronic back pain who presented to the hospital with back and chest pain and noted to have an elevated troponin and noted to be in acute on chronic renal failure. The patient was admitted to the hospital for treatment for his back pain, his renal failure, and his chest pain but he left against medical advice on the morning of 10/26/2013.   TIME SPENT: 30 minutes.    ____________________________ Rolly PancakeVivek J. Cherlynn KaiserSainani, MD vjs:AT D: 11/02/2014 15:03:58 ET T: 11/02/2014 23:50:01 ET JOB#: [id number]  cc: Rolly PancakeVivek J. Cherlynn KaiserSainani, MD, <Dictator> [place] SirenVIVEK J SAINANI MD ELECTRONICALLY SIGNED 11/05/2014 14:35

## 2014-12-23 NOTE — H&P (Signed)
PATIENT NAME:  Gary MonarchMARLEY, [name] MR#:  [id number] DATE OF BIRTH:  1960/12/11  DATE OF ADMISSION:  10/26/2014   PRIMARY CARE PHYSICIAN: Does not have one.   CHIEF COMPLAINT: Chest, back pain.   HISTORY OF PRESENT ILLNESS: This is a 54 year old male who presents to the hospital due to worsening back pain over the past week to 10 days. The patient has chronic back pain after some injury a couple of years ago. The patient's pain has been progressively getting worse over the past few months, although for the past few days it has been significantly worse where he has not been able to get out of bed.  He, therefore came to the ER for further evaluation. The patient also complains of some mild chest pain, but nonradiating to the jaw or to the neck area, associated with some shortness of breath, even at rest and some nausea, but no vomiting.  The patient presented to the Emergency Room, was noted to be in acute on chronic renal failure, also noted to have a mildly elevated troponin at 0.16.  Hospitalist services were contacted for further treatment and evaluation.   REVIEW OF SYSTEMS:  CONSTITUTIONAL: No documented fever. No weight gain, no weight loss.  EYES: No blurred or double vision.  ENT: No tinnitus. No postnasal drip. No redness of the oropharynx.  RESPIRATORY: No cough, no wheeze or hemoptysis. Positive dyspnea.  CARDIOVASCULAR: Positive chest pain. No orthopnea, no palpitations, no syncope.  GASTROINTESTINAL: Positive nausea but no vomiting or diarrhea. No abdominal pain. No melena or hematochezia.  GENITOURINARY:  No dysuria or hematuria.  ENDOCRINE: No polyuria or nocturia. No heat or cold intolerance.  HEMATOLOGY: No anemia no bruising, no bleeding.  INTEGUMENTARY: No rashes. No lesions.  MUSCULOSKELETAL: No arthritis, no swelling, no gout.  NEUROLOGIC: No numbness, tingling. No ataxia. No seizure-type activity.  PSYCHIATRIC: No anxiety, no insomnia, no ADD disorder.   PAST MEDICAL  HISTORY: Consistent with obesity, history of diastolic congestive heart failure, chronic kidney disease stage 3, hypertension, chronic back pain.   ALLERGIES: MORPHINE WHICH CAUSES A HEADACHE.   SOCIAL HISTORY: Still smokes a few cigarettes per day, has been smoking for the past 20+ years. No alcohol abuse. No illicit drug abuse. Lives by himself.   FAMILY HISTORY:  The patient's mother is deceased, died from breast cancer. Father is alive has hypertension and coronary disease.   CURRENT MEDICATIONS: As follows: Tylenol with hydrocodone 5/300, at 1 tablet b.i.d., aspirin 81 mg daily,  Coreg 25 mg b.i.d., clonidine 0.3 mg t.i.d., Lasix 40 mg b.i.d., potassium 20 mEq daily, albuterol inhaler 2 puffs 4 times daily as needed.   PHYSICAL EXAMINATION: Presently is as follows:  VITAL SIGNS: Temperature is 98.2, pulse 57, respirations 18, blood pressure 196/100, sats 92% on room air. GENERAL: He is an unkempt-appearing male, but in no apparent distress.  HEAD, EYES, EARS, NOSE AND THROAT: He is atraumatic, normocephalic. Extraocular muscles are intact.  Pupils are equal and reactive to light.  Sclerae anicteric. No conjunctival injection. No pharyngeal erythema.  NECK: Supple. There is no jugular venous distention. No bruits, no lymphadenopathy, no thyromegaly.  HEART: Regular rate and rhythm. No murmurs, no rubs, no clicks.  LUNGS: Clear to auscultation bilaterally. He has prolonged inspiratory and expiratory phase, negative use of accessory muscles. No dullness to percussion.  ABDOMEN: Soft, flat, nontender, nondistended. Has good bowel sounds. No hepatosplenomegaly appreciated.  EXTREMITIES: No evidence of any cyanosis, clubbing, or peripheral edema. Has +2 pedal and radial  pulses bilaterally.  NEUROLOGICAL: The patient is alert and oriented x 3 with no focal motor or sensory deficits appreciated bilaterally.  SKIN: Moist and warm with no rashes appreciated. LYMPHATIC:  There is no cervical or  axiallary lymphadenopathy.   LABORATORY DATA: Serum glucose of 187, BUN 51, creatinine 3.6, sodium 139, potassium 3.1, chloride 100, bicarbonate 34, troponin 0.16. White cell count 7.1, hemoglobin 15.3, hematocrit 46, platelet count 110.   The patient did have a chest x-ray done which showed cardiomegaly without failure.   ASSESSMENT AND PLAN: This is a 54 year old male with history of obesity, history of congestive heart failure, chronic kidney disease stage 3, hypertension, chronic back pain presents to the hospital due to back and chest pain, progressively getting worse, noted to have an elevated troponin and also noted to be in acute on chronic renal failure.  1.  Chest pain with elevated troponin. The patient does have risk factors given his obesity, tobacco abuse and hypertension, although his EKG just shows left ventricular hypertrophy with voltage criteria but no acute ST or T wave changes. I will observe him on off unit telemetry, follow serial cardiac markers. Continue his aspirin and, beta blocker. We will get a cardiology consult. The patient has been seen by Dr. Welton Flakes before.  2.  Back pain. This is chronic but has gotten worse. He has had no recent trauma. I will get an x-ray of his thoracic spine, give him Dilaudid as needed for pain along with Norco as needed for pain and also Valium.  Follow him clinically.  3.  Acute on chronic renal failure. This is likely prerenal renal in nature. I will hold his Lasix, hydrate him with IV fluids.  Follow his BUN and creatinine. 4.  Thrombocytopenia. This seems chronic in nature. The patient has no acute bleeding.  I will avoid heparin products, follow his platelet count.   5.  Malignant hypertension. I suspect this is probably due to medical noncompliance.  I will continue his Coreg and clonidine for now, add some p.r.n. hydralazine.   CODE STATUS: The patient is a FULL CODE.   TIME SPENT ON ADMISSION:  50 minutes      ____________________________ Rolly Pancake. Cherlynn Kaiser, MD vjs:DT D: 10/26/2014 16:03:50 ET T: 10/26/2014 16:41:25 ET JOB#: [id number]  cc: Rolly Pancake. Cherlynn Kaiser, MD, <Dictator> Houston Siren MD ELECTRONICALLY SIGNED 11/02/2014 10:37

## 2014-12-23 NOTE — H&P (Signed)
PATIENT NAME:  Gary Juarez, Gary Juarez MR#:  [id number] DATE OF BIRTH:  [birth year]  DATE OF ADMISSION:  11/16/2014  REFERRING PHYSICIAN: Marjean Donna, MD  PRIMARY CARE PHYSICIAN: [place]   ADMISSION DIAGNOSIS: Hypoxia as well as multifactorial pain.   HISTORY OF PRESENT ILLNESS: This is a 54 year old Caucasian male who presents to the Emergency Department complaining of lower back pain, abdominal pain and headache. The latter started the morning prior to admission. It began as a frontal headache and then became more global radiating down his neck as well as left side of his back. The patient states that the severity of the headache gradually increased over the day and became more similar to his migraines, with which he is familiar. The abdominal pain began later in the day. He has had 3 episodes of nonbloody, nonbilious vomiting. His back pain is chronic, but seems to be worse the more he vomits. He had some shortness of breath when he was lying flat for periods of time longer than 30 minutes thus he has been sleeping in a recliner. In the Emergency Department, the patient was observed and remained in pain for a good deal of time. He also intermittently required some supplemental oxygen which prompted the Emergency Department to call for admission.   REVIEW OF SYSTEMS: CONSTITUTIONAL: The patient denies fever or weakness.  EYES: Denies blurred vision or inflammation.  EARS, NOSE AND THROAT: Denies tinnitus or sore throat.  RESPIRATORY: Denies cough but admits to some shortness of breath.  CARDIOVASCULAR: Denies chest pain or palpitations but admits to orthopnea when lying flat and on occasion paroxysmal nocturnal dyspnea.  GASTROINTESTINAL: Admits to nausea and vomiting as well as abdominal pain, but denies diarrhea.  GENITOURINARY: Denies dysuria, increased frequency or hesitancy of urination.  ENDOCRINE: Denies polyuria or polydipsia. HEMATOLOGIC AND LYMPHATIC: Denies  easy bruising or bleeding.  INTEGUMENTARY: Denies rashes or lesions.  MUSCULOSKELETAL: Denies myalgias but admits to arthralgias, particularly in his back.  NEUROLOGIC: Denies numbness in his extremities or dysarthria.  PSYCHIATRIC: Denies depression or suicidal ideation.   MEDICATIONS: 1.  Acetaminophen with hydrocodone 325 mg/5 mg one tablet p.o. every 6 hours as needed for moderate pain.  2.  Antivert 25 mg 1 tablet p.o. t.i.d. as needed for dizziness.  3.  BiDil 3.75/20 mg tablets 1 tablet p.o. 3 times a day.  4.  Carvedilol 3.125 mg 1 tablet p.o. b.i.d.  5.  Pro Air high fluoroscopy inhaler 90 mcg/inhalation 2 puffs inhaled 4 times a day as needed for shortness of breath.  6.  Trazodone 100 mg 1 tablet p.o. at bedtime.   ALLERGIES: MORPHINE.  PAST MEDICAL HISTORY: Hypertension, chronic kidney disease, congestive heart failure, hepatitis B, COPD, migraines, degenerative disk disease and tobacco abuse.   PAST SURGICAL HISTORY: Dental surgery.   SOCIAL HISTORY: The patient lives alone. He has smoked for the last 33 years but has been trying to cut back. He does not drink alcohol or do any drugs.   FAMILY HISTORY: Significant for coronary artery disease in his father and his mother is deceased of breast cancer. There are multiple members of the family with hypertension.   PERTINENT DIAGNOSTIC DATA: Serum glucose 110. BNP 933. BUN 19, creatinine 2.28, serum sodium 142, potassium 4.2, chloride 109, bicarb 26, calcium 8.6. Lipase 37. Serum albumin 3, alk phos 106, AST 21, ALT 13. Troponin 0.04. White blood cell count 4.5, hemoglobin 11.7, hematocrit 36.7, platelet count 73,000, MCV 91.   Urinalysis shows 100  mg protein per deciliter but is otherwise normal.  PHYSICAL EXAMINATION: VITAL SIGNS: Temperature 98.1, pulse 72, respirations 18, blood pressure 225/103, pulse ox 95% on 2 liters of oxygen via nasal cannula. GENERAL: The patient is alert and oriented x3.  HEENT: Normocephalic,  atraumatic. Pupils equal, round, and reactive to light and accommodation. Extraocular movements are intact. Mucous membranes are moist.  NECK: Trachea is midline. No adenopathy. Thyroid nonpalpable, nontender.  CHEST: Symmetric and atraumatic. CARDIOVASCULAR: Regular rate and rhythm. Normal S1, S2. No rubs, clicks, or murmurs appreciated.  LUNGS: Clear to auscultation bilaterally. Normal effort and excursion.  ABDOMEN: Positive bowel sounds. Soft. Diffusely tender. No hepatosplenomegaly. There is no rebound tenderness.  GENITOURINARY: Deferred.  MUSCULOSKELETAL: The patient moves all 4 extremities equally, however, I have not seen him walk. He reportedly is able to transfer from his seating arrangements to other rooms of his house albeit with significant difficulty.  SKIN: There are no rashes or lesions. It is warm and dry.  EXTREMITIES: No clubbing or cyanosis, but there is edema of the lower extremities to the thigh. NEUROLOGIC: Cranial nerves II through XII are grossly intact.  PSYCHIATRIC: Mood is normal. Affect is congruent. The patient has good judgment into his medical condition.   ASSESSMENT AND PLAN: A 54 year old male admitted for hypoxia, back pain, abdominal pain and nausea all of which could be part of an abdominal migraine syndrome.  1.  Hypoxia. This is definitely intermittent. The patient had some pulmonary edema seen on chest x-ray, although this must be minimal as he has clear lung for right now. The patient has a history of noncompliance, but his fluid may be due to some change in his medication regimen or his diet. 2.  Abdominal pain. Differential diagnosis includes viral gastroenteritis or medication changes or administration on an empty stomach that may have caused abdominal pain and nausea. The patient seems to have musculoskeletal pain at this time as it is predominantly the lower portion of his abdomen that is more edematous, somewhat like his ankles. The patient has a  nonsurgical abdomen at this moment. We will manage his nausea symptomatically. I have started him on a clear diet for now and may advance as tolerated.  3.  Headache. This is possibly a migraine type headache. At this time, it is supportive of essential viral cause of systemic illness.  4.  Chronic kidney disease. The patient's creatinine is actually improving. He is now barely within stage III renal failure.  5.  Congestive heart failure. The patient reportedly has diastolic dysfunction. His BNP is significantly better than on his previous admission and his lungs are clear. We will continue BiDil.  6.  Chronic back pain. The patient has sciatic pain of the left lower extremity and is also bothered by his right hip. The pain is likely multifactorial. We will continue to manage symptomatically. 7.  Chronic obstructive pulmonary disease. The patient is intermittently tachypneic. Right now he does not appear to be septic and thus does not need antibiotics or taper of steroids. 8.  Tobacco abuse. I will prescribe the patient a NicoDerm patch. 9.  Hepatitis B. Stable.  10.  Deep venous thrombosis prophylaxis with Heparin.  11.  Gastrointestinal prophylaxis with pantoprazole due to his appetite restrictions for now.   CODE STATUS: The patient is a FULL code.   TIME SPENT ON ADMISSION ORDERS AND PATIENT CARE: Approximately 40 minutes.  ____________________________ Norva Riffle. Marcille Blanco, MD msd:sb D: 11/16/2014 10:48:04 ET T: 11/16/2014 11:09:33 ET JOB#: [id number]  cc: Norva Riffle. Marcille Blanco, MD, <Dictator> Norva Riffle Plato Alspaugh MD ELECTRONICALLY SIGNED 11/17/2014 1:37

## 2014-12-23 NOTE — Discharge Summary (Signed)
PATIENT NAME:  Gary Juarez, ATTIG MR#:  [id number] DATE OF BIRTH:  [birth year]  DATE OF ADMISSION:  11/04/2014 DATE OF DISCHARGE:  11/06/2014  DISCHARGE DIAGNOSES: 1. Accelerated hypertension.  2. Acute renal failure over chronic kidney disease stage 3.  3. Chronic obstructive pulmonary disease exacerbation.  4. Chronic diastolic congestive heart failure.  5. Morbid obesity.  6. Elevated troponin due to hypertension.  7. Depression.  8. Chronic tinnitus and dizziness.  9. Noncompliance.   DISCHARGE MEDICATIONS: 1. Lasix 40 mg 2 times a day.  2. Aspirin 81 mg daily.  3. Potassium chloride 20 mEq daily.  4. ProAir HFA 2 puffs inhaled 4 times a day as needed.  5. Coreg 3.125 mg oral 2 times a day.  6. Ventolin HFA 2 puffs inhaled every 4 hours as needed.  7. Lidocaine 5% topical film apply topically daily.  8. Norvasc 2.5 mg daily.  9. Acetaminophen hydrocodone 325/5, one tablet every 6 hours as needed for pain.  10. Antivert 25 mg oral 3 times a day as needed for dizziness.  11. Zofran 4 mg, 4 times a day as needed for nausea or vomiting.  12. MiraLax 17 grams oral once a day.  13. Trazodone 100 mg oral once a day.   DISCHARGE INSTRUCTIONS: Low sodium, low fat, low cholesterol diet. Activity as tolerated. Follow up with Dr. Thedore Mins of nephrology and [facility] in a week. The patient will need a referral to psychiatry from his primary care physician's office.   The patient has also been counseled to quit smoking and be compliant with his medications.   IMAGING STUDIES: Include: 1. A chest x-ray PA and lateral, showed no evidence of acute disease.  2. Lower extremity Doppler showed no DVT.  3. Ultrasound of the bilateral kidneys showed increased renal cortical echotexture consistent with medical renal disease, left kidney  significantly smaller than the right. No mass, or obstruction noticed.   CONSULTANTS:   1.  Dr. Wynelle Link and Dr. Thedore Mins of nephrology.  2.  Dr. Welton Flakes with  cardiology.   PROCEDURES: Echocardiogram showed ejection fraction of 60-65% with diastolic dysfunction.   ADMITTING HISTORY AND PHYSICAL: Please see detailed H and P dictated by Dr. Sheryle Hail. In brief, a 54 year old morbidly obese Caucasian male patient with history of hypertension, chronic obstructive pulmonary disease, tobacco abuse, chronic tinnitus, presented to the hospital complaining of some shortness of breath and elevated blood pressure. The patient was found to have accelerated hypertension with systolic greater than 200, mildly elevated troponin, admitted to hospitalist service.   HOSPITAL COURSE: 1. Accelerated hypertension. The patient's blood pressure quickly returned to normal on restarting his home medications of clonidine 0.3 t.i.d. and his Coreg 50 b.i.d.  Also, Imdur was added but on restarting these medications, the patient's blood pressure plummeted down into the low 80s systolic. The patient started feeling dizzy. His clonidine was held. Coreg has been cut down significantly. He also got bradycardic due to the medications. Although the patient mentioned that he was compliant with his medications, on restarting his home medications, his blood pressure was extremely low. The patient does have noncompliance and has had recurrent admissions for the same. At this point, the patient's Coreg is being cut down and dosed significantly to 3.125 two times a day and Norvasc and small dose of 2.5 mg is being added and he will continue his Lasix and follow up with Dr. Thedore Mins of nephrology. The patient also had acute renal failure over CKD secondary to uncontrolled hypertension,  which is improving.  2. Elevated troponin, which was minimal. Echocardiogram showed nothing acute. Dr. Welton FlakesKhan with cardiology has seen the patient and this is thought to be due to demand ischemia from the uncontrolled hypertension.  3. Chronic obstructive pulmonary disease exacerbation has resolved.  4. Depression. The patient  does have a flat affect for which I have started him on trazodone.  He will need a psychiatry referral from his PCPs office.  5. Weakness. The patient was advised to be discharged to skilled nursing facility which the patient has refused. Also, he was offered home health physical therapy which has been refused, and he is being discharged home per his request.  6. Chronic diastolic CHF is stable. Continue Lasix.   Prior to discharge, the patient's lungs sound clear. S1, S2 heard.  Very mild edema.   TIME SPENT ON DAY OF DISCHARGE IN DISCHARGE ACTIVITIES:  Thirty-five minutes.   ____________________________ Molinda BailiffSrikar R. Katoria Yetman, MD srs:tr D: 11/06/2014 15:50:01 ET T: 11/06/2014 16:07:35 ET JOB#: [id number]  cc: Wardell HeathSrikar R. Leah Skora, MD, <Dictator> Orie FishermanSRIKAR R Lindalee Huizinga MD ELECTRONICALLY SIGNED 11/07/2014 8:30

## 2014-12-23 NOTE — H&P (Signed)
PATIENT NAME:  Gary Juarez, HANGARTNER MR#:  [id number] DATE OF BIRTH:  Dec 14, 1960  DATE OF ADMISSION:  11/25/2014  CHIEF COMPLAINT: Shortness of breath.   HISTORY OF PRESENT ILLNESS: This is a 54 year old male who was recently just discharged from our hospital here. He was admitted for CHF exacerbation and went home on March 27. He states that the day that he got home, he began having some periorbital swelling. He also complains that he had had some blurry vision prior to being sent home and that both the periorbital swelling and the blurry vision have gotten slowly progressively worse since that time. He also states that while at home he had some hallucinations. He saw and spoke to people that he knew were not there. He had some positive fever and chills, some positive nausea without vomiting or diarrhea, had increased shortness of breath again with a little bit of cough. He says that he returned to the hospital today because a neighbor saw him very short of breath as well as the periorbital swelling that he has and called EMS. The patient states that in terms of the periorbital swelling, he has never had this before. He is not on an ACE inhibitor, and he denies any hives or pruritus in association with this. He denies any swelling in his throat. On arrival to the ED, he was found to have an elevated BNP at 1521. Creatinine is sort of stable but elevated with chronic kidney disease. CT, maxillofacial, showed that his swelling around his eyes is not postseptal, is not intraorbital, there is no abscess, and that this is likely angioedema or cellulitis. The patient does not have clinical signs of cellulitis. The ophthalmologist was called by the ED physician and stated that, given the results of the CT scan, there was nothing acute for ophthalmology to do, so the patient was admitted for angioedema and also for HCAP, as chest x-ray in the ED showed airspace opacity in the right lung base, likely due to pneumonia.    PRIMARY CARE PHYSICIAN: Nonlocal.   PAST MEDICAL HISTORY: COPD, CHF, chronic back pain, hepatitis B, hypertension.   CURRENT MEDICATIONS: Trazodone 100 mg daily; Spiriva daily; ProAir as needed; prednisone taper, which the patient was just finishing up; Zofran 4 mg p.r.n. nausea; Lopressor 25 mg b.i.d.; Robaxin 500 mg 2 tablets q.6 p.r.n.; meclizine 25 mg t.i.d.; gabapentin 300 mg t.i.d.; carvedilol 3.125 mg b.i.d.; BiDil 37.5/20 mg t.i.d.; Norco 5/325 mg q.4 p.r.n. Of these medications, the only ones that the patient was on per chart review previously were the BiDil, the trazodone, and the carvedilol and the meclizine. These other medications were new during his last admission. He was also on the Norco.   PAST SURGICAL HISTORY: Wisdom tooth extraction.   ALLERGIES: MORPHINE GIVES HIM A HEADACHE.   FAMILY HISTORY: Hypertension, CAD, cancer.   SOCIAL HISTORY: Half a pack per day smoker for 20 years. No alcohol use. No illicit drug use.  REVIEW OF SYSTEMS:  CONSTITUTIONAL: Positive for subjective fever at home. No fatigue or weakness.  EYES: He has some blurred vision. No pain or redness. He does have some periorbital swelling bilaterally.  EAR, NOSE, AND THROAT: No ear pain, hearing loss, difficulty swallowing.  RESPIRATORY: Mild cough, nonproductive. He does have some increased shortness of breath, some dyspnea, no wheeze.  CARDIOVASCULAR: Denies chest pain, orthopnea, or palpitations.  GASTROINTESTINAL: Denies vomiting and diarrhea. Did have some nausea. No abdominal pain or constipation.  GENITOURINARY: No dysuria, hematuria, or frequency.  ENDOCRINE: No nocturia, thyroid problems, heat or cold intolerance. HEMATOLOGIC AND LYMPHATIC: No easy bruising, bleeding, swollen glands.  INTEGUMENTARY: No acne, rash, or lesion.  MUSCULOSKELETAL: No acute arthritis, joint swelling, or gout.  NEUROLOGICAL: No numbness, weakness, or headache.  PSYCHIATRIC: No anxiety, insomnia, or depression.    PHYSICAL EXAMINATION:  CONSTITUTIONAL: VITAL SIGNS: Blood pressure 160/84, pulse 60, temperature 97.8, respirations 22, with 94% oxygen saturations on room air.  GENERAL: This is a very obese gentleman lying in bed with oxygen in place with very mild respiratory distress.  HEENT: Pupils are equal, round, and reactive to light and accommodation. Extraocular movements are intact. There is no scleral icterus. His mucosal membranes are moist. He does have bilateral periorbital tissue edema without erythema or significant tenderness.  NECK: His thyroid is not enlarged. Neck is supple. No masses, nontender. No cervical adenopathy. No JVD.  RESPIRATORY: He has difficult-to-auscultate breath sounds bilaterally; however, no rales are heard. No rhonchi heard. He is in mild respiratory distress.  CARDIOVASCULAR: Regular rate and rhythm. No murmurs, rubs, or gallops auscultated on exam. No significant lower extremity edema.  ABDOMEN: Soft, nontender, nondistended, obese with good bowel sounds.  MUSCULOSKELETAL: Muscular strength 5/5 all 4 extremities. Full spontaneous range of motion throughout. No cyanosis or clubbing.  SKIN: No rash or lesions. Skin is warm, dry, and intact.  LYMPHATIC: No adenopathy. NEUROLOGIC: Cranial nerves intact. Sensation intact throughout. No dysarthria or aphasia.  PSYCHIATRIC: Alert and oriented x 3, cooperative, with good insight.   LABORATORY DATA: White count 6.3, hemoglobin 11.3, hematocrit 35.1, platelets 87,000. Sodium 139, potassium 4.0, chloride 105, bicarbonate 28, BUN 38, creatinine 2.52, glucose 152. BNP 1521, calcium 8.9, total protein 6.7, albumin 3.2, total bilirubin 0.6, alkaline phosphatase 72, AST 17, ALT 16. Troponin less than 0.03. INR 1.2.   RADIOLOGY: As discussed in HPI, x-ray shows right pneumonia and CT, maxillofacial, shows all periorbital edema without any intraorbital extension or postseptal extension or abscess.  ASSESSMENT AND PLAN: 1.   Healthcare-associated pneumonia. He was given vancomycin and Zosyn in the Emergency Department. We will continue these for now and tailor his antibiotics as soon as we can. We will get a sputum culture, a smear. The patient does not meet criteria for sepsis at this time. We will keep him on his oxygen, which was his home dose that he was already using. He is having appropriate saturations at this dose of oxygen.  2.  Angioedema. The patient got Solu-Medrol and Benadryl in the Emergency Department. We will continue giving him these medications. He is not on an ACE inhibitor. We are holding his medications that seem like they could be causatory for this, specifically including his Robaxin, meclizine, and BiDil, although none of those has a very high propensity to cause this problem. It could be idiopathic angioedema as well. We will check a serum C4. We will not check an ESR or CRP, just because with his pneumonia these are likely to be elevated anyway. We will monitor him for any signs of progression or respiratory distress.  3.  Congestive heart failure exacerbation. His BNP is up again. Given his chronic kidney disease, we will diurese him very gently with Lasix. We will give him an intravenous dose now and see how he responds to that, and then follow up in the morning.  4.  Chronic kidney disease. We will avoid nephrotoxic medications and, again, diurese him very gently so as to avoid any acute on chronic kidney injury.  5.  Blurred  vision. This was occurring prior to his discharge and prior to commencement of the angioedema. It could all be related, but he also had a new beta blocker started, Lopressor. It is unclear why he was on carvedilol and Lopressor. He was on carvedilol from before. The Lopressor was started during his last admission. This could potentially cause his blurred vision, as well as the Robaxin and the gabapentin. We will hold all these medications at this time and see how he improves.  6.   Hypertension. We will administer some of his home medications as listed in the above problems to control his blood pressure, and if needed, we can use some intravenous p.r.n. medications for this.  7.  Deep vein thrombosis prophylaxis. Subcutaneous heparin.   TIME SPENT ON THIS ADMISSION: 50 minutes.    ____________________________ Wilford Corner. Jannifer Franklin, MD dfw:ST D: 11/25/2014 22:51:47 ET T: 11/26/2014 00:15:26 ET JOB#: [id number]  cc: Wilford Corner. Jannifer Franklin, MD, <Dictator> Lorance Pickeral Fawn Kirk MD ELECTRONICALLY SIGNED 11/26/2014 2:21

## 2014-12-23 NOTE — Consult Note (Signed)
PATIENT NAME:  Gary MonarchMARLEY, Victorious B MR#:  [id number] DATE OF BIRTH:  1961/06/13  DATE OF CONSULTATION:  11/17/2014  CONSULTING PHYSICIAN:  Laurier NancyShaukat A. Meiah Zamudio, MD  INDICATION FOR CONSULTATION: Mildly elevated troponin and shortness of breath.   HISTORY OF PRESENT ILLNESS: This is a 54 year old, white male with a past medical history of renal failure, noncompliance, presented to the emergency room this time with abdominal pain, headache and elevated blood pressure. He was noncompliant with his medication. The last time he was admitted, last week, he had similar problems. He was not taking his medications. He denies any chest pain but has some shortness of breath and abdominal pain diffusely.   PAST MEDICAL HISTORY: Hypertension, chronic kidney disease, CHF, hepatitis B, COPD, degenerative disk disease.   SOCIAL HISTORY: He lives alone. He smokes about 1 to 2 packs per day for 33 years. He denies EtOH abuse or any drug use.   FAMILY HISTORY: Positive for coronary artery disease.   PHYSICAL EXAMINATION: GENERAL: He is alert and oriented, in no acute distress right now.  VITAL SIGNS: His blood pressure is 143/83, pulse 76, temperature 98.1 and saturation 86.  NECK: No JVD.  LUNGS: Good air entry. No rales or rhonchi.  HEART: Regular rate and rhythm. Normal S1, S2. No audible murmur.  ABDOMEN: Soft, slightly distended. Positive bowel sounds.  EXTREMITIES: 1+ pedal edema.  NEUROLOGIC: Appears to be intact.   DIAGNOSTICS: His EKG shows normal sinus rhythm, 81 beats per minute. Nonspecific ST-T changes.   LABORATORY DATA: BUN is 19. Creatinine is 2.28. Another creatinine was done and it was 2.94. Troponin is 0.03.   ASSESSMENT AND PLAN: The patient has chronically elevated troponin and platelet count. He presents repeatedly with malignant hypertension because of noncompliance. He has not followed up in the office after being discharged. He has refused to fill his medicines. Advise getting social  services consult. The patient does not need any further work-up. His echocardiogram last week showed normal ejection fraction and normal wall motion.  Thank you very much for the referral.    ____________________________ Laurier NancyShaukat A. Daneshia Tavano, MD sak:TT D: 11/17/2014 10:45:39 ET T: 11/17/2014 11:22:53 ET JOB#: [id number]  cc: Laurier NancyShaukat A. Ramel Tobon, MD, <Dictator> Laurier NancySHAUKAT A Remmie Bembenek MD ELECTRONICALLY SIGNED 12/04/2014 13:33

## 2014-12-23 NOTE — Consult Note (Signed)
PATIENT NAME:  Gary MonarchMARLEY, Gary B MR#:  [id number] DATE OF BIRTH:  28-Oct-1960  DATE OF CONSULTATION:  11/02/2014  REFERRING PHYSICIAN:   CONSULTING PHYSICIAN:  Laurier NancyShaukat A. Lakendra Helling, MD  INDICATION FOR CONSULTATION: Elevated troponin.  HISTORY OF PRESENT ILLNESS: This is a 54 year old white male with a past medical history of intermittent chest pain, was discharged last week after he signed against medical advice when he came also with chest pain and elevated troponin. He presented to the Emergency Room with leg pain and back pain and was having blood pressure systolic over 200, thus I was asked to evaluate the patient. The patient was placed on blood pressure medicines, but [facility] the medicine dosages so there is question whether he has been even taking them.   PAST MEDICAL HISTORY: Hypertension, hyperlipidemia, chronic kidney disease, CHF, COPD, degenerative disk disease.   SOCIAL HISTORY: He smokes for the last 33 years. He smokes 1 to 2 cigarettes per day. Occasional drug use.   FAMILY HISTORY: Positive for coronary artery disease.   HOME MEDICATIONS: Supposed to be aspirin 81, carvedilol 25 b.i.d., clonidine 0.3 t.i.d., Lasix 40 once a day, potassium chloride 20 mEq once a day.   ALLERGIES: MORPHINE.   PHYSICAL EXAMINATION: GENERAL: He is alert, oriented x3, in no acute distress.  VITALS: Blood pressure right now is 170/80, respirations 18, pulse 70. His blood pressure on admission was 204/111.  HEENT: No JVD.  LUNGS: Clear.  HEART: Regular rate and rhythm. Normal S1, S2. No audible murmur.  ABDOMEN: Soft, nontender. Positive bowel sounds.  EXTREMITIES: No pedal edema.  NEUROLOGIC: The patient appears to be intact.  DIAGNOSTIC DATA: EKG shows sinus rhythm. No acute changes.   His troponin is 0.06. BUN is 28, creatinine 2.49. His second troponin is 0.05.   ASSESSMENT AND PLAN: The patient has very mildly elevated troponin that appears to  be most likely due to renal insufficiency. He is currently not having any chest pain. I advise controlling the blood pressure. He is a noncompliant patient. There is question whether he has been taking any of his medicines. I advise adding hydralazine, changing hydralazine to 50 q.i.d., and we will follow with you.   Thank you very much for the referral.   ____________________________ Laurier NancyShaukat A. Marios Gaiser, MD sak:sb D: 11/02/2014 09:08:37 ET T: 11/02/2014 09:21:34 ET JOB#: [id number]  cc: Laurier NancyShaukat A. Fynn Vanblarcom, MD, <Dictator> Laurier NancySHAUKAT A Sundae Maners MD ELECTRONICALLY SIGNED 12/04/2014 13:32

## 2014-12-23 NOTE — Discharge Summary (Signed)
PATIENT NAME:  Gary Juarez, Gary Juarez MR#:  [id number] DATE OF BIRTH:  [birth year]  DATE OF ADMISSION:  11/25/2014 DATE OF DISCHARGE:  11/27/2014  ADMISSION DIAGNOSES: 1. Healthcare acquired pneumonia.  2. Angioedema.  3. Congestive heart failure, diastolic in nature.   DISCHARGE DIAGNOSES: 1. Healthcare acquired pneumonia  2. Chronic diastolic heart failure.  3. Essential hypertension. 4. Angioedema, periorbital.  5. Chronic lower back pain.   CONSULTATIONS: None.   PHYSICAL EXAMINATION AT DISCHARGE: VITAL SIGNS: Temperature 97.6. Pulse is 62, respirations 20, blood pressure 135/76, 95% on 2 liters.  GENERAL: The patient is alert and oriented in no acute distress.  CARDIOVASCULAR: Regular rate and rhythm.  Hard to appreciate PMI due to body habitus.  LUNGS: Clear to auscultation. There are no crackles, rales, rhonchi, or wheezing.  ABDOMEN: Obese. Bowel sounds are positive. Hard to appreciate organomegaly due to body habitus.  EXTREMITIES: No clubbing, cyanosis, or edema.   LABORATORIES AT DISCHARGE: Sodium 132, potassium 4.1, chloride 100, bicarbonate 26, BUN 41, creatinine 2.5, glucose is 167.   HOSPITAL COURSE: This is a 54 year old male who presented with shortness of breath, found to have a healthcare acquired pneumonia. For further details, please see the H and P.  1. Healthcare acquired pneumonia. The patient was recently admitted for CHF exacerbation and now presents with a pneumonia.   Chest x-ray showing possible hilar mass, will need repeat chest x-ray or CT in 4 weeks.  The patient was continued on broad-spectrum antibiotics including Zosyn and vancomycin, which were tailored to p.o. medication at discharge.  2. Chronic diastolic heart failure.  Did not appear to be in heart failure at this time. He will be referred to  CHF outpatient clinic.  3. Essential hypertension.  The patient was continued on hydralazine, Imdur,  and Coreg.  4. Angioedema, periorbital in nature.  Upon  admission, the patient was placed on Decadron and Benadryl and actually did quite well with this angioedema.  The etiology of angioedema is unknown at this time. 5. Chronic lower back pain. He was continued on his pain regimen.   DISCHARGE MEDICATIONS: 1. ProAir 2 puffs 4 times a day p.r.n.  2. Trazodone 100 mg daily.  3. Tylenol hydrocodone 5/325 q. 4 hours p.r.n.  4. Spiriva 18 mcg daily.  5. Coreg 3.125 Juarez.i.d.  6. Meclizine 25 mg t.i.d.  7. Methocarbamol 500 mg, 2 tablets 4 times a day.  8. Gabapentin 300 mg t.i.d.  9. Fluticasone as needed.  10. Prednisone 50 mg tapered by 10 mg every day.  11. Lidocaine patch q. 12 hours on and q. 12 hours off.  12. Hydroxyzine 50 mg q. 8 hours.  13. Imdur 20 mg t.i.d.   DISCHARGE DIET: Low sodium.   DISCHARGE ACTIVITY: As tolerated.   DISCHARGE FOLLOW-UP:  The patient will need to follow-up Dr. Thedore MinsSingh in 1 week, the CHF clinic in 1 week TIME SPENT:  Approximately 35 minutes.   The patient was stable for discharge.     ____________________________ Haizlee Henton P. Juliene PinaMody, MD spm:tr D: 11/27/2014 11:55:36 ET T: 11/27/2014 12:39:42 ET JOB#: [id number]  cc: Maven Rosander P. Juliene PinaMody, MD, <Dictator> Mosetta PigeonHarmeet Singh, MD Barton Fannyina Juarez. Bing NeighborsHackney, NP Patricia PesaSITAL P Jamil Castillo MD ELECTRONICALLY SIGNED 11/28/2014 11:38

## 2015-01-03 ENCOUNTER — Ambulatory Visit: Payer: Self-pay | Attending: Family | Admitting: Family

## 2015-01-03 ENCOUNTER — Encounter: Payer: Self-pay | Admitting: Family

## 2015-01-03 ENCOUNTER — Other Ambulatory Visit: Payer: Self-pay

## 2015-01-03 VITALS — BP 148/67 | HR 63 | Resp 20 | Ht 71.0 in | Wt 349.0 lb

## 2015-01-03 DIAGNOSIS — I5030 Unspecified diastolic (congestive) heart failure: Secondary | ICD-10-CM | POA: Insufficient documentation

## 2015-01-03 DIAGNOSIS — M545 Low back pain: Secondary | ICD-10-CM | POA: Insufficient documentation

## 2015-01-03 DIAGNOSIS — I10 Essential (primary) hypertension: Secondary | ICD-10-CM | POA: Insufficient documentation

## 2015-01-03 DIAGNOSIS — I209 Angina pectoris, unspecified: Secondary | ICD-10-CM | POA: Insufficient documentation

## 2015-01-03 DIAGNOSIS — I5032 Chronic diastolic (congestive) heart failure: Secondary | ICD-10-CM

## 2015-01-03 DIAGNOSIS — G8929 Other chronic pain: Secondary | ICD-10-CM | POA: Insufficient documentation

## 2015-01-03 NOTE — Progress Notes (Signed)
Subjective:    Patient ID: Gary Juarez, male    DOB: 08/25/1960, 54 y.o.   MRN: [id number]  Shortness of Breath This is a chronic problem. The current episode started more than 1 year ago. The problem occurs intermittently. The problem has been gradually improving. Associated symptoms include chest pain, headaches and leg swelling. Pertinent negatives include no abdominal pain, neck pain, sore throat or wheezing. The symptoms are aggravated by emotional upset and lying flat. He has tried ipratropium inhalers and OTC inhalers for the symptoms. The treatment provided moderate relief. His past medical history is significant for COPD and a heart failure.  Back Pain This is a chronic problem. The current episode started more than 1 year ago. The problem occurs constantly. The problem has been gradually worsening since onset. The pain is present in the lumbar spine. The quality of the pain is described as stabbing. The pain does not radiate. The pain is at a severity of 8/10. The pain is severe. The pain is the same all the time. The symptoms are aggravated by sitting and coughing. Associated symptoms include chest pain and headaches. Pertinent negatives include no abdominal pain or tingling. Risk factors include sedentary lifestyle and obesity. He has tried analgesics and walking for the symptoms. The treatment provided mild relief.  Chest Pain  This is a new problem. The current episode started today ("started a few minutes ago"). The onset quality is sudden. The problem occurs constantly. The problem has been unchanged. The pain is present in the lateral region. The pain is at a severity of 8/10. The pain is severe. The quality of the pain is described as sharp. The pain does not radiate. Associated symptoms include back pain, dizziness, headaches, lower extremity edema, nausea and shortness of breath. Pertinent negatives include no abdominal pain, cough, diaphoresis or palpitations. The pain is  aggravated by nothing. He has tried nothing for the symptoms. Risk factors include male gender, obesity and sedentary lifestyle.  His past medical history is significant for COPD, CHF and hypertension.      Review of Systems  Constitutional: Positive for fatigue and unexpected weight change (lost 40 pounds). Negative for diaphoresis.  HENT: Negative for postnasal drip and sore throat.   Eyes: Negative.   Respiratory: Positive for shortness of breath. Negative for cough, chest tightness and wheezing.   Cardiovascular: Positive for chest pain and leg swelling. Negative for palpitations.  Gastrointestinal: Positive for nausea and abdominal distention. Negative for abdominal pain and diarrhea.  Endocrine: Negative.   Genitourinary: Positive for scrotal swelling. Negative for testicular pain.  Musculoskeletal: Positive for back pain. Negative for neck pain.  Skin: Negative.   Neurological: Positive for dizziness and headaches. Negative for tingling.  Hematological: Negative for adenopathy. Bruises/bleeds easily.  Psychiatric/Behavioral: Positive for sleep disturbance. Negative for agitation.       Objective:   Physical Exam  Constitutional: He is oriented to person, place, and time. He appears well-developed and well-nourished. No distress.  HENT:  Head: Normocephalic and atraumatic.  Eyes: Conjunctivae are normal. Pupils are equal, round, and reactive to light.  Neck: Normal range of motion. Neck supple. No JVD present.  Cardiovascular: Normal rate and regular rhythm.   Pulmonary/Chest: Effort normal and breath sounds normal. No stridor.  Abdominal: He exhibits distension. There is no tenderness.  Genitourinary: Right testis shows swelling. Left testis shows swelling.  Musculoskeletal: He exhibits edema (2+ bilateral lower legs) and tenderness.  Neurological: He is alert and oriented to person, place,  and time.  Skin: Skin is warm and dry. He is not diaphoretic.  Psychiatric: His  speech is normal. Thought content normal. His mood appears anxious. He is agitated.  Nursing note and vitals reviewed.         Assessment & Plan:  1: Chronic heart failure with preserved ejection fraction- Patient presents with stable shortness of breath and fatigue. He does endorse increasing edema in lower legs and scrotal area. [facility] renal and liver failure so will continue diuretic at this time. Encouraged him to elevate legs and scrotum at home. He [facility] and working through the process [facility]. He has been weighing himself at home and has lost close to 40 pounds in the last couple of months due to fluid. Reminded him to continue weighing and to call for an overnight weight gain of >2 pounds or a weekly weight gain of >5 pounds. Continues to follow a low sodium diet. 2: Angina- Patient says this sharp chest pain started a few minutes prior to this appointment. It is located on the left anterior chest wall. Intermittent in nature. No radiation and no associated symptoms. No history of CAD. EKG machine in the office did not work so an order was placed for patient to have it done in preadmit testing. Patient said he was leaving and would not go get the EKG done. Encouraged him to get it done prior to leaving and if his chest pain worsened, to present to the ER.  3: HTN- Patient's blood pressure looks good today. Continue medications at this time.  4: Chronic back pain- Patient says his back pain continues to be severe in nature. His PCP was called and his PCP [facility] as he is already taking oral narcotics and they didn't feel like there was anything they could do for him in this regard.    Return here in 3 months or sooner for any questions/problems.

## 2015-04-05 ENCOUNTER — Encounter: Payer: Self-pay | Admitting: Family

## 2015-04-05 ENCOUNTER — Ambulatory Visit: Payer: Self-pay | Attending: Family | Admitting: Family

## 2015-04-05 VITALS — BP 160/90 | HR 74 | Resp 20 | Ht 71.0 in | Wt 335.0 lb

## 2015-04-05 DIAGNOSIS — M545 Low back pain, unspecified: Secondary | ICD-10-CM

## 2015-04-05 DIAGNOSIS — I5032 Chronic diastolic (congestive) heart failure: Secondary | ICD-10-CM

## 2015-04-05 DIAGNOSIS — Z7982 Long term (current) use of aspirin: Secondary | ICD-10-CM | POA: Insufficient documentation

## 2015-04-05 DIAGNOSIS — J449 Chronic obstructive pulmonary disease, unspecified: Secondary | ICD-10-CM | POA: Insufficient documentation

## 2015-04-05 DIAGNOSIS — N189 Chronic kidney disease, unspecified: Secondary | ICD-10-CM | POA: Insufficient documentation

## 2015-04-05 DIAGNOSIS — F329 Major depressive disorder, single episode, unspecified: Secondary | ICD-10-CM | POA: Insufficient documentation

## 2015-04-05 DIAGNOSIS — Z87891 Personal history of nicotine dependence: Secondary | ICD-10-CM | POA: Insufficient documentation

## 2015-04-05 DIAGNOSIS — I129 Hypertensive chronic kidney disease with stage 1 through stage 4 chronic kidney disease, or unspecified chronic kidney disease: Secondary | ICD-10-CM | POA: Insufficient documentation

## 2015-04-05 DIAGNOSIS — I10 Essential (primary) hypertension: Secondary | ICD-10-CM

## 2015-04-05 DIAGNOSIS — Z79899 Other long term (current) drug therapy: Secondary | ICD-10-CM | POA: Insufficient documentation

## 2015-04-05 DIAGNOSIS — G8929 Other chronic pain: Secondary | ICD-10-CM | POA: Insufficient documentation

## 2015-04-05 MED ORDER — HYDROCODONE-ACETAMINOPHEN 5-325 MG PO TABS: 1.0000 | ORAL_TABLET | Freq: Four times a day (QID) | ORAL | Status: DC | PRN

## 2015-04-05 NOTE — Progress Notes (Signed)
Subjective:    Patient ID: Gary Juarez, male    DOB: 12/31/1960, 54 y.o.   MRN: [id number]  Congestive Heart Failure Presents for follow-up visit. Associated symptoms include edema and shortness of breath. Pertinent negatives include no abdominal pain, chest pain, chest pressure, fatigue, orthopnea or palpitations. The symptoms have been improving. Past treatments include beta blockers, aldosterone receptor blockers and salt and fluid restriction. The treatment provided significant relief. Compliance with prior treatments has been good. His past medical history is significant for HTN. Compliance with total regimen is 76-100%.  Other This is a chronic (edema) problem. The current episode started more than 1 year ago. The problem occurs daily. The problem has been rapidly improving. Associated symptoms include coughing and headaches. Pertinent negatives include no abdominal pain, chest pain, congestion, fatigue, fever, neck pain, numbness, sore throat or weakness. The symptoms are aggravated by standing. He has tried position changes for the symptoms. The treatment provided significant relief.  Back Pain This is a chronic problem. The current episode started more than 1 year ago. The problem occurs daily. The problem has been waxing and waning since onset. The pain is present in the lumbar spine. The quality of the pain is described as stabbing. The pain does not radiate. The pain is at a severity of 4/10. The pain is mild. The pain is the same all the time. The symptoms are aggravated by sitting, twisting and bending. Associated symptoms include headaches. Pertinent negatives include no abdominal pain, chest pain, fever, numbness, paresthesias, tingling or weakness. Risk factors include lack of exercise, obesity and sedentary lifestyle. He has tried analgesics, bed rest and muscle relaxant for the symptoms. The treatment provided moderate relief.    Past Medical History  Diagnosis Date  .  Hypertension   . COPD (chronic obstructive pulmonary disease)   . Chronic kidney disease   . Chronic back pain greater than 3 months duration   . Phlebitis 1992    left calf  . Anxiety and depression     Past Surgical History  Procedure Laterality Date  . Wisdom tooth extraction      Family History  Problem Relation Age of Onset  . Stroke Mother   . Cancer Mother     Breast    Social History  Substance Use Topics  . Smoking status: Former Smoker -- 0.25 packs/day for 20 years    Types: Cigarettes    Quit date: 12/13/2014  . Smokeless tobacco: Never Used  . Alcohol Use: No    Allergies  Allergen Reactions  . Morphine And Related Other (See Comments)    Headaches    Prior to Admission medications   Medication Sig Start Date End Date Taking? Authorizing Provider  acetaminophen (TYLENOL) 325 MG tablet Take 650 mg by mouth every 6 (six) hours as needed.   Yes Historical Provider, MD  albuterol (PROVENTIL HFA;VENTOLIN HFA) 108 (90 BASE) MCG/ACT inhaler Inhale 2 puffs into the lungs every 6 (six) hours as needed for wheezing or shortness of breath.   Yes Historical Provider, MD  amLODipine (NORVASC) 5 MG tablet Take 5 mg by mouth daily.   Yes Historical Provider, MD  aspirin EC 81 MG tablet Take 81 mg by mouth daily.   Yes Historical Provider, MD  atorvastatin (LIPITOR) 20 MG tablet Take 20 mg by mouth daily.   Yes Historical Provider, MD  carvedilol (COREG) 3.125 MG tablet Take 12.5 mg by mouth 2 (two) times daily with a meal.  Yes Historical Provider, MD  cyclobenzaprine (FLEXERIL) 10 MG tablet Take 10 mg by mouth 3 (three) times daily as needed for muscle spasms.   Yes Historical Provider, MD  fluticasone (VERAMYST) 27.5 MCG/SPRAY nasal spray Place 2 sprays into the nose as needed for rhinitis.   Yes Historical Provider, MD  furosemide (LASIX) 40 MG tablet Take 40 mg by mouth 2 (two) times daily.   Yes Historical Provider, MD  gabapentin (NEURONTIN) 300 MG capsule Take  300 mg by mouth 3 (three) times daily.   Yes Historical Provider, MD  pantoprazole (PROTONIX) 40 MG tablet Take 40 mg by mouth daily.   Yes Historical Provider, MD  spironolactone (ALDACTONE) 25 MG tablet Take 25 mg by mouth daily.   Yes Historical Provider, MD  tiotropium (SPIRIVA) 18 MCG inhalation capsule Place 18 mcg into inhaler and inhale daily.   Yes Historical Provider, MD  HYDROcodone-acetaminophen (NORCO) 5-325 MG per tablet Take 1 tablet by mouth every 6 (six) hours as needed for moderate pain. 04/05/15   Delma Freeze, FNP     Review of Systems  Constitutional: Negative for fever, appetite change and fatigue.  HENT: Negative for congestion, postnasal drip and sore throat.   Eyes: Negative.   Respiratory: Positive for cough, shortness of breath and wheezing. Negative for chest tightness.   Cardiovascular: Positive for leg swelling. Negative for chest pain and palpitations.  Gastrointestinal: Negative for abdominal pain, diarrhea and abdominal distention.  Endocrine: Negative.   Genitourinary: Negative.   Musculoskeletal: Positive for back pain. Negative for neck pain.  Skin: Negative.   Allergic/Immunologic: Negative.   Neurological: Positive for headaches. Negative for dizziness, tingling, weakness, light-headedness, numbness and paresthesias.  Hematological: Negative for adenopathy. Does not bruise/bleed easily.  Psychiatric/Behavioral: Negative for sleep disturbance (sleeping on 2 pillows) and dysphoric mood. The patient is not nervous/anxious.        Objective:   Physical Exam  Constitutional: He is oriented to person, place, and time. He appears well-developed and well-nourished.  HENT:  Head: Normocephalic and atraumatic.  Eyes: Conjunctivae are normal. Pupils are equal, round, and reactive to light.  Neck: Normal range of motion. Neck supple.  Cardiovascular: Normal rate and regular rhythm.   Pulmonary/Chest: Effort normal. He has no wheezes. He has no rales.   Abdominal: Soft. He exhibits no distension. There is no tenderness.  Musculoskeletal: He exhibits edema (trace amount in bilateral lower legs). He exhibits no tenderness.  Neurological: He is alert and oriented to person, place, and time.  Skin: Skin is warm and dry.  Psychiatric: He has a normal mood and affect. His behavior is normal. Thought content normal.  Nursing note and vitals reviewed.   BP 160/90 mmHg  Pulse 74  Resp 20  Ht 5\' 11"  (1.803 m)  Wt 335 lb (151.955 kg)  BMI 46.74 kg/m2  SpO2 98%       Assessment & Plan:  1: Chronic heart failure with preserved ejection fraction- Patient presents with some shortness of breath and slight edema. He denies feeling fatigued. He continues to weigh himself and reports a gradual weight loss. By our scale, he has lost 14 pounds since he was here last time. Reminded to call for an overnight weight gain of >2 pounds or a weekly weight gain of >5 pounds. He continues to follow a low sodium diet.  2: HTN- Blood pressure mildly elevated today even on recheck. Will continue to monitor. Sees his PCP in a couple of months. 3: Low back pain-  This is chronic in nature and although he says that it's improving (probably due to weight loss) he occasionally has the sharp pain. Since he doesn't see his PCP for a couple of months, he asked for a small number of norco just in case he needs it. Prescription printed and given for hydrocodone/acet 5/325mg  12 tablets to take every six hours if needed without refills.   Return in 3 months or sooner for any questions/problems before then.

## 2015-04-05 NOTE — Patient Instructions (Signed)
Continue weighing daily and call for an overnight weight gain of > 2 pounds or a weekly weight gain of >5 pounds. 

## 2015-05-01 ENCOUNTER — Encounter: Payer: Self-pay | Admitting: Emergency Medicine

## 2015-05-01 ENCOUNTER — Emergency Department: Payer: Self-pay

## 2015-05-01 ENCOUNTER — Inpatient Hospital Stay
Admission: EM | Admit: 2015-05-01 | Discharge: 2015-05-05 | DRG: 291 | Disposition: A | Discharge status: Home / Self Care | Payer: Self-pay | Attending: Internal Medicine | Admitting: Internal Medicine

## 2015-05-01 DIAGNOSIS — B181 Chronic viral hepatitis B without delta-agent: Secondary | ICD-10-CM | POA: Diagnosis present

## 2015-05-01 DIAGNOSIS — J9692 Respiratory failure, unspecified with hypercapnia: Secondary | ICD-10-CM

## 2015-05-01 DIAGNOSIS — R401 Stupor: Secondary | ICD-10-CM

## 2015-05-01 DIAGNOSIS — J440 Chronic obstructive pulmonary disease with acute lower respiratory infection: Secondary | ICD-10-CM | POA: Diagnosis present

## 2015-05-01 DIAGNOSIS — N184 Chronic kidney disease, stage 4 (severe): Secondary | ICD-10-CM | POA: Diagnosis present

## 2015-05-01 DIAGNOSIS — J9622 Acute and chronic respiratory failure with hypercapnia: Secondary | ICD-10-CM | POA: Diagnosis present

## 2015-05-01 DIAGNOSIS — K7460 Unspecified cirrhosis of liver: Secondary | ICD-10-CM | POA: Diagnosis present

## 2015-05-01 DIAGNOSIS — Z79899 Other long term (current) drug therapy: Secondary | ICD-10-CM

## 2015-05-01 DIAGNOSIS — G8929 Other chronic pain: Secondary | ICD-10-CM | POA: Diagnosis present

## 2015-05-01 DIAGNOSIS — Z885 Allergy status to narcotic agent status: Secondary | ICD-10-CM

## 2015-05-01 DIAGNOSIS — Z7982 Long term (current) use of aspirin: Secondary | ICD-10-CM

## 2015-05-01 DIAGNOSIS — M545 Low back pain, unspecified: Secondary | ICD-10-CM

## 2015-05-01 DIAGNOSIS — E662 Morbid (severe) obesity with alveolar hypoventilation: Secondary | ICD-10-CM | POA: Diagnosis present

## 2015-05-01 DIAGNOSIS — N189 Chronic kidney disease, unspecified: Secondary | ICD-10-CM

## 2015-05-01 DIAGNOSIS — J441 Chronic obstructive pulmonary disease with (acute) exacerbation: Secondary | ICD-10-CM | POA: Diagnosis present

## 2015-05-01 DIAGNOSIS — I2510 Atherosclerotic heart disease of native coronary artery without angina pectoris: Secondary | ICD-10-CM | POA: Diagnosis present

## 2015-05-01 DIAGNOSIS — E785 Hyperlipidemia, unspecified: Secondary | ICD-10-CM | POA: Diagnosis present

## 2015-05-01 DIAGNOSIS — Z823 Family history of stroke: Secondary | ICD-10-CM

## 2015-05-01 DIAGNOSIS — Z87891 Personal history of nicotine dependence: Secondary | ICD-10-CM

## 2015-05-01 DIAGNOSIS — J209 Acute bronchitis, unspecified: Secondary | ICD-10-CM | POA: Diagnosis present

## 2015-05-01 DIAGNOSIS — G4733 Obstructive sleep apnea (adult) (pediatric): Secondary | ICD-10-CM | POA: Diagnosis present

## 2015-05-01 DIAGNOSIS — I5030 Unspecified diastolic (congestive) heart failure: Secondary | ICD-10-CM

## 2015-05-01 DIAGNOSIS — I5033 Acute on chronic diastolic (congestive) heart failure: Principal | ICD-10-CM | POA: Diagnosis present

## 2015-05-01 DIAGNOSIS — Z803 Family history of malignant neoplasm of breast: Secondary | ICD-10-CM

## 2015-05-01 DIAGNOSIS — D638 Anemia in other chronic diseases classified elsewhere: Secondary | ICD-10-CM | POA: Diagnosis present

## 2015-05-01 DIAGNOSIS — J9602 Acute respiratory failure with hypercapnia: Secondary | ICD-10-CM | POA: Diagnosis present

## 2015-05-01 DIAGNOSIS — E872 Acidosis: Secondary | ICD-10-CM | POA: Diagnosis present

## 2015-05-01 DIAGNOSIS — N179 Acute kidney failure, unspecified: Secondary | ICD-10-CM

## 2015-05-01 DIAGNOSIS — I129 Hypertensive chronic kidney disease with stage 1 through stage 4 chronic kidney disease, or unspecified chronic kidney disease: Secondary | ICD-10-CM | POA: Diagnosis present

## 2015-05-01 DIAGNOSIS — J9691 Respiratory failure, unspecified with hypoxia: Secondary | ICD-10-CM

## 2015-05-01 DIAGNOSIS — Z4659 Encounter for fitting and adjustment of other gastrointestinal appliance and device: Secondary | ICD-10-CM

## 2015-05-01 DIAGNOSIS — D6959 Other secondary thrombocytopenia: Secondary | ICD-10-CM | POA: Diagnosis present

## 2015-05-01 DIAGNOSIS — Z9981 Dependence on supplemental oxygen: Secondary | ICD-10-CM

## 2015-05-01 DIAGNOSIS — G9341 Metabolic encephalopathy: Secondary | ICD-10-CM | POA: Diagnosis present

## 2015-05-01 DIAGNOSIS — Z6841 Body Mass Index (BMI) 40.0 and over, adult: Secondary | ICD-10-CM

## 2015-05-01 DIAGNOSIS — Z8672 Personal history of thrombophlebitis: Secondary | ICD-10-CM

## 2015-05-01 DIAGNOSIS — J9621 Acute and chronic respiratory failure with hypoxia: Secondary | ICD-10-CM | POA: Diagnosis present

## 2015-05-01 DIAGNOSIS — Z9889 Other specified postprocedural states: Secondary | ICD-10-CM

## 2015-05-01 DIAGNOSIS — R809 Proteinuria, unspecified: Secondary | ICD-10-CM | POA: Diagnosis present

## 2015-05-01 LAB — CBC
HEMATOCRIT: 35 % — AB (ref 40.0–52.0)
HEMOGLOBIN: 11.2 g/dL — AB (ref 13.0–18.0)
MCH: 27.4 pg (ref 26.0–34.0)
MCHC: 32.1 g/dL (ref 32.0–36.0)
MCV: 85.3 fL (ref 80.0–100.0)
Platelets: 99 10*3/uL — ABNORMAL LOW (ref 150–440)
RBC: 4.1 MIL/uL — ABNORMAL LOW (ref 4.40–5.90)
RDW: 20.6 % — AB (ref 11.5–14.5)
WBC: 11.3 10*3/uL — ABNORMAL HIGH (ref 3.8–10.6)

## 2015-05-01 LAB — COMPREHENSIVE METABOLIC PANEL
ALT: 11 U/L — AB (ref 17–63)
AST: 19 U/L (ref 15–41)
Albumin: 3.4 g/dL — ABNORMAL LOW (ref 3.5–5.0)
Alkaline Phosphatase: 105 U/L (ref 38–126)
Anion gap: 7 (ref 5–15)
BUN: 42 mg/dL — AB (ref 6–20)
CHLORIDE: 110 mmol/L (ref 101–111)
CO2: 22 mmol/L (ref 22–32)
CREATININE: 3.53 mg/dL — AB (ref 0.61–1.24)
Calcium: 8.8 mg/dL — ABNORMAL LOW (ref 8.9–10.3)
GFR calc Af Amer: 21 mL/min — ABNORMAL LOW (ref >60)
GFR calc non Af Amer: 18 mL/min — ABNORMAL LOW (ref >60)
Glucose, Bld: 85 mg/dL (ref 65–99)
Potassium: 4.4 mmol/L (ref 3.5–5.1)
SODIUM: 139 mmol/L (ref 135–145)
Total Bilirubin: 1.4 mg/dL — ABNORMAL HIGH (ref 0.3–1.2)
Total Protein: 6.9 g/dL (ref 6.5–8.1)

## 2015-05-01 LAB — CBC WITH DIFFERENTIAL/PLATELET
BASOS ABS: 0 10*3/uL (ref 0–0.1)
Basophils Relative: 0 %
Eosinophils Absolute: 0.2 10*3/uL (ref 0–0.7)
Eosinophils Relative: 1 %
HEMATOCRIT: 34.9 % — AB (ref 40.0–52.0)
Hemoglobin: 11.1 g/dL — ABNORMAL LOW (ref 13.0–18.0)
LYMPHS PCT: 9 %
Lymphs Abs: 1.1 10*3/uL (ref 1.0–3.6)
MCH: 26.9 pg (ref 26.0–34.0)
MCHC: 31.8 g/dL — ABNORMAL LOW (ref 32.0–36.0)
MCV: 84.6 fL (ref 80.0–100.0)
MONO ABS: 2 10*3/uL — AB (ref 0.2–1.0)
Monocytes Relative: 16 %
NEUTROS ABS: 9.1 10*3/uL — AB (ref 1.4–6.5)
Neutrophils Relative %: 74 %
Platelets: 123 10*3/uL — ABNORMAL LOW (ref 150–440)
RBC: 4.12 MIL/uL — ABNORMAL LOW (ref 4.40–5.90)
RDW: 20.7 % — AB (ref 11.5–14.5)
WBC: 12.3 10*3/uL — ABNORMAL HIGH (ref 3.8–10.6)

## 2015-05-01 LAB — CREATININE, SERUM
Creatinine, Ser: 3.51 mg/dL — ABNORMAL HIGH (ref 0.61–1.24)
GFR calc non Af Amer: 18 mL/min — ABNORMAL LOW (ref >60)
GFR, EST AFRICAN AMERICAN: 21 mL/min — AB (ref >60)

## 2015-05-01 LAB — ETHANOL: Alcohol, Ethyl (B): <5 mg/dL (ref <5)

## 2015-05-01 LAB — TROPONIN I: TROPONIN I: 0.06 ng/mL — AB (ref <0.031)

## 2015-05-01 LAB — URINALYSIS COMPLETE WITH MICROSCOPIC (ARMC ONLY)
Bacteria, UA: NONE SEEN
Bilirubin Urine: NEGATIVE
GLUCOSE, UA: NEGATIVE mg/dL
KETONES UR: NEGATIVE mg/dL
Leukocytes, UA: NEGATIVE
NITRITE: NEGATIVE
Protein, ur: >500 mg/dL — AB
SPECIFIC GRAVITY, URINE: 1.016 (ref 1.005–1.030)
pH: 5 (ref 5.0–8.0)

## 2015-05-01 LAB — URINE DRUG SCREEN, QUALITATIVE (ARMC ONLY)
AMPHETAMINES, UR SCREEN: NOT DETECTED
Barbiturates, Ur Screen: NOT DETECTED
Benzodiazepine, Ur Scrn: NOT DETECTED
COCAINE METABOLITE, UR ~~LOC~~: NOT DETECTED
Cannabinoid 50 Ng, Ur ~~LOC~~: POSITIVE — AB
MDMA (ECSTASY) UR SCREEN: NOT DETECTED
Methadone Scn, Ur: NOT DETECTED
OPIATE, UR SCREEN: NOT DETECTED
PHENCYCLIDINE (PCP) UR S: NOT DETECTED
Tricyclic, Ur Screen: NOT DETECTED

## 2015-05-01 LAB — BRAIN NATRIURETIC PEPTIDE: B NATRIURETIC PEPTIDE 5: 1607 pg/mL — AB (ref 0.0–100.0)

## 2015-05-01 LAB — ACETAMINOPHEN LEVEL: Acetaminophen (Tylenol), Serum: <10 ug/mL — ABNORMAL LOW (ref 10–30)

## 2015-05-01 LAB — LACTIC ACID, PLASMA: Lactic Acid, Venous: 1 mmol/L (ref 0.5–2.0)

## 2015-05-01 LAB — SALICYLATE LEVEL: Salicylate Lvl: <4 mg/dL (ref 2.8–30.0)

## 2015-05-01 MED ORDER — ALBUTEROL SULFATE (2.5 MG/3ML) 0.083% IN NEBU
2.5000 mg | INHALATION_SOLUTION | RESPIRATORY_TRACT | Status: DC
  Administered 2015-05-01 – 2015-05-05 (×21): 2.5 mg via RESPIRATORY_TRACT
  Filled 2015-05-01 (×24): qty 3

## 2015-05-01 MED ORDER — BUDESONIDE 0.25 MG/2ML IN SUSP
0.2500 mg | Freq: Two times a day (BID) | RESPIRATORY_TRACT | Status: DC
  Administered 2015-05-01 – 2015-05-02 (×2): 0.25 mg via RESPIRATORY_TRACT
  Filled 2015-05-01 (×2): qty 2

## 2015-05-01 MED ORDER — LORAZEPAM 2 MG/ML IJ SOLN
0.5000 mg | INTRAMUSCULAR | Status: DC | PRN
  Administered 2015-05-01: 0.5 mg via INTRAVENOUS
  Filled 2015-05-01: qty 1

## 2015-05-01 MED ORDER — ALBUTEROL SULFATE (2.5 MG/3ML) 0.083% IN NEBU: 2.5000 mg | INHALATION_SOLUTION | Freq: Four times a day (QID) | RESPIRATORY_TRACT | Status: DC

## 2015-05-01 MED ORDER — HYDRALAZINE HCL 20 MG/ML IJ SOLN: 10.0000 mg | INTRAMUSCULAR | Status: DC | PRN

## 2015-05-01 MED ORDER — PANTOPRAZOLE SODIUM 40 MG PO TBEC: 40.0000 mg | DELAYED_RELEASE_TABLET | Freq: Every day | ORAL | Status: DC

## 2015-05-01 MED ORDER — CARVEDILOL 12.5 MG PO TABS
12.5000 mg | ORAL_TABLET | Freq: Two times a day (BID) | ORAL | Status: DC
  Administered 2015-05-03 – 2015-05-05 (×5): 12.5 mg via ORAL
  Filled 2015-05-01 (×5): qty 1

## 2015-05-01 MED ORDER — LORAZEPAM 2 MG/ML IJ SOLN
INTRAMUSCULAR | Status: AC
  Administered 2015-05-01: 0.5 mg via INTRAVENOUS
  Filled 2015-05-01: qty 1

## 2015-05-01 MED ORDER — LORAZEPAM 2 MG/ML IJ SOLN
0.5000 mg | Freq: Once | INTRAMUSCULAR | Status: AC
  Administered 2015-05-01: 0.5 mg via INTRAVENOUS

## 2015-05-01 MED ORDER — ASPIRIN 81 MG PO CHEW: 81.0000 mg | CHEWABLE_TABLET | Freq: Every day | ORAL | Status: DC

## 2015-05-01 MED ORDER — FUROSEMIDE 10 MG/ML IJ SOLN: 40.0000 mg | Freq: Two times a day (BID) | INTRAMUSCULAR | Status: DC

## 2015-05-01 MED ORDER — FUROSEMIDE 10 MG/ML IJ SOLN
40.0000 mg | Freq: Three times a day (TID) | INTRAMUSCULAR | Status: DC
  Administered 2015-05-01 – 2015-05-02 (×2): 40 mg via INTRAVENOUS
  Filled 2015-05-01 (×2): qty 4

## 2015-05-01 MED ORDER — ATORVASTATIN CALCIUM 20 MG PO TABS: 20.0000 mg | ORAL_TABLET | Freq: Every day | ORAL | Status: DC

## 2015-05-01 MED ORDER — ALBUTEROL SULFATE (2.5 MG/3ML) 0.083% IN NEBU: 2.5000 mg | INHALATION_SOLUTION | RESPIRATORY_TRACT | Status: DC | PRN

## 2015-05-01 MED ORDER — NITROGLYCERIN IN D5W 200-5 MCG/ML-% IV SOLN
0.0000 ug/min | INTRAVENOUS | Status: DC
Start: 2015-05-01 — End: 2015-05-03
  Administered 2015-05-01: 5 ug/min via INTRAVENOUS
  Filled 2015-05-01: qty 250

## 2015-05-01 MED ORDER — SPIRONOLACTONE 25 MG PO TABS
25.0000 mg | ORAL_TABLET | Freq: Every day | ORAL | Status: DC
  Administered 2015-05-04: 25 mg via ORAL
  Filled 2015-05-01: qty 1

## 2015-05-01 MED ORDER — DEXTROSE 5 % IV SOLN
1.0000 g | INTRAVENOUS | Status: DC
  Administered 2015-05-01: 1 g via INTRAVENOUS
  Filled 2015-05-01 (×2): qty 10

## 2015-05-01 MED ORDER — DEXTROSE 5 % IV SOLN
500.0000 mg | INTRAVENOUS | Status: DC
  Administered 2015-05-01: 500 mg via INTRAVENOUS
  Filled 2015-05-01 (×2): qty 500

## 2015-05-01 MED ORDER — ALBUTEROL SULFATE HFA 108 (90 BASE) MCG/ACT IN AERS: 2.0000 | INHALATION_SPRAY | Freq: Four times a day (QID) | RESPIRATORY_TRACT | Status: DC | PRN

## 2015-05-01 MED ORDER — ASPIRIN EC 81 MG PO TBEC: 81.0000 mg | DELAYED_RELEASE_TABLET | Freq: Every day | ORAL | Status: DC

## 2015-05-01 MED ORDER — METHYLPREDNISOLONE SODIUM SUCC 125 MG IJ SOLR
60.0000 mg | Freq: Four times a day (QID) | INTRAMUSCULAR | Status: DC
  Administered 2015-05-01 – 2015-05-02 (×3): 60 mg via INTRAVENOUS
  Filled 2015-05-01 (×3): qty 2

## 2015-05-01 MED ORDER — SODIUM CHLORIDE 0.9 % IV BOLUS (SEPSIS)
1000.0000 mL | Freq: Once | INTRAVENOUS | Status: AC
  Administered 2015-05-01: 1000 mL via INTRAVENOUS

## 2015-05-01 MED ORDER — ACETAMINOPHEN 325 MG PO TABS
650.0000 mg | ORAL_TABLET | Freq: Four times a day (QID) | ORAL | Status: DC | PRN
  Administered 2015-05-03 (×2): 650 mg via ORAL
  Filled 2015-05-01 (×2): qty 2

## 2015-05-01 MED ORDER — PROPOFOL 1000 MG/100ML IV EMUL: 5.0000 ug/kg/min | INTRAVENOUS | Status: DC

## 2015-05-01 MED ORDER — AMLODIPINE BESYLATE 5 MG PO TABS
5.0000 mg | ORAL_TABLET | Freq: Every day | ORAL | Status: DC
  Administered 2015-05-03: 5 mg via ORAL
  Filled 2015-05-01: qty 1

## 2015-05-01 MED ORDER — SODIUM CHLORIDE 0.9 % IJ SOLN
3.0000 mL | Freq: Two times a day (BID) | INTRAMUSCULAR | Status: DC
  Administered 2015-05-01 – 2015-05-03 (×4): 3 mL via INTRAVENOUS

## 2015-05-01 MED ORDER — TIOTROPIUM BROMIDE MONOHYDRATE 18 MCG IN CAPS
18.0000 ug | ORAL_CAPSULE | Freq: Every day | RESPIRATORY_TRACT | Status: DC
  Administered 2015-05-03 – 2015-05-05 (×3): 18 ug via RESPIRATORY_TRACT
  Filled 2015-05-01: qty 5

## 2015-05-01 MED ORDER — ENOXAPARIN SODIUM 40 MG/0.4ML ~~LOC~~ SOLN
40.0000 mg | Freq: Two times a day (BID) | SUBCUTANEOUS | Status: DC
  Administered 2015-05-01 – 2015-05-05 (×8): 40 mg via SUBCUTANEOUS
  Filled 2015-05-01 (×8): qty 0.4

## 2015-05-01 NOTE — ED Notes (Signed)
Pt on bipap, tolerating well, o2 sat 93%

## 2015-05-01 NOTE — ED Notes (Signed)
Trop.0.06 .Marland Kitchenreported to [facility]

## 2015-05-01 NOTE — ED Provider Notes (Addendum)
[facility] Emergency Department Provider Note  ____________________________________________  Time seen: 1635 on arrival  I have reviewed the triage vital signs and the nursing notes.  History from paramedics. Additional information from the patient is helpful but limited.  HISTORY  Chief Complaint Altered Mental Status Difficulty breathing Back pain    HPI Gary Juarez is a 54 y.o. male was brought in by EMS after being called for difficulty breathing. It is unclear to me currently who called 911. The patient seems incapable of having place a call given his notable altered mental status. The patient does apparently have a history of respiratory problems. While his communication is fairly limited, he can tell me that he does use oxygen at home. He also uses albuterol.  The patient has a history of back pain. He tells me, in his limited communication, that that is what is bothering him the most currently. He reports the back pain has been occurring for 2-3 days.  When EMS arrived at the scene, the patient had limited communication. He had a notably low oxygen level at 84%. He was not on oxygen at that time. EMS placed oxygen on him which brought his O2 sat up to 91 or 92%. They also gave him 1 mg of Narcan, for which they received a mild to moderate response. The patient is not on any known narcotics.   Past Medical History  Diagnosis Date  . Hypertension   . COPD (chronic obstructive pulmonary disease)   . Chronic kidney disease   . Chronic back pain greater than 3 months duration   . Phlebitis 1992    left calf  . Anxiety and depression     Patient Active Problem List   Diagnosis Date Noted  . Chronic diastolic heart failure 04/05/2015  . Back pain 11/30/2014  . HTN (hypertension) 11/30/2014    Past Surgical History  Procedure Laterality Date  . Wisdom tooth extraction      Current Outpatient Rx  Name  Route  Sig  Dispense  Refill  .  acetaminophen (TYLENOL) 325 MG tablet   Oral   Take 650 mg by mouth every 6 (six) hours as needed.         Marland Kitchen albuterol (PROVENTIL HFA;VENTOLIN HFA) 108 (90 BASE) MCG/ACT inhaler   Inhalation   Inhale 2 puffs into the lungs every 6 (six) hours as needed for wheezing or shortness of breath.         Marland Kitchen amLODipine (NORVASC) 5 MG tablet   Oral   Take 5 mg by mouth daily.         Marland Kitchen aspirin EC 81 MG tablet   Oral   Take 81 mg by mouth daily.         Marland Kitchen atorvastatin (LIPITOR) 20 MG tablet   Oral   Take 20 mg by mouth daily.         . carvedilol (COREG) 3.125 MG tablet   Oral   Take 12.5 mg by mouth 2 (two) times daily with a meal.          . cyclobenzaprine (FLEXERIL) 10 MG tablet   Oral   Take 10 mg by mouth 3 (three) times daily as needed for muscle spasms.         . fluticasone (VERAMYST) 27.5 MCG/SPRAY nasal spray   Nasal   Place 2 sprays into the nose as needed for rhinitis.         . furosemide (LASIX) 40 MG tablet  Oral   Take 40 mg by mouth 2 (two) times daily.         Marland Kitchen gabapentin (NEURONTIN) 300 MG capsule   Oral   Take 300 mg by mouth 3 (three) times daily.         Marland Kitchen HYDROcodone-acetaminophen (NORCO) 5-325 MG per tablet   Oral   Take 1 tablet by mouth every 6 (six) hours as needed for moderate pain.   12 tablet   0   . pantoprazole (PROTONIX) 40 MG tablet   Oral   Take 40 mg by mouth daily.         Marland Kitchen spironolactone (ALDACTONE) 25 MG tablet   Oral   Take 25 mg by mouth daily.         Marland Kitchen tiotropium (SPIRIVA) 18 MCG inhalation capsule   Inhalation   Place 18 mcg into inhaler and inhale daily.           Allergies Morphine and related  Family History  Problem Relation Age of Onset  . Stroke Mother   . Cancer Mother     Breast    Social History Social History  Substance Use Topics  . Smoking status: Former Smoker -- 0.25 packs/day for 20 years    Types: Cigarettes    Quit date: 12/13/2014  . Smokeless tobacco: Never Used   . Alcohol Use: No    Review of Systems Review of systems not possible due to the patient's altered mental status, impairment. ____________________________________________   PHYSICAL EXAM:  VITAL SIGNS: ED Triage Vitals  Enc Vitals Group     BP 05/01/15 1639 203/93 mmHg     Pulse Rate 05/01/15 1639 72     Resp 05/01/15 1639 24     Temp 05/01/15 1639 99.4 F (37.4 C)     Temp Source 05/01/15 1639 Oral     SpO2 05/01/15 1639 92 %     Weight 05/01/15 1639 325 lb (147.419 kg)     Height 05/01/15 1639 5\' 11"  (1.803 m)     Head Cir --      Peak Flow --      Pain Score 05/01/15 1640 7     Pain Loc --      Pain Edu? --      Excl. in GC? --     Constitutional:  Patient appears impaired, not fully alert, responds to voice intermittently and to noxious stimuli. Normal respiratory effort, which currently is insufficient.  ENT   Head: Normocephalic and atraumatic.   Nose: No congestion/rhinnorhea.   Mouth/Throat: Mucous membranes are moist. Eyes: Normal appearance, no icterus. Pupils 3-4 mm and responsive. Cardiovascular: Normal rate at 72, regular rhythm, no murmur noted Respiratory:  Normal respiratory effort but decreased tidal volume, no tachypnea.    Breath sounds are clear and equal bilaterally.  Gastrointestinal: Soft, large abdomen, mild to moderate tenderness, worse on the left.  Back: No muscle spasm, no tenderness, no CVA tenderness. Musculoskeletal: No deformity noted. Nontender with normal range of motion in all extremities. 1-2+ pitting edema in the lower extremities Neurologic:  Altered mentation and communication. Does follow commands a little bit. Has positive motion in all 4 extremities. Skin:  Skin is warm, dry. No rash noted. Psychiatric: Impaired, limited communication. Further exam deferred  ____________________________________________    LABS (pertinent positives/negatives)  Labs Reviewed  TROPONIN I - Abnormal; Notable for the following:     Troponin I 0.06 (*)    All other components within normal limits  BRAIN NATRIURETIC PEPTIDE - Abnormal; Notable for the following:    B Natriuretic Peptide 1607.0 (*)    All other components within normal limits  COMPREHENSIVE METABOLIC PANEL - Abnormal; Notable for the following:    BUN 42 (*)    Creatinine, Ser 3.53 (*)    Calcium 8.8 (*)    Albumin 3.4 (*)    ALT 11 (*)    Total Bilirubin 1.4 (*)    GFR calc non Af Amer 18 (*)    GFR calc Af Amer 21 (*)    All other components within normal limits  ACETAMINOPHEN LEVEL - Abnormal; Notable for the following:    Acetaminophen (Tylenol), Serum <10 (*)    All other components within normal limits  BLOOD GAS, ARTERIAL - Abnormal; Notable for the following:    pH, Arterial 7.25 (*)    pCO2 arterial 54 (*)    pO2, Arterial 66 (*)    Acid-base deficit 4.2 (*)    Allens test (pass/fail) POSITIVE (*)    All other components within normal limits  CBC WITH DIFFERENTIAL/PLATELET - Abnormal; Notable for the following:    WBC 12.3 (*)    RBC 4.12 (*)    Hemoglobin 11.1 (*)    HCT 34.9 (*)    MCHC 31.8 (*)    RDW 20.7 (*)    Platelets 123 (*)    Neutro Abs 9.1 (*)    Monocytes Absolute 2.0 (*)    All other components within normal limits  LACTIC ACID, PLASMA  SALICYLATE LEVEL  ETHANOL  LACTIC ACID, PLASMA  URINALYSIS COMPLETEWITH MICROSCOPIC ([facility] ONLY)  URINE DRUG SCREEN, QUALITATIVE ([facility] ONLY)     ____________________________________________   EKG  ED ECG REPORT I, Keyanni Whittinghill W, the attending physician, personally viewed and interpreted this ECG.   Date: 05/01/2015  EKG Time: 1644  Rate: 73  Rhythm: Normal sinus rhythm  Axis: Normal  Intervals: Normal  ST&T Change: Flat T waves in V4 with slight inversion in V6.   ____________________________________________    RADIOLOGY  Head CT:  Chest x-ray:  ____________________________________________   PROCEDURES  CRITICAL CARE Performed by: Darien Ramus   Total critical care time: 40 minutes due to the critical nature of this patient's condition, including altered mental status, respiration with an O2 sat of 84%, close monitoring required, initiation of BiPAP, and complex differential diagnosis.  Critical care time was exclusive of separately billable procedures and treating other patients.  Critical care was necessary to treat or prevent imminent or life-threatening deterioration.  Critical care was time spent personally by me on the following activities: development of treatment plan with patient and/or surrogate as well as nursing, discussions with consultants, evaluation of patient's response to treatment, examination of patient, obtaining history from patient or surrogate, ordering and performing treatments and interventions, ordering and review of laboratory studies, ordering and review of radiographic studies, pulse oximetry and re-evaluation of patient's condition.   ____________________________________________   INITIAL IMPRESSION / ASSESSMENT AND PLAN / ED COURSE  Pertinent labs & imaging results that were available during my care of the patient were reviewed by me and considered in my medical decision making (see chart for details).  Ill-appearing impaired 26 her male male with back pain, restaurant failure with hypoxia and possibly hypercapnia, altered mental status.  We will obtain a head CT along with a chest x-ray and appropriate labs including alcohol, urine drug screen, Tylenol, and salicylates. Giving him supplemental oxygen currently. A blood gas is pending. I'm  suspicious of hypercapnia.  ----------------------------------------- 6:16 PM on 05/01/2015 -----------------------------------------  Blood tests show worsening renal function. His BUN is usually approximately 40 with creatinine of 2.5. BUN is 42 with creatinine of 3.5 today. His electrolytes are otherwise normal with potassium of 4.4. He doesn't elevated BNP  of 1607. Troponin 0.06.  Blood gas shows pH slightly low at 7.25 with CO2 high at 54. He is hypoxic with a P O2 of 66.  On reexamination at this time, the patient appears to be more alert and cognitive. He has rolled over on his side and is able to shift himself around in bed better. We will begin him on BiPAP at this time due to the hypercarbia and hypoxia. We will also speak with internal medicine for admission for his renal failure, respiratory failure, and slight elevation of troponin area.  ----------------------------------------- 7:44 PM on 05/01/2015 -----------------------------------------  The patient initially refused BiPAP. I consult the internal medicine and spoke with Dr. Suezanne Jacquet, who saw the patient and we discussed the need for additional respiratory support and considered intubation. The patient also refused intubation. At this time, while I was questioning his competence and I still feel he is impaired and unable to make such decisions, he is more alert and is significantly improved from when he first arrived. He reported he did not want to use BiPAP because "he didn't like it". I do not think the patient fully understands the nature of his respiratory failure.  After a lengthy conversation with him, the patient agreed to trial BiPAP. We will treat him with a little bit of Ativan to make the BiPAP more tolerable for him. ____________________________________________   FINAL CLINICAL IMPRESSION(S) / ED DIAGNOSES  Final diagnoses:  Respiratory failure with hypoxia and hypercapnia  Stupor  Bilateral low back pain without sciatica  Acute on chronic renal failure      Darien Ramus, MD 05/01/15 1820  Darien Ramus, MD 05/01/15 2229

## 2015-05-01 NOTE — ED Notes (Signed)
Dr. Carollee Massed at bedside to discuss further RT treatment

## 2015-05-01 NOTE — H&P (Addendum)
[facility]   PATIENT NAME: Gary Juarez    MR#:  [id number]  DATE OF BIRTH:  [birth year]  DATE OF ADMISSION:  05/01/2015  PRIMARY CARE PHYSICIAN: Verlee Monte, PA-C   REQUESTING/REFERRING PHYSICIAN:   CHIEF COMPLAINT:   Chief Complaint  Patient presents with  . Altered Mental Status    HISTORY OF PRESENT ILLNESS: Gary Juarez  is a 54 y.o. male with a unknown history, but based on patient's medication list its likely hypertension, hyperlipidemia, questionable cardiomyopathy who presents to the hospital with complaints of shortness of breath. Unfortunately, now much history is available since patient is obtunded, although he admits of having some cough with some sputum production. Denies any chest pains. In emergency room, he had ABGs done which revealed mild PCO2 elevation and acidosis as well as hypoxia. He was offered BiPAP, however, refused. Hospitalist services where contacted for admission, patient's chest x-ray is pending  PAST MEDICAL HISTORY:   Past Medical History  Diagnosis Date  . Hypertension   . COPD (chronic obstructive pulmonary disease)   . Chronic kidney disease   . Chronic back pain greater than 3 months duration   . Phlebitis 1992    left calf  . Anxiety and depression     PAST SURGICAL HISTORY:  Past Surgical History  Procedure Laterality Date  . Wisdom tooth extraction      SOCIAL HISTORY:  Social History  Substance Use Topics  . Smoking status: Former Smoker -- 0.25 packs/day for 20 years    Types: Cigarettes    Quit date: 12/13/2014  . Smokeless tobacco: Never Used  . Alcohol Use: No    FAMILY HISTORY:  Family History  Problem Relation Age of Onset  . Stroke Mother   . Cancer Mother     Breast    DRUG ALLERGIES:  Allergies  Allergen Reactions  . Morphine And Related Other (See Comments)    Reaction:  Headaches     Review of Systems  Unable to perform ROS: critical illness     MEDICATIONS AT HOME:  Prior to Admission medications   Medication Sig Start Date End Date Taking? Authorizing Provider  acetaminophen (TYLENOL) 325 MG tablet Take 650 mg by mouth every 6 (six) hours as needed for mild pain or headache.    Yes Historical Provider, MD  albuterol (PROVENTIL HFA;VENTOLIN HFA) 108 (90 BASE) MCG/ACT inhaler Inhale 2 puffs into the lungs every 6 (six) hours as needed for wheezing or shortness of breath.   Yes Historical Provider, MD  amLODipine (NORVASC) 5 MG tablet Take 5 mg by mouth daily.   Yes Historical Provider, MD  aspirin EC 81 MG tablet Take 81 mg by mouth daily.   Yes Historical Provider, MD  atorvastatin (LIPITOR) 20 MG tablet Take 20 mg by mouth at bedtime.    Yes Historical Provider, MD  carvedilol (COREG) 12.5 MG tablet Take 12.5 mg by mouth 2 (two) times daily.   Yes Historical Provider, MD  gabapentin (NEURONTIN) 300 MG capsule Take 300 mg by mouth 3 (three) times daily.   Yes Historical Provider, MD  pantoprazole (PROTONIX) 40 MG tablet Take 40 mg by mouth daily.   Yes Historical Provider, MD  spironolactone (ALDACTONE) 25 MG tablet Take 25 mg by mouth daily.   Yes Historical Provider, MD  tiotropium (SPIRIVA) 18 MCG inhalation capsule Place 18 mcg into inhaler and inhale daily.   Yes Historical Provider, MD  HYDROcodone-acetaminophen (NORCO) 5-325 MG per tablet  Take 1 tablet by mouth every 6 (six) hours as needed for moderate pain. Patient not taking: Reported on 05/01/2015 04/05/15   Delma Freeze, FNP      PHYSICAL EXAMINATION:   VITAL SIGNS: Blood pressure 203/93, pulse 72, temperature 99.4 F (37.4 C), temperature source Oral, resp. rate 24, height  (1.803 m), weight 147.419 kg (325 lb), SpO2 92 %.  GENERAL:  54 y.o.-year-old patient sitting on the bed in moderate to severe respiratory distress. Tachypneic as well as tachycardic. Systolic blood pressure is above 180, gasping breathing EYES: Pupils equal, round, reactive to light and  accommodation. No scleral icterus. Extraocular muscles intact.  HEENT: Head atraumatic, normocephalic. Oropharynx and nasopharynx clear.  NECK:  Supple, no jugular venous distention. No thyroid enlargement, no tenderness.  LUNGS: Markedly diminished breath sounds bilaterally, lateral wheezing, few rhonchi or crepitation. No use of accessory muscles of respiration.  CARDIOVASCULAR: S1, S2 normal. No murmurs, rubs, or gallops.  ABDOMEN: Soft, nontender, nondistended. Bowel sounds present. No organomegaly or mass.  EXTREMITIES: No pedal edema, cyanosis, or clubbing.  NEUROLOGIC: Cranial nerves II through XII are intact. Muscle strength 5/5 in all extremities. Sensation intact. Gait not checked.  PSYCHIATRIC: The patient is alert and oriented x 3.  SKIN: No obvious rash, lesion, or ulcer.   LABORATORY PANEL:   CBC  Recent Labs Lab 05/01/15 1651  WBC 12.3*  HGB 11.1*  HCT 34.9*  PLT 123*  MCV 84.6  MCH 26.9  MCHC 31.8*  RDW 20.7*  LYMPHSABS 1.1  MONOABS 2.0*  EOSABS 0.2  BASOSABS 0.0   ------------------------------------------------------------------------------------------------------------------  Chemistries   Recent Labs Lab 05/01/15 1651  NA 139  K 4.4  CL 110  CO2 22  GLUCOSE 85  BUN 42*  CREATININE 3.53*  CALCIUM 8.8*  AST 19  ALT 11*  ALKPHOS 105  BILITOT 1.4*   ------------------------------------------------------------------------------------------------------------------  Cardiac Enzymes  Recent Labs Lab 05/01/15 1651  TROPONINI 0.06*   ------------------------------------------------------------------------------------------------------------------  RADIOLOGY: Ct Head Wo Contrast  05/01/2015   CLINICAL DATA:  Patient with shortness of breath. Pinpoint pupils. Altered mental status.  EXAM: CT HEAD WITHOUT CONTRAST  TECHNIQUE: Contiguous axial images were obtained from the base of the skull through the vertex without intravenous contrast.   COMPARISON:  Brain CT 03/09/2011  FINDINGS: Limited evaluation secondary to technique. Within the right parietal lobe (image 21; series 4) there is a focal area of low-attenuation. 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 No evidence for acute intracranial hemorrhage, mass lesion or mass-effect. Orbits are unremarkable. Leftward deviation of the nasal septum. Mastoid air cells are unremarkable. Paranasal sinuses are unremarkable.  IMPRESSION: Focal area of low attenuation within the right parietal lobe which is indeterminate in etiology (possibly volume-averaging through sulci or potentially age-indeterminate infarct). Consider further evaluation if the patient is presenting with localized symptomatology.  No acute intracranial hemorrhage.   Electronically Signed   By: Annia Belt M.D.   On: 05/01/2015 17:31    EKG: Orders placed or performed during the hospital encounter of 05/01/15  . EKG 12-Lead  . EKG 12-Lead  . ED EKG  . ED EKG   EKG 7th of September 2016 showed normal sinus rhythm at 73 bpm, T-wave and hematocrit, consider lateral ischemia. No acute ST-T changes noted  IMPRESSION AND PLAN:  Active Problems:   Acute respiratory failure with hypercapnia 1. Acute respiratory failure with hypoxia and hypercapnia,, admitted to the intensive care unit,  intubater Patient now as patient is poorly awaken, get pulmonologist  involved for further recommendations 2. COPD exacerbation, chest x-rays pending, start patient on Solu-Medrol inhalation nebulizing therapy 3. Acute bronchitis, rule out pneumonia, initiate patient on broad-spectrum antibiotic therapy with Rocephin and Zithromax, get sputum cultures 4. Malignant essential hypertension. Initiate patient on nitroglycerin IV drip 5. Troponin initiate nitroglycerin. Continue patient on Coreg, Lipitor, aspirin 6. Altered mental status, likely metabolic encephalopathy, rule out drug abuse  Refused intubation finally agreed on BiPAP after  prolonged negotiation    All the records are reviewed and case discussed with ED provider. Management plans discussed with the patient, family and they are in agreement.  CODE STATUS: Full code    TOTAL CRITICAL CARE TIME TAKING CARE OF THIS PATIENT: 60 minutes.    Katharina Caper M.D on 05/01/2015 at 7:17 PM  Between 7am to 6pm - Pager - [phone] After 6pm go to [email or web address] - password [email or web address]  [facility]  Office  [phone]  CC: Primary care physician; Verlee Monte, PA-C

## 2015-05-01 NOTE — ED Notes (Signed)
Brought in via ems from home. Per ems they were called out for  Diff breathing . Was found outside .altered and pupils pinpoint.. Was given narcan and then began to complain of back pain. o2 sat 84% on room air and then placed on 5 liters .sat increased to 92%

## 2015-05-01 NOTE — Progress Notes (Signed)
ANTICOAGULATION CONSULT NOTE - Initial Consult  Pharmacy Consult for Lovenox Indication: VTE prophylaxis  Allergies  Allergen Reactions  . Morphine And Related Other (See Comments)    Reaction:  Headaches     Patient Measurements: Height:  (180.3 cm) Weight: (!) 325 lb (147.419 kg) IBW/kg (Calculated) : 75.3 Heparin Dosing Weight:   Vital Signs: Temp: 99.4 F (37.4 C) (09/07 1639) Temp Source: Oral (09/07 1639) BP: 177/93 mmHg (09/07 2040) Pulse Rate: 84 (09/07 2040)  Labs:  Recent Labs  05/01/15 1651  HGB 11.1*  HCT 34.9*  PLT 123*  CREATININE 3.53*  TROPONINI 0.06*    Estimated Creatinine Clearance: 34.8 mL/min (by C-G formula based on Cr of 3.53).   Medical History: Past Medical History  Diagnosis Date  . Hypertension   . COPD (chronic obstructive pulmonary disease)   . Chronic kidney disease   . Chronic back pain greater than 3 months duration   . Phlebitis 1992    left calf  . Anxiety and depression     Medications:  Prescriptions prior to admission  Medication Sig Dispense Refill Last Dose  . acetaminophen (TYLENOL) 325 MG tablet Take 650 mg by mouth every 6 (six) hours as needed for mild pain or headache.    PRN at PRN  . albuterol (PROVENTIL HFA;VENTOLIN HFA) 108 (90 BASE) MCG/ACT inhaler Inhale 2 puffs into the lungs every 6 (six) hours as needed for wheezing or shortness of breath.   PRN at PRN  . amLODipine (NORVASC) 5 MG tablet Take 5 mg by mouth daily.   unknown at unknown  . aspirin EC 81 MG tablet Take 81 mg by mouth daily.   unknown at unknown  . atorvastatin (LIPITOR) 20 MG tablet Take 20 mg by mouth at bedtime.    unknown at unknown  . carvedilol (COREG) 12.5 MG tablet Take 12.5 mg by mouth 2 (two) times daily.   unknown at unknown  . gabapentin (NEURONTIN) 300 MG capsule Take 300 mg by mouth 3 (three) times daily.   unknown at unknown  . pantoprazole (PROTONIX) 40 MG tablet Take 40 mg by mouth daily.   unknown at unknown  .  spironolactone (ALDACTONE) 25 MG tablet Take 25 mg by mouth daily.   unknown at unknown  . tiotropium (SPIRIVA) 18 MCG inhalation capsule Place 18 mcg into inhaler and inhale daily.   unknown at unknown  . HYDROcodone-acetaminophen (NORCO) 5-325 MG per tablet Take 1 tablet by mouth every 6 (six) hours as needed for moderate pain. (Patient not taking: Reported on 05/01/2015) 12 tablet 0     Assessment: CrCl = 34.8 ml/min BMI = 45.4   Goal of Therapy:  DVT prophylaxis   Plan:  Lovenox 40 mg SQ Q24H originally ordered.  Will adjust dose to Lovenox 40 mg SQ Q12H based on BMI > 45.4 .   Shaketa Serafin D 05/01/2015,9:37 PM

## 2015-05-01 NOTE — ED Notes (Signed)
Per ems he became more alert after narcan ..states he hurts all over moaning but speech is clearing up

## 2015-05-01 NOTE — ED Notes (Signed)
Pt on 4L [place], pt somnolent with pursed lip breathing, RT and MD at bedside, pt placed on bipap

## 2015-05-02 ENCOUNTER — Inpatient Hospital Stay
Admit: 2015-05-02 | Discharge: 2015-05-02 | Disposition: A | Discharge status: Home / Self Care | Payer: Self-pay | Attending: Internal Medicine | Admitting: Internal Medicine

## 2015-05-02 ENCOUNTER — Inpatient Hospital Stay: Payer: MEDICAID

## 2015-05-02 DIAGNOSIS — J9602 Acute respiratory failure with hypercapnia: Secondary | ICD-10-CM

## 2015-05-02 DIAGNOSIS — J441 Chronic obstructive pulmonary disease with (acute) exacerbation: Secondary | ICD-10-CM

## 2015-05-02 LAB — BLOOD GAS, ARTERIAL
ACID-BASE DEFICIT: 4.9 mmol/L — AB (ref 0.0–2.0)
Acid-base deficit: 4.9 mmol/L — ABNORMAL HIGH (ref 0.0–2.0)
Acid-base deficit: 4.8 mmol/L — ABNORMAL HIGH (ref 0.0–2.0)
Allens test (pass/fail): POSITIVE — AB
Allens test (pass/fail): POSITIVE — AB
BICARBONATE: 24 meq/L (ref 21.0–28.0)
Bicarbonate: 24.2 mEq/L (ref 21.0–28.0)
Bicarbonate: 22.8 mEq/L (ref 21.0–28.0)
DELIVERY SYSTEMS: POSITIVE
Delivery systems: POSITIVE
EXPIRATORY PAP: 5
Expiratory PAP: 5
Expiratory PAP: 5
FIO2: 0.3
FIO2: 30
FIO2: 0.3
INSPIRATORY PAP: 16
Inspiratory PAP: 10
Inspiratory PAP: 16
Mechanical Rate: 8
Mechanical Rate: 8
Mode: POSITIVE
O2 SAT: 85.2 %
O2 SAT: 87.8 %
O2 Saturation: 83.9 %
PATIENT TEMPERATURE: 37
PCO2 ART: 52 mmHg — AB (ref 32.0–48.0)
PH ART: 7.25 — AB (ref 7.350–7.450)
PH ART: 7.21 — AB (ref 7.350–7.450)
PO2 ART: 59 mmHg — AB (ref 83.0–108.0)
PO2 ART: 66 mmHg — AB (ref 83.0–108.0)
Patient temperature: 37
Patient temperature: 37
RATE: 8 resp/min
pCO2 arterial: 62 mmHg — ABNORMAL HIGH (ref 32.0–48.0)
pCO2 arterial: 60 mmHg — ABNORMAL HIGH (ref 32.0–48.0)
pH, Arterial: 7.2 — ABNORMAL LOW (ref 7.350–7.450)
pO2, Arterial: 60 mmHg — ABNORMAL LOW (ref 83.0–108.0)

## 2015-05-02 LAB — CBC
HCT: 35.4 % — ABNORMAL LOW (ref 40.0–52.0)
Hemoglobin: 11.4 g/dL — ABNORMAL LOW (ref 13.0–18.0)
MCH: 27.6 pg (ref 26.0–34.0)
MCHC: 32.3 g/dL (ref 32.0–36.0)
MCV: 85.5 fL (ref 80.0–100.0)
PLATELETS: 89 10*3/uL — AB (ref 150–440)
RBC: 4.14 MIL/uL — AB (ref 4.40–5.90)
RDW: 20.8 % — AB (ref 11.5–14.5)
WBC: 9.8 10*3/uL (ref 3.8–10.6)

## 2015-05-02 LAB — BASIC METABOLIC PANEL
Anion gap: 6 (ref 5–15)
BUN: 46 mg/dL — AB (ref 6–20)
CALCIUM: 8.5 mg/dL — AB (ref 8.9–10.3)
CHLORIDE: 109 mmol/L (ref 101–111)
CO2: 24 mmol/L (ref 22–32)
CREATININE: 3.64 mg/dL — AB (ref 0.61–1.24)
GFR calc non Af Amer: 17 mL/min — ABNORMAL LOW (ref >60)
GFR, EST AFRICAN AMERICAN: 20 mL/min — AB (ref >60)
Glucose, Bld: 129 mg/dL — ABNORMAL HIGH (ref 65–99)
Potassium: 4.8 mmol/L (ref 3.5–5.1)
SODIUM: 139 mmol/L (ref 135–145)

## 2015-05-02 LAB — MRSA PCR SCREENING: MRSA BY PCR: NEGATIVE

## 2015-05-02 LAB — TROPONIN I
Troponin I: 0.03 ng/mL (ref <0.031)
Troponin I: 0.03 ng/mL (ref <0.031)

## 2015-05-02 LAB — CKMB (ARMC ONLY): CK, MB: 3 ng/mL (ref 0.5–5.0)

## 2015-05-02 LAB — LACTIC ACID, PLASMA: Lactic Acid, Venous: 1 mmol/L (ref 0.5–2.0)

## 2015-05-02 MED ORDER — ANTISEPTIC ORAL RINSE SOLUTION (CORINZ)
7.0000 mL | Freq: Four times a day (QID) | OROMUCOSAL | Status: DC
  Administered 2015-05-02 – 2015-05-03 (×2): 7 mL via OROMUCOSAL
  Filled 2015-05-02 (×11): qty 7

## 2015-05-02 MED ORDER — PANTOPRAZOLE SODIUM 40 MG IV SOLR
40.0000 mg | INTRAVENOUS | Status: DC
  Administered 2015-05-02: 40 mg via INTRAVENOUS
  Filled 2015-05-02: qty 40

## 2015-05-02 MED ORDER — METHYLPREDNISOLONE SODIUM SUCC 125 MG IJ SOLR
60.0000 mg | INTRAMUSCULAR | Status: DC
  Administered 2015-05-03: 60 mg via INTRAVENOUS
  Filled 2015-05-02: qty 2

## 2015-05-02 MED ORDER — CHLORHEXIDINE GLUCONATE 0.12% ORAL RINSE (MEDLINE KIT)
15.0000 mL | Freq: Two times a day (BID) | OROMUCOSAL | Status: DC
  Administered 2015-05-03: 15 mL via OROMUCOSAL
  Filled 2015-05-02 (×6): qty 15

## 2015-05-02 MED ORDER — BUDESONIDE 0.5 MG/2ML IN SUSP
0.5000 mg | Freq: Two times a day (BID) | RESPIRATORY_TRACT | Status: DC
  Administered 2015-05-02 – 2015-05-03 (×2): 0.5 mg via RESPIRATORY_TRACT
  Filled 2015-05-02 (×2): qty 2

## 2015-05-02 MED ORDER — FUROSEMIDE 10 MG/ML IJ SOLN
40.0000 mg | Freq: Two times a day (BID) | INTRAMUSCULAR | Status: DC
  Administered 2015-05-02 – 2015-05-03 (×2): 40 mg via INTRAVENOUS
  Filled 2015-05-02 (×2): qty 4

## 2015-05-02 NOTE — Consult Note (Signed)
[facility] Consultation      Name: Gary Juarez MRN: [id number] DOB: [birth year]    ADMISSION DATE:  05/01/2015  CHIEF COMPLAINT:  resp failure   HISTORY OF PRESENT ILLNESS  54 yo obese white admitted to ICU for acute sob, patient on bipap, ABG c/w acute resp failure Patient lethargic, patient with h/o COPD  biPAP setting adjusted, TV increased, repeat ABg pending. Patient at high risk for intubation    SIGNIFICANT EVENTS     PAST MEDICAL HISTORY    :  Past Medical History  Diagnosis Date  . Hypertension   . COPD (chronic obstructive pulmonary disease)   . Chronic kidney disease   . Chronic back pain greater than 3 months duration   . Phlebitis 1992    left calf  . Anxiety and depression    Past Surgical History  Procedure Laterality Date  . Wisdom tooth extraction     Prior to Admission medications   Medication Sig Start Date End Date Taking? Authorizing Provider  acetaminophen (TYLENOL) 325 MG tablet Take 650 mg by mouth every 6 (six) hours as needed for mild pain or headache.    Yes Historical Provider, MD  albuterol (PROVENTIL HFA;VENTOLIN HFA) 108 (90 BASE) MCG/ACT inhaler Inhale 2 puffs into the lungs every 6 (six) hours as needed for wheezing or shortness of breath.   Yes Historical Provider, MD  amLODipine (NORVASC) 5 MG tablet Take 5 mg by mouth daily.   Yes Historical Provider, MD  aspirin EC 81 MG tablet Take 81 mg by mouth daily.   Yes Historical Provider, MD  atorvastatin (LIPITOR) 20 MG tablet Take 20 mg by mouth at bedtime.    Yes Historical Provider, MD  carvedilol (COREG) 12.5 MG tablet Take 12.5 mg by mouth 2 (two) times daily.   Yes Historical Provider, MD  gabapentin (NEURONTIN) 300 MG capsule Take 300 mg by mouth 3 (three) times daily.   Yes Historical Provider, MD  pantoprazole (PROTONIX) 40 MG tablet Take 40 mg by mouth daily.   Yes Historical Provider, MD  spironolactone (ALDACTONE) 25 MG tablet Take 25 mg by mouth  daily.   Yes Historical Provider, MD  tiotropium (SPIRIVA) 18 MCG inhalation capsule Place 18 mcg into inhaler and inhale daily.   Yes Historical Provider, MD  HYDROcodone-acetaminophen (NORCO) 5-325 MG per tablet Take 1 tablet by mouth every 6 (six) hours as needed for moderate pain. Patient not taking: Reported on 05/01/2015 04/05/15   Delma Freeze, FNP   Allergies  Allergen Reactions  . Morphine And Related Other (See Comments)    Reaction:  Headaches      FAMILY HISTORY   Family History  Problem Relation Age of Onset  . Stroke Mother   . Cancer Mother     Breast      SOCIAL HISTORY    reports that he quit smoking about 4 months ago. His smoking use included Cigarettes. He has a 5 pack-year smoking history. He has never used smokeless tobacco. He reports that he does not drink alcohol or use illicit drugs.  Review of Systems  Unable to perform ROS: critical illness      VITAL SIGNS    Temp:  [98.6 F (37 C)-99.4 F (37.4 C)] 98.6 F (37 C) (09/08 0300) Pulse Rate:  [61-84] 62 (09/08 0800) Resp:  [17-32] 19 (09/08 0800) BP: (107-203)/(73-144) 186/96 mmHg (09/08 0800) SpO2:  [88 %-93 %] 93 % (09/08 0800) FiO2 (%):  [30 %] 30 % (  09/08 0800) Weight:  [325 lb (147.419 kg)-357 lb 12.9 oz (162.3 kg)] 355 lb 13.2 oz (161.4 kg) (09/08 0539) HEMODYNAMICS:   VENTILATOR SETTINGS: Vent Mode:  [-]  FiO2 (%):  [30 %] 30 % INTAKE / OUTPUT:  Intake/Output Summary (Last 24 hours) at 05/02/15 0858 Last data filed at 05/02/15 0800  Gross per 24 hour  Intake    300 ml  Output   1500 ml  Net  -1200 ml       PHYSICAL EXAM   Physical Exam  Constitutional: He appears distressed.  HENT:  Head: Normocephalic and atraumatic.  Eyes: Pupils are equal, round, and reactive to light. No scleral icterus.  Neck: Normal range of motion. Neck supple.  Cardiovascular: Normal rate and regular rhythm.   No murmur heard. Pulmonary/Chest: He is in respiratory distress. He has no  wheezes. He has rales.  resp distress  Abdominal: Soft. He exhibits no distension. There is no tenderness.  Musculoskeletal: He exhibits no edema.  Neurological: He displays normal reflexes. Coordination normal.  lethargic  Skin: Skin is warm. No rash noted. He is diaphoretic.       LABS   LABS:  CBC  Recent Labs Lab 05/01/15 1651 05/01/15 2212 05/02/15 0429  WBC 12.3* 11.3* 9.8  HGB 11.1* 11.2* 11.4*  HCT 34.9* 35.0* 35.4*  PLT 123* 99* 89*   Coag's No results for input(s): APTT, INR in the last 168 hours. BMET  Recent Labs Lab 05/01/15 1651 05/01/15 2212 05/02/15 0429  NA 139  --  139  K 4.4  --  4.8  CL 110  --  109  CO2 22  --  24  BUN 42*  --  46*  CREATININE 3.53* 3.51* 3.64*  GLUCOSE 85  --  129*   Electrolytes  Recent Labs Lab 05/01/15 1651 05/02/15 0429  CALCIUM 8.8* 8.5*   Sepsis Markers  Recent Labs Lab 05/01/15 1652 05/01/15 1946  LATICACIDVEN 1.0 1.0   ABG  Recent Labs Lab 05/01/15 1755 05/02/15 0647  PHART 7.25* 7.20*  PCO2ART 54* 62*  PO2ART 66* 60*   Liver Enzymes  Recent Labs Lab 05/01/15 1651  AST 19  ALT 11*  ALKPHOS 105  BILITOT 1.4*  ALBUMIN 3.4*   Cardiac Enzymes  Recent Labs Lab 05/01/15 1651  TROPONINI 0.06*   Glucose No results for input(s): GLUCAP in the last 168 hours.   Recent Results (from the past 240 hour(s))  MRSA PCR Screening     Status: None   Collection Time: 05/01/15  9:42 PM  Result Value Ref Range Status   MRSA by PCR NEGATIVE NEGATIVE Final    Comment:        The GeneXpert MRSA Assay (FDA approved for NASAL specimens only), is one component of a comprehensive MRSA colonization surveillance program. It is not intended to diagnose MRSA infection nor to guide or monitor treatment for MRSA infections.      Current facility-administered medications:  .  acetaminophen (TYLENOL) tablet 650 mg, 650 mg, Oral, Q6H PRN, Katharina Caper, MD, 650 mg at 05/02/15 0817 .  albuterol  (PROVENTIL) (2.5 MG/3ML) 0.083% nebulizer solution 2.5 mg, 2.5 mg, Nebulization, Q2H PRN, Katharina Caper, MD .  albuterol (PROVENTIL) (2.5 MG/3ML) 0.083% nebulizer solution 2.5 mg, 2.5 mg, Nebulization, Q4H, Katharina Caper, MD, 2.5 mg at 05/02/15 0744 .  amLODipine (NORVASC) tablet 5 mg, 5 mg, Oral, Daily, Katharina Caper, MD, 5 mg at 05/01/15 2218 .  aspirin chewable tablet 81 mg, 81 mg, Oral,  Daily, Katharina Caper, MD, 81 mg at 05/01/15 2227 .  aspirin EC tablet 81 mg, 81 mg, Oral, Daily, Katharina Caper, MD, 81 mg at 05/01/15 2219 .  atorvastatin (LIPITOR) tablet 20 mg, 20 mg, Oral, QHS, Katharina Caper, MD, 20 mg at 05/01/15 2219 .  azithromycin (ZITHROMAX) 500 mg in dextrose 5 % 250 mL IVPB, 500 mg, Intravenous, Q24H, Katharina Caper, MD, Last Rate: 250 mL/hr at 05/01/15 1952, 500 mg at 05/01/15 1952 .  budesonide (PULMICORT) nebulizer solution 0.25 mg, 0.25 mg, Nebulization, BID, Katharina Caper, MD, 0.25 mg at 05/02/15 0745 .  carvedilol (COREG) tablet 12.5 mg, 12.5 mg, Oral, BID, Katharina Caper, MD, 12.5 mg at 05/01/15 2219 .  cefTRIAXone (ROCEPHIN) 1 g in dextrose 5 % 50 mL IVPB, 1 g, Intravenous, Q24H, Katharina Caper, MD, Last Rate: 100 mL/hr at 05/01/15 1954, 1 g at 05/01/15 1954 .  enoxaparin (LOVENOX) injection 40 mg, 40 mg, Subcutaneous, Q12H, Katharina Caper, MD, 40 mg at 05/01/15 2222 .  furosemide (LASIX) injection 40 mg, 40 mg, Intravenous, 3 times per day, Katharina Caper, MD, 40 mg at 05/02/15 0606 .  hydrALAZINE (APRESOLINE) injection 10 mg, 10 mg, Intravenous, Q4H PRN, Wyatt Haste, MD .  LORazepam (ATIVAN) injection 0.5 mg, 0.5 mg, Intravenous, Q4H PRN, Wyatt Haste, MD, 0.5 mg at 05/01/15 2332 .  methylPREDNISolone sodium succinate (SOLU-MEDROL) 125 mg/2 mL injection 60 mg, 60 mg, Intravenous, Q6H, Katharina Caper, MD, 60 mg at 05/02/15 0606 .  nitroGLYCERIN 50 mg in dextrose 5 % 250 mL (0.2 mg/mL) infusion, 0-200 mcg/min, Intravenous, Titrated, Katharina Caper, MD, Last Rate: 1.5 mL/hr at 05/01/15  2031, 5 mcg/min at 05/01/15 2031 .  pantoprazole (PROTONIX) EC tablet 40 mg, 40 mg, Oral, Daily, Katharina Caper, MD, 40 mg at 05/01/15 2217 .  propofol (DIPRIVAN) 1000 MG/100ML infusion, 5-80 mcg/kg/min, Intravenous, Titrated, Katharina Caper, MD, 5 mcg/kg/min at 05/01/15 2214 .  sodium chloride 0.9 % injection 3 mL, 3 mL, Intravenous, Q12H, Katharina Caper, MD, 3 mL at 05/01/15 2225 .  spironolactone (ALDACTONE) tablet 25 mg, 25 mg, Oral, Daily, Katharina Caper, MD, 25 mg at 05/01/15 2218 .  tiotropium (SPIRIVA) inhalation capsule 18 mcg, 18 mcg, Inhalation, Daily, Katharina Caper, MD, 18 mcg at 05/02/15 0800  IMAGING    Ct Head Wo Contrast  05/01/2015   CLINICAL DATA:  Patient with shortness of breath. Pinpoint pupils. Altered mental status.  EXAM: CT HEAD WITHOUT CONTRAST  TECHNIQUE: Contiguous axial images were obtained from the base of the skull through the vertex without intravenous contrast.  COMPARISON:  Brain CT 03/09/2011  FINDINGS: Limited evaluation secondary to technique. Within the right parietal lobe (image 21; series 4) there is a focal area of low-attenuation. 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 No evidence for acute intracranial hemorrhage, mass lesion or mass-effect. Orbits are unremarkable. Leftward deviation of the nasal septum. Mastoid air cells are unremarkable. Paranasal sinuses are unremarkable.  IMPRESSION: Focal area of low attenuation within the right parietal lobe which is indeterminate in etiology (possibly volume-averaging through sulci or potentially age-indeterminate infarct). Consider further evaluation if the patient is presenting with localized symptomatology.  No acute intracranial hemorrhage.   Electronically Signed   By: Annia Belt M.D.   On: 05/01/2015 17:31   Dg Chest Port 1 View  05/01/2015   CLINICAL DATA:  Difficulty breathing  EXAM: PORTABLE CHEST - 1 VIEW  COMPARISON:  11/25/2014  FINDINGS: Severe cardiac enlargement stable. Moderate vascular congestion  similar to prior study. Prominent pulmonary arteries.  Left lung base not well evaluated. Possible air something ring superimposition of overlying soft tissues.  IMPRESSION: Similar to prior study 0 significant cardiac enlargement with vascular congestion. There may be pulmonary edema.  Asymmetric opacity over the right lung base may represent a combination of infiltrate or asymmetric edema +superimposed soft tissues.   Electronically Signed   By: Esperanza Heir M.D.   On: 05/01/2015 19:25        ASSESSMENT/PLAN   54 yo white male with acute resp hypercapnic resp failure with acute CHF exacerbation with probaqble underlying pneumonia/bronchitis   PULMONARY-COPD exacerbation/chf exacerbation-high risk for intubation biPAP as needed -follow up ABG -oxygen as needed -iv steroids -BD therapy -spiriva   CARDIOVASCULAR -Needs icu monitoring  RENAL Follow chem 7 -follow UO  GASTROINTESTINAL Keep NPO for now  HEMATOLOGIC -Follow h/h  INFECTIOUS -empirix therapy for pneumonia  ENDOCRINE Follow fsbs  NEUROLOGIC Encephalopathy from acidosis -monitor closely avoid sedatives    I have personally obtained a history, examined the patient, evaluated laboratory and independently reviewed  imaging results, formulated the assessment and plan and placed orders.  The Patient requires high complexity decision making for assessment and support, frequent evaluation and titration of therapies, application of advanced monitoring technologies and extensive interpretation of multiple databases. Critical Care Time devoted to patient care services described in this note is 45  minutes.   Overall, patient is critically ill, prognosis is guarded.     Lucie Leather, M.D.  [facility]  Medical Director [facility] Medical Director [facility] Cardio-Pulmonary Department

## 2015-05-02 NOTE — Progress Notes (Signed)
*  PRELIMINARY RESULTS* Echocardiogram 2D Echocardiogram has been performed.  Georgann Housekeeper Hege 05/02/2015, 12:44 PM

## 2015-05-02 NOTE — Progress Notes (Addendum)
[facility] [place] at [facility]   PATIENT NAME: Gary Juarez    MR#:  [id number]  DATE OF BIRTH:  02/15/1961  SUBJECTIVE:  Came in with increasing sob and lethargy. On bipap. Opens eyes momentarily and falls back to sleep. No fever  REVIEW OF SYSTEMS:   Review of Systems  Unable to perform ROS: mental acuity   Tolerating Diet:npo Tolerating PT: not done yet  DRUG ALLERGIES:   Allergies  Allergen Reactions  . Morphine And Related Other (See Comments)    Reaction:  Headaches     VITALS:  Blood pressure 142/77, pulse 62, temperature 99.2 F (37.3 C), temperature source Axillary, resp. rate 17, height  (1.803 m), weight 161.4 kg (355 lb 13.2 oz), SpO2 94 %.  PHYSICAL EXAMINATION:   Physical Exam  GENERAL:  54 y.o.-year-old patient lying in the bed with no acute distress. Obese, disheleved EYES: Pupils equal, round, reactive to light and accommodation. No scleral icterus. Extraocular muscles intact.  HEENT: Head atraumatic, normocephalic. Oropharynx and nasopharynx clear. BIPAP+ NECK:  Supple, no jugular venous distention. No thyroid enlargement, no tenderness.  LUNGS: Normal breath sounds bilaterally, no wheezing, rales, rhonchi. No use of accessory muscles of respiration.  CARDIOVASCULAR: S1, S2 normal. No murmurs, rubs, or gallops.  ABDOMEN: Soft, nontender, nondistended. Bowel sounds present. No organomegaly or mass. obesity EXTREMITIES: +++ edema b/l.    NEUROLOGIC: cannot assess due to lethargy PSYCHIATRIC: lethargic SKIN: No obvious rash, lesion, or ulcer.    LABORATORY PANEL:   CBC  Recent Labs Lab 05/02/15 0429  WBC 9.8  HGB 11.4*  HCT 35.4*  PLT 89*    Chemistries   Recent Labs Lab 05/01/15 1651  05/02/15 0429  NA 139  --  139  K 4.4  --  4.8  CL 110  --  109  CO2 22  --  24  GLUCOSE 85  --  129*  BUN 42*  --  46*  CREATININE 3.53*  < > 3.64*  CALCIUM 8.8*  --  8.5*  AST 19  --   --   ALT 11*  --   --    ALKPHOS 105  --   --   BILITOT 1.4*  --   --   < > = values in this interval not displayed.  Cardiac Enzymes  Recent Labs Lab 05/02/15 1040  TROPONINI 0.03    RADIOLOGY:  Ct Head Wo Contrast  05/01/2015   CLINICAL DATA:  Patient with shortness of breath. Pinpoint pupils. Altered mental status.  EXAM: CT HEAD WITHOUT CONTRAST  TECHNIQUE: Contiguous axial images were obtained from the base of the skull through the vertex without intravenous contrast.  COMPARISON:  Brain CT 03/09/2011  FINDINGS: Limited evaluation secondary to technique. Within the right parietal lobe (image 21; series 4) there is a focal area of low-attenuation. 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 No evidence for acute intracranial hemorrhage, mass lesion or mass-effect. Orbits are unremarkable. Leftward deviation of the nasal septum. Mastoid air cells are unremarkable. Paranasal sinuses are unremarkable.  IMPRESSION: Focal area of low attenuation within the right parietal lobe which is indeterminate in etiology (possibly volume-averaging through sulci or potentially age-indeterminate infarct). Consider further evaluation if the patient is presenting with localized symptomatology.  No acute intracranial hemorrhage.   Electronically Signed   By: Annia Belt M.D.   On: 05/01/2015 17:31   Dg Chest Port 1 View  05/01/2015   CLINICAL DATA:  Difficulty breathing  EXAM:  PORTABLE CHEST - 1 VIEW  COMPARISON:  11/25/2014  FINDINGS: Severe cardiac enlargement stable. Moderate vascular congestion similar to prior study. Prominent pulmonary arteries. Left lung base not well evaluated. Possible air something ring superimposition of overlying soft tissues.  IMPRESSION: Similar to prior study 0 significant cardiac enlargement with vascular congestion. There may be pulmonary edema.  Asymmetric opacity over the right lung base may represent a combination of infiltrate or asymmetric edema +superimposed soft tissues.   Electronically Signed    By: Esperanza Heir M.D.   On: 05/01/2015 19:25     ASSESSMENT AND PLAN:  54 y.o. male with a unknown history, but based on patient's medication list its likely hypertension, hyperlipidemia, questionable cardiomyopathy who presents to the hospital with complaints of shortness of breath.  * acute on chronic respiratory failure secondary to possible volume overload  - Multifactorial and related to underlying CHF, renal failure, and liver disease.,combined with obesity hypoventilation/COPD.  -We'll start patient on IV Lasix aggressively  and intermittently to help with diuresis. -Chest x-ray suspected with asymmetric pulmonary edema. No evidence cough ongoing infection given normal white count no fever no tachycardia. DC antibiotics. -Get echo of the heart. -This was discussed with Dr. Belia Heman - Low sodium diet. Continue home Symbicort and Spiriva and treatment of other comorbidites as below -cont BIPAP for CO2 retention  **Encephalopathy, secondary to chronic Hep B cirrhosis. 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  [facility] results of Renal u/s with echogenic right kidney, no hydronephrosis, unfortunately L kidney unable to be visualized.  - Nephrology involved and recs for diuresis and avoid nephrotoxic meds/dose meds accordingly.  * morbid obesity with possible OSA -cont BIPAP  Case discussed with Care Management/Social Worker.   CODE STATUS: full  DVT Prophylaxis: heparin  TOTAL CriticalTIME TAKING CARE OF THIS PATIENT: .  >50% time spent on counselling and coordination of care Rn, Dr Rae Lips M.D on 05/02/2015 at 2:30 PM  Between 7am to 6pm - Pager - [phone]  After 6pm go to [email or web address] - password [email or web address] [facility]  [facility] [place] [facility]  Office   [phone]  CC: Primary care physician; Verlee Monte, PA-C

## 2015-05-02 NOTE — Progress Notes (Signed)
Pt arrived on unit wearing BiPap, 30%.  Pt will respond to name but is lethargic.  Pt is sinus rhythm on the monitor.  Lungs are diminished.  Foley is in place.  Oral meds were held due to pt being lethargic, risk for aspiration.  Dr. Clint Guy aware.  Pt will become agitated, bp will rise, sats will drop.  Dr. Clint Guy made aware and ordered prn hydralazine and ativan.  Ativan .5 was given due to agitation. Pt now resting comfortably.  Report given to [place], rn.

## 2015-05-02 NOTE — Progress Notes (Signed)
Dr. Belia Heman called back to discuss plan of care.  Telephone order obtained for CXR and ABG on 05/03/2015.

## 2015-05-02 NOTE — Progress Notes (Signed)
Discussed plan of care with Enedina Finner, MD. Per verbal order, discontinue Cardiology Consult and antibiotic treatment.

## 2015-05-02 NOTE — Progress Notes (Signed)
Dr. Belia Heman notifed of abnormal ABG results, and continued lethargy. No further orders received. He will follow-up with plan of care. Nurse will continue to monitor closely.

## 2015-05-02 NOTE — Progress Notes (Signed)
Spoke with Dr. Belia Heman about the latest ABG results for this pt. He gave me orders to increase the IPAP to achieve Vt 600-700, and increase the EPAP to 10. These orders were carried through.

## 2015-05-03 ENCOUNTER — Inpatient Hospital Stay: Payer: Self-pay

## 2015-05-03 LAB — BLOOD GAS, ARTERIAL
ACID-BASE DEFICIT: 2.2 mmol/L — AB (ref 0.0–2.0)
Acid-base deficit: 4.2 mmol/L — ABNORMAL HIGH (ref 0.0–2.0)
Allens test (pass/fail): POSITIVE — AB
Allens test (pass/fail): POSITIVE — AB
BICARBONATE: 23.7 meq/L (ref 21.0–28.0)
Bicarbonate: 27 mEq/L (ref 21.0–28.0)
FIO2: 0.36
FIO2: 0.3
O2 SAT: 92.5 %
O2 Saturation: 89 %
PH ART: 7.25 — AB (ref 7.350–7.450)
PO2 ART: 66 mmHg — AB (ref 83.0–108.0)
Patient temperature: 37
Patient temperature: 37
pCO2 arterial: 54 mmHg — ABNORMAL HIGH (ref 32.0–48.0)
pCO2 arterial: 66 mmHg — ABNORMAL HIGH (ref 32.0–48.0)
pH, Arterial: 7.22 — ABNORMAL LOW (ref 7.350–7.450)
pO2, Arterial: 78 mmHg — ABNORMAL LOW (ref 83.0–108.0)

## 2015-05-03 LAB — BASIC METABOLIC PANEL
Anion gap: 8 (ref 5–15)
BUN: 58 mg/dL — AB (ref 6–20)
CO2: 25 mmol/L (ref 22–32)
CREATININE: 3.7 mg/dL — AB (ref 0.61–1.24)
Calcium: 8.5 mg/dL — ABNORMAL LOW (ref 8.9–10.3)
Chloride: 109 mmol/L (ref 101–111)
GFR, EST AFRICAN AMERICAN: 20 mL/min — AB (ref >60)
GFR, EST NON AFRICAN AMERICAN: 17 mL/min — AB (ref >60)
Glucose, Bld: 102 mg/dL — ABNORMAL HIGH (ref 65–99)
POTASSIUM: 4.4 mmol/L (ref 3.5–5.1)
SODIUM: 142 mmol/L (ref 135–145)

## 2015-05-03 LAB — PROTEIN / CREATININE RATIO, URINE
Creatinine, Urine: 73 mg/dL
PROTEIN CREATININE RATIO: 0.95 mg/mg{creat} — AB (ref 0.00–0.15)
Total Protein, Urine: 69 mg/dL

## 2015-05-03 MED ORDER — HYDROCODONE-ACETAMINOPHEN 5-325 MG PO TABS
1.0000 | ORAL_TABLET | Freq: Four times a day (QID) | ORAL | Status: DC | PRN
  Administered 2015-05-03 – 2015-05-04 (×3): 1 via ORAL
  Administered 2015-05-05: 2 via ORAL
  Filled 2015-05-03 (×3): qty 1
  Filled 2015-05-03: qty 2

## 2015-05-03 MED ORDER — PANTOPRAZOLE SODIUM 40 MG PO TBEC
40.0000 mg | DELAYED_RELEASE_TABLET | Freq: Every day | ORAL | Status: DC
  Administered 2015-05-03 – 2015-05-05 (×3): 40 mg via ORAL
  Filled 2015-05-03 (×3): qty 1

## 2015-05-03 MED ORDER — HYDRALAZINE HCL 25 MG PO TABS
25.0000 mg | ORAL_TABLET | Freq: Three times a day (TID) | ORAL | Status: DC
  Administered 2015-05-03 – 2015-05-05 (×7): 25 mg via ORAL
  Filled 2015-05-03 (×7): qty 1

## 2015-05-03 MED ORDER — MOMETASONE FURO-FORMOTEROL FUM 100-5 MCG/ACT IN AERO
2.0000 | INHALATION_SPRAY | Freq: Two times a day (BID) | RESPIRATORY_TRACT | Status: DC
  Administered 2015-05-03 – 2015-05-05 (×5): 2 via RESPIRATORY_TRACT
  Filled 2015-05-03: qty 8.8

## 2015-05-03 MED ORDER — FUROSEMIDE 20 MG PO TABS
20.0000 mg | ORAL_TABLET | Freq: Two times a day (BID) | ORAL | Status: DC
  Administered 2015-05-03 – 2015-05-05 (×4): 20 mg via ORAL
  Filled 2015-05-03 (×4): qty 1

## 2015-05-03 MED ORDER — AMLODIPINE BESYLATE 10 MG PO TABS
10.0000 mg | ORAL_TABLET | Freq: Every day | ORAL | Status: DC
  Administered 2015-05-04 – 2015-05-05 (×2): 10 mg via ORAL
  Filled 2015-05-03 (×2): qty 1

## 2015-05-03 MED ORDER — SODIUM CHLORIDE 0.9 % IJ SOLN: 3.0000 mL | INTRAMUSCULAR | Status: DC | PRN

## 2015-05-03 MED ORDER — INFLUENZA VAC SPLIT QUAD 0.5 ML IM SUSY
0.5000 mL | PREFILLED_SYRINGE | INTRAMUSCULAR | Status: AC
  Administered 2015-05-04: 0.5 mL via INTRAMUSCULAR
  Filled 2015-05-03: qty 0.5

## 2015-05-03 MED ORDER — PREDNISONE 50 MG PO TABS
50.0000 mg | ORAL_TABLET | Freq: Every day | ORAL | Status: DC
  Administered 2015-05-04 – 2015-05-05 (×2): 50 mg via ORAL
  Filled 2015-05-03 (×2): qty 1

## 2015-05-03 NOTE — Consult Note (Signed)
[facility] Consultation      Name: Gary Juarez MRN: [id number] DOB: Oct 24, 1960    ADMISSION DATE:  05/01/2015  CHIEF COMPLAINT:  resp failure   HISTORY OF PRESENT ILLNESS  Patient off biPAP, alert and awake this AM, NAD. Plan to transfer to gen med floor   SIGNIFICANT EVENTS     PAST MEDICAL HISTORY    :  Past Medical History  Diagnosis Date  . Hypertension   . COPD (chronic obstructive pulmonary disease)   . Chronic kidney disease   . Chronic back pain greater than 3 months duration   . Phlebitis 1992    left calf  . Anxiety and depression    Past Surgical History  Procedure Laterality Date  . Wisdom tooth extraction     Prior to Admission medications   Medication Sig Start Date End Date Taking? Authorizing Provider  acetaminophen (TYLENOL) 325 MG tablet Take 650 mg by mouth every 6 (six) hours as needed for mild pain or headache.    Yes Historical Provider, MD  albuterol (PROVENTIL HFA;VENTOLIN HFA) 108 (90 BASE) MCG/ACT inhaler Inhale 2 puffs into the lungs every 6 (six) hours as needed for wheezing or shortness of breath.   Yes Historical Provider, MD  amLODipine (NORVASC) 5 MG tablet Take 5 mg by mouth daily.   Yes Historical Provider, MD  aspirin EC 81 MG tablet Take 81 mg by mouth daily.   Yes Historical Provider, MD  atorvastatin (LIPITOR) 20 MG tablet Take 20 mg by mouth at bedtime.    Yes Historical Provider, MD  carvedilol (COREG) 12.5 MG tablet Take 12.5 mg by mouth 2 (two) times daily.   Yes Historical Provider, MD  gabapentin (NEURONTIN) 300 MG capsule Take 300 mg by mouth 3 (three) times daily.   Yes Historical Provider, MD  pantoprazole (PROTONIX) 40 MG tablet Take 40 mg by mouth daily.   Yes Historical Provider, MD  spironolactone (ALDACTONE) 25 MG tablet Take 25 mg by mouth daily.   Yes Historical Provider, MD  tiotropium (SPIRIVA) 18 MCG inhalation capsule Place 18 mcg into inhaler and inhale daily.   Yes Historical  Provider, MD  HYDROcodone-acetaminophen (NORCO) 5-325 MG per tablet Take 1 tablet by mouth every 6 (six) hours as needed for moderate pain. Patient not taking: Reported on 05/01/2015 04/05/15   Delma Freeze, FNP   Allergies  Allergen Reactions  . Morphine And Related Other (See Comments)    Reaction:  Headaches      FAMILY HISTORY   Family History  Problem Relation Age of Onset  . Stroke Mother   . Cancer Mother     Breast      SOCIAL HISTORY    reports that he quit smoking about 4 months ago. His smoking use included Cigarettes. He has a 5 pack-year smoking history. He has never used smokeless tobacco. He reports that he does not drink alcohol or use illicit drugs.  Review of Systems  Unable to perform ROS: critical illness  Constitutional: Negative for fever, chills and weight loss.  Eyes: Negative for blurred vision.  Respiratory: Positive for shortness of breath. Negative for wheezing.   Cardiovascular: Negative for chest pain.  Gastrointestinal: Negative for nausea, vomiting and abdominal pain.  Genitourinary: Negative for flank pain.  Musculoskeletal: Negative.   Skin: Negative for rash.  Neurological: Negative for dizziness, tingling, tremors and headaches.  Endo/Heme/Allergies: Negative.   Psychiatric/Behavioral: Negative.   All other systems reviewed and are negative.  VITAL SIGNS    Temp:  [97.5 F (36.4 C)-98.9 F (37.2 C)] 97.5 F (36.4 C) (09/09 0800) Pulse Rate:  [44-64] 52 (09/09 1000) Resp:  [8-22] 12 (09/09 1000) BP: (105-183)/(61-98) 161/77 mmHg (09/09 1000) SpO2:  [92 %-100 %] 100 % (09/09 1000) FiO2 (%):  [30 %] 30 % (09/09 0800) Weight:  [347 lb 3.6 oz (157.5 kg)] 347 lb 3.6 oz (157.5 kg) (09/09 0600) HEMODYNAMICS:   VENTILATOR SETTINGS: Vent Mode:  [-]  FiO2 (%):  [30 %] 30 % INTAKE / OUTPUT:  Intake/Output Summary (Last 24 hours) at 05/03/15 1107 Last data filed at 05/03/15 0919  Gross per 24 hour  Intake      6 ml  Output    1970 ml  Net  -1964 ml       PHYSICAL EXAM   Physical Exam  Constitutional: He appears well-developed and well-nourished. No distress.  HENT:  Head: Normocephalic and atraumatic.  Eyes: Pupils are equal, round, and reactive to light. No scleral icterus.  Neck: Normal range of motion. Neck supple.  Cardiovascular: Normal rate and regular rhythm.   No murmur heard. Pulmonary/Chest: No respiratory distress. He has wheezes.  resp distress  Abdominal: Soft. He exhibits no distension. There is no tenderness.  Musculoskeletal: He exhibits no edema.  Neurological: He displays normal reflexes. Coordination normal.  lethargic  Skin: Skin is warm. No rash noted. He is not diaphoretic.       LABS   LABS:  CBC  Recent Labs Lab 05/01/15 1651 05/01/15 2212 05/02/15 0429  WBC 12.3* 11.3* 9.8  HGB 11.1* 11.2* 11.4*  HCT 34.9* 35.0* 35.4*  PLT 123* 99* 89*   Coag's No results for input(s): APTT, INR in the last 168 hours. BMET  Recent Labs Lab 05/01/15 1651 05/01/15 2212 05/02/15 0429 05/03/15 0359  NA 139  --  139 142  K 4.4  --  4.8 4.4  CL 110  --  109 109  CO2 22  --  24 25  BUN 42*  --  46* 58*  CREATININE 3.53* 3.51* 3.64* 3.70*  GLUCOSE 85  --  129* 102*   Electrolytes  Recent Labs Lab 05/01/15 1651 05/02/15 0429 05/03/15 0359  CALCIUM 8.8* 8.5* 8.5*   Sepsis Markers  Recent Labs Lab 05/01/15 1652 05/01/15 1946  LATICACIDVEN 1.0 1.0   ABG  Recent Labs Lab 05/02/15 0948 05/02/15 1525 05/03/15 0435  PHART 7.25* 7.21* 7.22*  PCO2ART 52* 60* 66*  PO2ART 59* 66* 78*   Liver Enzymes  Recent Labs Lab 05/01/15 1651  AST 19  ALT 11*  ALKPHOS 105  BILITOT 1.4*  ALBUMIN 3.4*   Cardiac Enzymes  Recent Labs Lab 05/02/15 1040 05/02/15 1604 05/02/15 2331  TROPONINI 0.03 0.03 <0.03   Glucose No results for input(s): GLUCAP in the last 168 hours.   Recent Results (from the past 240 hour(s))  MRSA PCR Screening     Status: None    Collection Time: 05/01/15  9:42 PM  Result Value Ref Range Status   MRSA by PCR NEGATIVE NEGATIVE Final    Comment:        The GeneXpert MRSA Assay (FDA approved for NASAL specimens only), is one component of a comprehensive MRSA colonization surveillance program. It is not intended to diagnose MRSA infection nor to guide or monitor treatment for MRSA infections.      Current facility-administered medications:  .  acetaminophen (TYLENOL) tablet 650 mg, 650 mg, Oral, Q6H PRN, Katharina Caper, MD,  650 mg at 05/03/15 0941 .  albuterol (PROVENTIL) (2.5 MG/3ML) 0.083% nebulizer solution 2.5 mg, 2.5 mg, Nebulization, Q2H PRN, Katharina Caper, MD .  albuterol (PROVENTIL) (2.5 MG/3ML) 0.083% nebulizer solution 2.5 mg, 2.5 mg, Nebulization, Q4H, Katharina Caper, MD, 2.5 mg at 05/03/15 0738 .  [START ON 05/04/2015] amLODipine (NORVASC) tablet 10 mg, 10 mg, Oral, Daily, Enedina Finner, MD .  antiseptic oral rinse solution (CORINZ), 7 mL, Mouth Rinse, QID, Erin Fulling, MD, 7 mL at 05/03/15 0400 .  budesonide (PULMICORT) nebulizer solution 0.5 mg, 0.5 mg, Nebulization, BID, Erin Fulling, MD, 0.5 mg at 05/03/15 0738 .  carvedilol (COREG) tablet 12.5 mg, 12.5 mg, Oral, BID, Katharina Caper, MD, 12.5 mg at 05/03/15 0907 .  chlorhexidine gluconate (PERIDEX) 0.12 % solution 15 mL, 15 mL, Mouth Rinse, BID, Erin Fulling, MD, 15 mL at 05/03/15 0800 .  enoxaparin (LOVENOX) injection 40 mg, 40 mg, Subcutaneous, Q12H, Katharina Caper, MD, 40 mg at 05/03/15 0906 .  furosemide (LASIX) tablet 20 mg, 20 mg, Oral, BID, Enedina Finner, MD .  hydrALAZINE (APRESOLINE) injection 10 mg, 10 mg, Intravenous, Q4H PRN, Wyatt Haste, MD .  hydrALAZINE (APRESOLINE) tablet 25 mg, 25 mg, Oral, 3 times per day, Enedina Finner, MD .  nitroGLYCERIN 50 mg in dextrose 5 % 250 mL (0.2 mg/mL) infusion, 0-200 mcg/min, Intravenous, Titrated, Katharina Caper, MD, Last Rate: 1.5 mL/hr at 05/01/15 2031, 5 mcg/min at 05/01/15 2031 .  [START ON 05/04/2015] predniSONE  (DELTASONE) tablet 50 mg, 50 mg, Oral, Q breakfast, Enedina Finner, MD .  sodium chloride 0.9 % injection 3 mL, 3 mL, Intravenous, Q12H, Katharina Caper, MD, 3 mL at 05/03/15 1000 .  spironolactone (ALDACTONE) tablet 25 mg, 25 mg, Oral, Daily, Katharina Caper, MD, 25 mg at 05/01/15 2218 .  tiotropium (SPIRIVA) inhalation capsule 18 mcg, 18 mcg, Inhalation, Daily, Katharina Caper, MD, 18 mcg at 05/02/15 0800  IMAGING    No results found.      ASSESSMENT/PLAN   54 yo white male with acute resp hypercapnic resp failure with acute CHF exacerbation with probaqble underlying bronchitis   PULMONARY-COPD exacerbation/chf exacerbation-much improved -biPAP as needed -oxygen as needed -changed to oral steroids -BD therapy -spiriva   CARDIOVASCULAR -restarted anti HTN meds  RENAL Follow chem 7 -follow UO  GASTROINTESTINAL -start feeding  HEMATOLOGIC -Follow h/h  INFECTIOUS -abx stopped at this time  ENDOCRINE Follow fsbs  OK to transfer to gen med floor   I have personally obtained a history, examined the patient, evaluated laboratory and independently reviewed  imaging results, formulated the assessment and plan and placed orders.  The Patient requires high complexity decision making for assessment and support, frequent evaluation and titration of therapies, application of advanced monitoring technologies and extensive interpretation of multiple databases. Critical Care Time devoted to patient care services described in this note is 25  minutes.     Lucie Leather, M.D.  [facility]  Medical Director [facility] Medical Director [facility] Cardio-Pulmonary Department

## 2015-05-03 NOTE — Progress Notes (Signed)
Patient transferred from ICU, oriented to room, fall, and safety contract reviewed. Focused assessment as charted. No complaints at this time. Skin verified with Shana Chute, RN. Telemetry box verified. Trudee Kuster

## 2015-05-03 NOTE — Progress Notes (Signed)
[facility] - [place] at [facility]   PATIENT NAME: Gary Juarez    MR#:  [id number]  DATE OF BIRTH:  12-30-1960  SUBJECTIVE:  Came in with increasing sob and lethargy. On bipap. Opens eyes momentarily and falls back to sleep. No fever  REVIEW OF SYSTEMS:   Review of Systems  Constitutional: Negative for fever, chills and weight loss.  HENT: Negative for ear discharge, ear pain and nosebleeds.   Eyes: Negative for blurred vision, pain and discharge.  Respiratory: Positive for shortness of breath. Negative for sputum production, wheezing and stridor.   Cardiovascular: Negative for chest pain, palpitations, orthopnea and PND.  Gastrointestinal: Negative for nausea, vomiting, abdominal pain and diarrhea.  Genitourinary: Negative for urgency and frequency.  Musculoskeletal: Negative for back pain and joint pain.  Neurological: Negative for sensory change, speech change, focal weakness and weakness.  Psychiatric/Behavioral: Negative for depression. The patient is not nervous/anxious.   All other systems reviewed and are negative.  Tolerating Diet:yes Tolerating PT: not done yet  DRUG ALLERGIES:   Allergies  Allergen Reactions  . Morphine And Related Other (See Comments)    Reaction:  Headaches     VITALS:  Blood pressure 127/57, pulse 61, temperature 98.5 F (36.9 C), temperature source Axillary, resp. rate 16, height 5\' 11"  (1.803 m), weight 157.5 kg (347 lb 3.6 oz), SpO2 96 %.  PHYSICAL EXAMINATION:   Physical Exam  GENERAL:  54 y.o.-year-old patient lying in the bed with no acute distress. Obese, disheleved EYES: Pupils equal, round, reactive to light and accommodation. No scleral icterus. Extraocular muscles intact.  HEENT: Head atraumatic, normocephalic. Oropharynx and nasopharynx clear. BIPAP prn NECK:  Supple, no jugular venous distention. No thyroid enlargement, no tenderness.  LUNGS: distant breath sounds bilaterally, no wheezing, rales,  rhonchi. No use of accessory muscles of respiration.  CARDIOVASCULAR: S1, S2 normal. No murmurs, rubs, or gallops.  ABDOMEN: Soft, nontender, nondistended. Bowel sounds present. No organomegaly or mass. obesity EXTREMITIES: +++ edema b/l.    NEUROLOGIC: no focal deficits PSYCHIATRIC: alert and oriented x3 SKIN: No obvious rash, lesion, or ulcer.    LABORATORY PANEL:   CBC  Recent Labs Lab 05/02/15 0429  WBC 9.8  HGB 11.4*  HCT 35.4*  PLT 89*    Chemistries   Recent Labs Lab 05/01/15 1651  05/03/15 0359  NA 139  < > 142  K 4.4  < > 4.4  CL 110  < > 109  CO2 22  < > 25  GLUCOSE 85  < > 102*  BUN 42*  < > 58*  CREATININE 3.53*  < > 3.70*  CALCIUM 8.8*  < > 8.5*  AST 19  --   --   ALT 11*  --   --   ALKPHOS 105  --   --   BILITOT 1.4*  --   --   < > = values in this interval not displayed.  Cardiac Enzymes  Recent Labs Lab 05/02/15 2331  TROPONINI <0.03    RADIOLOGY:  Ct Head Wo Contrast  05/01/2015   CLINICAL DATA:  Patient with shortness of breath. Pinpoint pupils. Altered mental status.  EXAM: CT HEAD WITHOUT CONTRAST  TECHNIQUE: Contiguous axial images were obtained from the base of the skull through the vertex without intravenous contrast.  COMPARISON:  Brain CT 03/09/2011  FINDINGS: Limited evaluation secondary to technique. Within the right parietal lobe (image 21; series 4) there is a focal area of low-attenuation. 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 No evidence for acute intracranial hemorrhage, mass lesion or mass-effect. Orbits are unremarkable. Leftward deviation of the nasal septum. Mastoid air cells are unremarkable. Paranasal sinuses are unremarkable.  IMPRESSION: Focal area of low attenuation within the right parietal lobe which is indeterminate in etiology (possibly volume-averaging through sulci or potentially age-indeterminate infarct). Consider further evaluation if the patient is presenting with localized symptomatology.  No acute  intracranial hemorrhage.   Electronically Signed   By: Annia Belt M.D.   On: 05/01/2015 17:31   US Renal  05/03/2015   CLINICAL DATA:  Acute renal failure.  EXAM: RENAL / URINARY TRACT ULTRASOUND COMPLETE  COMPARISON:  11/16/2014 CT  FINDINGS: Right Kidney:  Length: 7.2 cm. Echogenic renal parenchyma. No focal mass or hydronephrosis.  Left Kidney:  Length: Estimated to be 10.0 cm. Parenchyma is echogenic. There is very poor visualization of the left kidney. Focal masses would be difficult to exclude. No obvious hydronephrosis identified.  Bladder:  A Foley catheter decompresses the bladder.  Additional:  Study is technically compromised by patient body habitus and limited mobility.  IMPRESSION: 1. Echogenic kidneys bilaterally. Findings are consistent with intrinsic renal disease. 2. Very limited evaluation of the left kidney given the patient's body habitus and limited mobility. 3. No evidence for hydronephrosis.   Electronically Signed   By: Norva Pavlov M.D.   On: 05/03/2015 12:36   Dg Chest Port 1 View  05/01/2015   CLINICAL DATA:  Difficulty breathing  EXAM: PORTABLE CHEST - 1 VIEW  COMPARISON:  11/25/2014  FINDINGS: Severe cardiac enlargement stable. Moderate vascular congestion similar to prior study. Prominent pulmonary arteries. Left lung base not well evaluated. Possible air something ring superimposition of overlying soft tissues.  IMPRESSION: Similar to prior study 0 significant cardiac enlargement with vascular congestion. There may be pulmonary edema.  Asymmetric opacity over the right lung base may represent a combination of infiltrate or asymmetric edema +superimposed soft tissues.   Electronically Signed   By: Esperanza Heir M.D.   On: 05/01/2015 19:25     ASSESSMENT AND PLAN:  54 y.o. male with a unknown history, but based on patient's medication list its likely hypertension, hyperlipidemia, questionable cardiomyopathy who presents to the hospital with complaints of shortness of  breath.  * acute on chronic respiratory failure secondary to possible volume overload  - Multifactorial and related to underlying CHF, renal failure, and liver disease.,combined with obesity hypoventilation/COPD.  -recieved IV Lasix aggressively  to help with diuresis. -change to po lasix since creat up and diuresed well and now back on 2 liter [place] -Chest x-ray suspected with asymmetric pulmonary edema. No evidence cough ongoing infection given normal white count no fever no tachycardia. DC antibiotics. -ECHO pending - discussed with Dr. Belia Heman - Low sodium diet. Continue home Symbicort and Spiriva and treatment of other comorbidites as below -cont BIPAP for CO2 retention  **Encephalopathy, secondary to chronic Hep B cirrhosis,. Co2 narcosis  resolved  Hypoxemia/hypercarbia contributes as well Will continue lactulose for 3-5 BM daily once able to tke po  **h/o CAD  cont aspirin 81 mg po daily, Coreg 12.5 bib and atorva 20 daily.   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 --->3.70 -[facility] results of Renal u/s with echogenic right kidney, no hydronephrosis, unfortunately L kidney unable to be visualized.  - Nephrology involved and recs for diuresis and avoid nephrotoxic meds/dose meds accordingly.  * morbid obesity with possible OSA -cont BIPAP  ** CM for  d/c planning Transfer to 2 A Case discussed with Care Management/Social Worker.   CODE STATUS: full  DVT Prophylaxis: heparin  TOTAL TIME TAKING CARE OF THIS PATIENT: .  >50% time spent on counselling and coordination of care Rn, Dr Rae Lips M.D on 05/03/2015 at 1:39 PM  Between 7am to 6pm - Pager - [phone]  After 6pm go to [email or web address] - password [email or web address] [facility]  [place] [facility]  Office  [phone]  CC: Primary care physician; Verlee Monte, PA-C

## 2015-05-03 NOTE — Progress Notes (Signed)
Patient alert and he is uncomfortable with the BiPAP achieving tidal volume between 800-1017mL On BiPAP set at 22 with EPAP of 10, oxygenating well, will lower IPAP as well as EPAP keeping the same relative delta Pressure. Discussed with respiratory patient is greatly improved after BiPAP changes with tidal volume still in the 6-700 range

## 2015-05-03 NOTE — Clinical Documentation Improvement (Signed)
Hospitalist  Can the diagnosis of CKD be further specified?   CKD Stage I - GFR greater than or equal to 90  CKD Stage II - GFR 60-89  CKD Stage III - GFR 30-59  CKD Stage IV - GFR 15-29  CKD Stage V - GFR < 15  ESRD (End Stage Renal Disease)  Other condition  Unable to clinically determine   Supporting Information: : (risk factors, signs and symptoms, diagnostics, treatment) Acute on chronic kidney disease per 9/08 progress notes.  Labs:  Bun   Creat    GFR: 9/09:    58      3.70      17 9/08:    46      3.64      17 9/07:    42      3.53      18  Please exercise your independent, professional judgment when responding. A specific answer is not anticipated or expected.   Thank Sabino Donovan Health Information Management [place] [phone]

## 2015-05-03 NOTE — Progress Notes (Signed)
Report given to Grover Canavan, RN on 2A Telemetry unit. Will call for transport when patient returns from ultrasound.

## 2015-05-03 NOTE — Evaluation (Signed)
Physical Therapy Evaluation Patient Details Name: Gary Juarez MRN: [id number] DOB: Sep 14, 1960 Today's Date: 05/03/2015   History of Present Illness  Pt is a 54 y.o. male presenting to hospital with AMS and SOB.  Pt admitted with acute respiratory failure with hypoxia and hypercapnia and CHF exacerbation and went to ICU with Bipap (pt now transferred to floor and on nasal cannula).  PMH includes htn, COPD, CKD, and chronic back pain.  Clinical Impression  Currently pt demonstrates impairments with activity tolerance, pain (chronic back pain and L lateral ankle pain with ambulation), balance, and limitations with functional mobility.  Prior to admission, pt was independent (occasionally used SPC) except had friends drive him for groceries and errands; pt walked maximum 1 city block d/t SOB and fatigue at baseline.  Pt lives alone in 1 level home with 4 steps to enter no railing.  Currently pt is supervision with bed mobility, CGA with transfers, and CGA x2 for gait (45 feet) with bari-RW (2 assist with longer ambulation distance for safety during PT eval but pt steady without loss of balance).  Pt would benefit from skilled PT to address above noted impairments and functional limitations.  Recommend pt discharge to home (with HHPT and bari-RW pending pt's progress) when medically appropriate.     Follow Up Recommendations Home health PT (pending pt's progress)    Equipment Recommendations   (Bari-RW pending pt's progress)    Recommendations for Other Services       Precautions / Restrictions Precautions Precautions: Fall Restrictions Weight Bearing Restrictions: No      Mobility  Bed Mobility Overal bed mobility: Needs Assistance Bed Mobility: Supine to Sit;Sit to Supine     Supine to sit: Supervision;HOB elevated Sit to supine: Supervision;HOB elevated      Transfers Overall transfer level: Needs assistance Equipment used: Rolling walker (2 wheeled) (Bari-RW) Transfers:  Sit to/from Stand Sit to Stand: Min guard         General transfer comment: no loss of balance noted with standing  Ambulation/Gait Ambulation/Gait assistance: Min guard;+2 safety/equipment (2nd assist for safety with longer distance) Ambulation Distance (Feet): 45 Feet Assistive device: Rolling walker (2 wheeled)   Gait velocity: decreased   General Gait Details: decreased B step length/foot clearance/heelstrike; pt with 3 short standing rest breaks (pt leaning on RW 1x d/t reporting pain in low back-chronic); some SOB noted but O2 >95% on 3 L/min via nasal cannula during/after ambulation  Stairs            Wheelchair Mobility    Modified Rankin (Stroke Patients Only)       Balance Overall balance assessment: Needs assistance Sitting-balance support: Bilateral upper extremity supported;Feet supported Sitting balance-Leahy Scale: Good     Standing balance support: Bilateral upper extremity supported (on RW) Standing balance-Leahy Scale: Good                               Pertinent Vitals/Pain Pain Assessment: 0-10 Pain Score: 8  (8/10 back pain; 4/10 lateral L ankle pain) Pain Location: back and lateral L ankle Pain Descriptors / Indicators:  (constant/chronic back pain; L lateral ankle pain with ambulation) Pain Intervention(s): Limited activity within patient's tolerance;Monitored during session;Repositioned;Patient requesting pain meds-RN notified  Vitals stable and WFL throughout treatment session on 3 L/min via nasal cannula (see flowsheet for HR and O2 vitals).    Home Living Family/patient expects to be discharged to:: Private residence Living Arrangements: Alone  Available Help at Discharge: Friend(s)   Home Access: Stairs to enter Entrance Stairs-Rails: None Entrance Stairs-Number of Steps: 4 Home Layout: One level Home Equipment: Cane - single point      Prior Function Level of Independence: Needs assistance   Gait / Transfers  Assistance Needed: Independent (occasionally uses SPC); pt ambulates maximum 1 city block baseline  ADL's / Homemaking Assistance Needed: Independent except has friends assist with driving to get groceries/do errands (pt does not have driver's license)  Comments: Pt unemployed for past 5 years     Hand Dominance        Extremity/Trunk Assessment   Upper Extremity Assessment: Generalized weakness           Lower Extremity Assessment: Generalized weakness         Communication   Communication: No difficulties  Cognition Arousal/Alertness: Awake/alert Behavior During Therapy: WFL for tasks assessed/performed Overall Cognitive Status: Impaired/Different from baseline Area of Impairment: Orientation Orientation Level: Time (pt reported it was 2017 (did not know month))                  General Comments General comments (skin integrity, edema, etc.): skin abrasions noted L lower leg  Nursing cleared pt for participation in physical therapy.  Pt agreeable to PT session.    Exercises        Assessment/Plan    PT Assessment Patient needs continued PT services  PT Diagnosis Generalized weakness;Difficulty walking   PT Problem List Decreased strength;Decreased activity tolerance;Decreased balance;Decreased mobility;Pain  PT Treatment Interventions DME instruction;Gait training;Stair training;Functional mobility training;Therapeutic activities;Therapeutic exercise;Balance training;Patient/family education   PT Goals (Current goals can be found in the Care Plan section) Acute Rehab PT Goals Patient Stated Goal: To be able to walk more PT Goal Formulation: With patient Time For Goal Achievement: 05/19/15 Potential to Achieve Goals: Good    Frequency Min 2X/week   Barriers to discharge        Co-evaluation               End of Session Equipment Utilized During Treatment: Gait belt;Oxygen Activity Tolerance: Patient limited by fatigue;Patient limited by  pain (chronic back pain) Patient left: in bed;with call bell/phone within reach;with bed alarm set Nurse Communication: Mobility status;Patient requests pain meds;Precautions         Time: 1610-9604 PT Time Calculation (min) (ACUTE ONLY): 27 min   Charges:   PT Evaluation $Initial PT Evaluation Tier I: 1 Procedure     PT G CodesHendricks Limes 05/05/2015, 4:52 PM Hendricks Limes, PT [phone]

## 2015-05-03 NOTE — Clinical Documentation Improvement (Signed)
Hospitalist  Can the diagnosis of CHF be further specified?    Acuity - Acute, Chronic, Acute on Chronic   Type - Diastolic, Systolic  Other  Clinically Undetermined   Document any associated diagnoses/conditions  Supporting Information: Acute on chronic respiratory failure secondary to possible volume overload - Multifactorial and related to underlying CHF, renal failure, and liver disease.,combined with obesity hypoventilation/COPD. -We'll start patient on IV Lasix aggressively and intermittently to help with diuresis per 9/09 progress notes.  9/07: BNP: 1607.0.  Please exercise your independent, professional judgment when responding. A specific answer is not anticipated or expected.   Thank Sabino Donovan Health Information Management [place] [phone]

## 2015-05-03 NOTE — Progress Notes (Signed)
Patient was made a followup appointment at the [facility] on May 13, 2015 at 11:00am. Thank you.

## 2015-05-03 NOTE — Progress Notes (Signed)
[facility]  ROUNDING NOTE   Subjective:   Admitted on 9/7 for altered mental status. Found to be in diastolic congestive heart failure and acute exacerbation of COPD with respiratory acidosis.  Acute renal failure with creatinine on admission of 3.5 from baseline of 2.5 3/16  Patient given furosemide 40mg  bid and spironolactone. UOP of 2235  Blood pressure still elevated.   Patient has been taken off BiPap this morning.   Objective:  Vital signs in last 24 hours:  Temp:  [98.4 F (36.9 C)-98.9 F (37.2 C)] 98.9 F (37.2 C) (09/08 1600) Pulse Rate:  [44-64] 52 (09/09 0800) Resp:  [8-22] 12 (09/09 0800) BP: (142-176)/(61-98) 176/95 mmHg (09/09 0800) SpO2:  [92 %-100 %] 98 % (09/09 0800) FiO2 (%):  [30 %] 30 % (09/09 0800) Weight:  [157.5 kg (347 lb 3.6 oz)] 157.5 kg (347 lb 3.6 oz) (09/09 0600)  Weight change: 10.081 kg (22 lb 3.6 oz) Filed Weights   05/01/15 2100 05/02/15 0539 05/03/15 0600  Weight: 162.3 kg (357 lb 12.9 oz) 161.4 kg (355 lb 13.2 oz) 157.5 kg (347 lb 3.6 oz)    Intake/Output: I/O last 3 completed shifts: In: 303 [I.V.:3; IV Piggyback:300] Out: 3210 [Urine:3210]   Intake/Output this shift:  Total I/O In: -  Out: 525 [Urine:525]  Physical Exam: General: Critically ill  Head: Normocephalic, atraumatic. Moist oral mucosal membranes  Eyes: Anicteric, PERRL  Neck: Supple, trachea midline  Lungs:  Bilateral rales  Heart: Regular rate and rhythm  Abdomen:  Soft, nontender, obese  Extremities: trace peripheral edema.  Neurologic: Nonfocal, moving all four extremities  Skin: No lesions  GU foley    Basic Metabolic Panel:  Recent Labs Lab 05/01/15 1651 05/01/15 2212 05/02/15 0429 05/03/15 0359  NA 139  --  139 142  K 4.4  --  4.8 4.4  CL 110  --  109 109  CO2 22  --  24 25  GLUCOSE 85  --  129* 102*  BUN 42*  --  46* 58*  CREATININE 3.53* 3.51* 3.64* 3.70*  CALCIUM 8.8*  --  8.5* 8.5*    Liver Function Tests:  Recent  Labs Lab 05/01/15 1651  AST 19  ALT 11*  ALKPHOS 105  BILITOT 1.4*  PROT 6.9  ALBUMIN 3.4*   No results for input(s): LIPASE, AMYLASE in the last 168 hours. No results for input(s): AMMONIA in the last 168 hours.  CBC:  Recent Labs Lab 05/01/15 1651 05/01/15 2212 05/02/15 0429  WBC 12.3* 11.3* 9.8  NEUTROABS 9.1*  --   --   HGB 11.1* 11.2* 11.4*  HCT 34.9* 35.0* 35.4*  MCV 84.6 85.3 85.5  PLT 123* 99* 89*    Cardiac Enzymes:  Recent Labs Lab 05/01/15 1651 05/02/15 1040 05/02/15 1604 05/02/15 2331  CKMB  --  3.0  --   --   TROPONINI 0.06* 0.03 0.03 <0.03    BNP: Invalid input(s): POCBNP  CBG: No results for input(s): GLUCAP in the last 168 hours.  Microbiology: Results for orders placed or performed during the hospital encounter of 05/01/15  MRSA PCR Screening     Status: None   Collection Time: 05/01/15  9:42 PM  Result Value Ref Range Status   MRSA by PCR NEGATIVE NEGATIVE Final    Comment:        The GeneXpert MRSA Assay (FDA approved for NASAL specimens only), is one component of a comprehensive MRSA colonization surveillance program. It is not intended to diagnose  MRSA infection nor to guide or monitor treatment for MRSA infections.     Coagulation Studies: No results for input(s): LABPROT, INR in the last 72 hours.  Urinalysis:  Recent Labs  05/01/15 2056  COLORURINE YELLOW*  LABSPEC 1.016  PHURINE 5.0  GLUCOSEU NEGATIVE  HGBUR 1+*  BILIRUBINUR NEGATIVE  KETONESUR NEGATIVE  PROTEINUR >500*  NITRITE NEGATIVE  LEUKOCYTESUR NEGATIVE      Imaging: Ct Head Wo Contrast  05/01/2015   CLINICAL DATA:  Patient with shortness of breath. Pinpoint pupils. Altered mental status.  EXAM: CT HEAD WITHOUT CONTRAST  TECHNIQUE: Contiguous axial images were obtained from the base of the skull through the vertex without intravenous contrast.  COMPARISON:  Brain CT 03/09/2011  FINDINGS: Limited evaluation secondary to technique. Within the right  parietal lobe (image 21; series 4) there is a focal area of low-attenuation. 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 No evidence for acute intracranial hemorrhage, mass lesion or mass-effect. Orbits are unremarkable. Leftward deviation of the nasal septum. Mastoid air cells are unremarkable. Paranasal sinuses are unremarkable.  IMPRESSION: Focal area of low attenuation within the right parietal lobe which is indeterminate in etiology (possibly volume-averaging through sulci or potentially age-indeterminate infarct). Consider further evaluation if the patient is presenting with localized symptomatology.  No acute intracranial hemorrhage.   Electronically Signed   By: Annia Belt M.D.   On: 05/01/2015 17:31   Dg Chest Port 1 View  05/01/2015   CLINICAL DATA:  Difficulty breathing  EXAM: PORTABLE CHEST - 1 VIEW  COMPARISON:  11/25/2014  FINDINGS: Severe cardiac enlargement stable. Moderate vascular congestion similar to prior study. Prominent pulmonary arteries. Left lung base not well evaluated. Possible air something ring superimposition of overlying soft tissues.  IMPRESSION: Similar to prior study 0 significant cardiac enlargement with vascular congestion. There may be pulmonary edema.  Asymmetric opacity over the right lung base may represent a combination of infiltrate or asymmetric edema +superimposed soft tissues.   Electronically Signed   By: Esperanza Heir M.D.   On: 05/01/2015 19:25     Medications:   . nitroGLYCERIN 5 mcg/min (05/01/15 2031)  . propofol (DIPRIVAN) infusion     . albuterol  2.5 mg Nebulization Q4H  . amLODipine  5 mg Oral Daily  . antiseptic oral rinse  7 mL Mouth Rinse QID  . budesonide (PULMICORT) nebulizer solution  0.5 mg Nebulization BID  . carvedilol  12.5 mg Oral BID  . chlorhexidine gluconate  15 mL Mouth Rinse BID  . enoxaparin (LOVENOX) injection  40 mg Subcutaneous Q12H  . furosemide  40 mg Intravenous BID  . methylPREDNISolone (SOLU-MEDROL) injection   60 mg Intravenous Q24H  . pantoprazole (PROTONIX) IV  40 mg Intravenous Q24H  . sodium chloride  3 mL Intravenous Q12H  . spironolactone  25 mg Oral Daily  . tiotropium  18 mcg Inhalation Daily   acetaminophen, albuterol, hydrALAZINE  Assessment/ Plan:  [name] is a 54 y.o.  white male with hepatits B, hypertension, COPD, anemia, thrombocytopenia, morbid obesity, tobacco abuse.  1. Acute Renal Failure on chronic kidney disease stage IV with proteinuria: acute renal failure from acute exacerbation of diastolic congestive heart failure. Concern for progression of disease. Creatinine baseline of 2.57 in 12/12/2014. (2.7 on 5/4 at [facility]).  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 However does have proteinuria on most recent urinalysis. Has followed with Dr. Thedore Mins since 2012.  - Continue diuretics: furosemide and spironolactone -  Continue to monitor volume status, urine output and renal function - Renally dose all medications - Not currently on an ACE-I/ARB. Not prescribed as an outpatient either.  - No acute indication for dialysis, will monitor daily.  - Check renal ultrasound, SPEP/UPEP, spot urine protein to creatinine ratio.   2. Hypertension: not well controlled.  - Currently on carvedilol, amlodipine, furosemide, and spironolactone.  - not on an ACE-I/ARB: would hold at this time - Recommend increasing amlodipine to 10mg  daily and monitor for peripheral edema.   3. Respiratory Acidosis: secondary to acute exacerbation of COPD. Placed on BiPap. Now breathing nonlabored with nasal canula.   4. 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 Anemia seems long standing as well. SPEP/UPEP as above.  - consider iron studies.     LOS: 2 Climmie Buelow 9/9/20169:00 AM

## 2015-05-04 LAB — RENAL FUNCTION PANEL
ANION GAP: 8 (ref 5–15)
Albumin: 3.2 g/dL — ABNORMAL LOW (ref 3.5–5.0)
BUN: 70 mg/dL — ABNORMAL HIGH (ref 6–20)
CHLORIDE: 104 mmol/L (ref 101–111)
CO2: 25 mmol/L (ref 22–32)
CREATININE: 3.6 mg/dL — AB (ref 0.61–1.24)
Calcium: 8.4 mg/dL — ABNORMAL LOW (ref 8.9–10.3)
GFR calc non Af Amer: 18 mL/min — ABNORMAL LOW (ref >60)
GFR, EST AFRICAN AMERICAN: 20 mL/min — AB (ref >60)
Glucose, Bld: 105 mg/dL — ABNORMAL HIGH (ref 65–99)
Phosphorus: 4 mg/dL (ref 2.5–4.6)
Potassium: 3.9 mmol/L (ref 3.5–5.1)
Sodium: 137 mmol/L (ref 135–145)

## 2015-05-04 LAB — AMMONIA: Ammonia: 73 umol/L — ABNORMAL HIGH (ref 9–35)

## 2015-05-04 MED ORDER — LACTULOSE 10 GM/15ML PO SOLN
30.0000 g | Freq: Two times a day (BID) | ORAL | Status: DC
  Administered 2015-05-04: 30 g via ORAL
  Filled 2015-05-04 (×2): qty 60

## 2015-05-04 MED ORDER — HYDROMORPHONE HCL 1 MG/ML IJ SOLN
2.0000 mg | Freq: Once | INTRAMUSCULAR | Status: AC
  Administered 2015-05-04: 2 mg via INTRAVENOUS
  Filled 2015-05-04: qty 2

## 2015-05-04 NOTE — Progress Notes (Signed)
[facility]  ROUNDING NOTE   Subjective:   Patient walking around room. States he did not sleep well. Complains of fatigue. States he is making plenty of urine.  Currently breathing room air.   Objective:  Vital signs in last 24 hours:  Temp:  [97.6 F (36.4 C)-98.9 F (37.2 C)] 97.6 F (36.4 C) (09/10 0437) Pulse Rate:  [50-61] 55 (09/10 0738) Resp:  [16-22] 20 (09/10 0738) BP: (123-186)/(54-162) 184/87 mmHg (09/10 0738) SpO2:  [93 %-100 %] 100 % (09/10 0738) FiO2 (%):  [32 %] 32 % (09/09 1630) Weight:  [160.846 kg (354 lb 9.6 oz)] 160.846 kg (354 lb 9.6 oz) (09/10 0437)  Weight change: 3.346 kg (7 lb 6 oz) Filed Weights   05/02/15 0539 05/03/15 0600 05/04/15 0437  Weight: 161.4 kg (355 lb 13.2 oz) 157.5 kg (347 lb 3.6 oz) 160.846 kg (354 lb 9.6 oz)    Intake/Output: I/O last 3 completed shifts: In: 246 [P.O.:240; I.V.:6] Out: 2275 [Urine:2275]   Intake/Output this shift:  Total I/O In: 240 [P.O.:240] Out: 400 [Urine:400]  Physical Exam: General: NAD  Head: Normocephalic, atraumatic. Moist oral mucosal membranes  Eyes: Anicteric, PERRL  Neck: Supple, trachea midline  Lungs:  Bilateral rales  Heart: Regular rate and rhythm  Abdomen:  Soft, nontender, obese  Extremities: No peripheral edema.  Neurologic: Nonfocal, moving all four extremities  Skin: No lesions  GU foley    Basic Metabolic Panel:  Recent Labs Lab 05/01/15 1651 05/01/15 2212 05/02/15 0429 05/03/15 0359 05/04/15 0730  NA 139  --  139 142 137  K 4.4  --  4.8 4.4 3.9  CL 110  --  109 109 104  CO2 22  --  GLUCOSE 85  --  129* 102* 105*  BUN 42*  --  46* 58* 70*  CREATININE 3.53* 3.51* 3.64* 3.70* 3.60*  CALCIUM 8.8*  --  8.5* 8.5* 8.4*  PHOS  --   --   --   --  4.0    Liver Function Tests:  Recent Labs Lab 05/01/15 1651 05/04/15 0730  AST 19  --   ALT 11*  --   ALKPHOS 105  --   BILITOT 1.4*  --   PROT 6.9  --   ALBUMIN 3.4* 3.2*   No results for  input(s): LIPASE, AMYLASE in the last 168 hours. No results for input(s): AMMONIA in the last 168 hours.  CBC:  Recent Labs Lab 05/01/15 1651 05/01/15 2212 05/02/15 0429  WBC 12.3* 11.3* 9.8  NEUTROABS 9.1*  --   --   HGB 11.1* 11.2* 11.4*  HCT 34.9* 35.0* 35.4*  MCV 84.6 85.3 85.5  PLT 123* 99* 89*    Cardiac Enzymes:  Recent Labs Lab 05/01/15 1651 05/02/15 1040 05/02/15 1604 05/02/15 2331  CKMB  --  3.0  --   --   TROPONINI 0.06* 0.03 0.03 <0.03    BNP: Invalid input(s): POCBNP  CBG: No results for input(s): GLUCAP in the last 168 hours.  Microbiology: Results for orders placed or performed during the hospital encounter of 05/01/15  MRSA PCR Screening     Status: None   Collection Time: 05/01/15  9:42 PM  Result Value Ref Range Status   MRSA by PCR NEGATIVE NEGATIVE Final    Comment:        The GeneXpert MRSA Assay (FDA approved for NASAL specimens only), is one component of a comprehensive MRSA colonization surveillance program. It is not intended  to diagnose MRSA infection nor to guide or monitor treatment for MRSA infections.     Coagulation Studies: No results for input(s): LABPROT, INR in the last 72 hours.  Urinalysis:  Recent Labs  05/01/15 2056  COLORURINE YELLOW*  LABSPEC 1.016  PHURINE 5.0  GLUCOSEU NEGATIVE  HGBUR 1+*  BILIRUBINUR NEGATIVE  KETONESUR NEGATIVE  PROTEINUR >500*  NITRITE NEGATIVE  LEUKOCYTESUR NEGATIVE      Imaging: US Renal  05/03/2015   CLINICAL DATA:  Acute renal failure.  EXAM: RENAL / URINARY TRACT ULTRASOUND COMPLETE  COMPARISON:  11/16/2014 CT  FINDINGS: Right Kidney:  Length: 7.2 cm. Echogenic renal parenchyma. No focal mass or hydronephrosis.  Left Kidney:  Length: Estimated to be 10.0 cm. Parenchyma is echogenic. There is very poor visualization of the left kidney. Focal masses would be difficult to exclude. No obvious hydronephrosis identified.  Bladder:  A Foley catheter decompresses the bladder.   Additional:  Study is technically compromised by patient body habitus and limited mobility.  IMPRESSION: 1. Echogenic kidneys bilaterally. Findings are consistent with intrinsic renal disease. 2. Very limited evaluation of the left kidney given the patient's body habitus and limited mobility. 3. No evidence for hydronephrosis.   Electronically Signed   By: Norva Pavlov M.D.   On: 05/03/2015 12:36     Medications:     . albuterol  2.5 mg Nebulization Q4H  . amLODipine  10 mg Oral Daily  . carvedilol  12.5 mg Oral BID  . enoxaparin (LOVENOX) injection  40 mg Subcutaneous Q12H  . furosemide  20 mg Oral BID  . hydrALAZINE  25 mg Oral 3 times per day  . Influenza vac split quadrivalent PF  0.5 mL Intramuscular Tomorrow-1000  . mometasone-formoterol  2 puff Inhalation BID  . pantoprazole  40 mg Oral Daily  . predniSONE  50 mg Oral Q breakfast  . spironolactone  25 mg Oral Daily  . tiotropium  18 mcg Inhalation Daily   acetaminophen, albuterol, hydrALAZINE, HYDROcodone-acetaminophen, sodium chloride  Assessment/ Plan:  Mr. BENNEY SOMMERVILLE is a 54 y.o.  white male with hepatits B, hypertension, COPD, anemia, thrombocytopenia, morbid obesity, tobacco abuse.  1. Acute Renal Failure on chronic kidney disease stage IV with proteinuria: acute renal failure from acute exacerbation of diastolic congestive heart failure. Concern for progression of disease. Creatinine baseline of 2.57 in 12/12/2014. (2.7 on 5/4 at [facility]).  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 However does have proteinuria on most recent urinalysis. Has followed with Dr. Thedore Mins since 2012.  - Continue diuretics: furosemide and spironolactone - Continue to monitor volume status, urine output and renal function - Renally dose all medications - Not currently on an ACE-I/ARB. Not prescribed as an outpatient either.  - No acute indication for dialysis, will monitor daily.  - pending  urine studies. Renal ultrasound reviewed with patient.   2. Hypertension: not well controlled.  - Currently on carvedilol, amlodipine, hydralazine furosemide, and spironolactone.  - increased amlodipine to 10mg  daily.  - not on an ACE-I/ARB: would hold at this time   3. Respiratory Acidosis: secondary to acute exacerbation of COPD.   4. 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 Anemia seems long standing as well. SPEP/UPEP pending - consider iron studies.     LOS: 3 Finian Helvey 9/10/201610:25 AM

## 2015-05-04 NOTE — Progress Notes (Signed)
[facility] - [place] at [facility]   PATIENT NAME: Gary Juarez    MR#:  [id number]  DATE OF BIRTH:  05/15/1961  SUBJECTIVE:   -Breathing has improved significantly. Off BiPAP. Currently on nasal cannula only. -Renal function is worse than baseline, but is stable -Complains of very severe back pain, not relieved with Norco.  REVIEW OF SYSTEMS:   Review of Systems  Constitutional: Negative for fever, chills and weight loss.  HENT: Negative for ear discharge, ear pain and nosebleeds.   Eyes: Negative for blurred vision, pain and discharge.  Respiratory: Positive for shortness of breath. Negative for sputum production, wheezing and stridor.   Cardiovascular: Negative for chest pain, palpitations, orthopnea and PND.  Gastrointestinal: Negative for nausea, vomiting, abdominal pain and diarrhea.  Genitourinary: Negative for urgency and frequency.  Musculoskeletal: Positive for back pain. Negative for joint pain.  Neurological: Negative for dizziness, sensory change, speech change, focal weakness and weakness.  Psychiatric/Behavioral: Negative for depression. The patient is not nervous/anxious.   All other systems reviewed and are negative.  Tolerating Diet:yes   DRUG ALLERGIES:   Allergies  Allergen Reactions  . Morphine And Related Other (See Comments)    Reaction:  Headaches     VITALS:  Blood pressure 159/72, pulse 51, temperature 98 F (36.7 C), temperature source Oral, resp. rate 16, height  (1.803 m), weight 160.846 kg (354 lb 9.6 oz), SpO2 98 %.  PHYSICAL EXAMINATION:   Physical Exam  GENERAL:  54 y.o.-year-old obese patient lying in the bed with no acute distress. Obese, disheleved EYES: Pupils equal, round, reactive to light and accommodation. No scleral icterus. Extraocular muscles intact.  HEENT: Head atraumatic, normocephalic. Oropharynx and nasopharynx clear.  NECK:  Supple, no jugular venous distention. No thyroid enlargement,  no tenderness.  LUNGS: distant breath sounds bilaterally, no wheezing, rales, rhonchi. No use of accessory muscles of respiration. Decreased bibasilar breath sounds. CARDIOVASCULAR: S1, S2 normal. No murmurs, rubs, or gallops.  ABDOMEN: Soft, nontender, nondistended. Bowel sounds present. No organomegaly or mass. obesity EXTREMITIES: 2+ edema b/l.  Mild erythema of left lower extremity with old trauma related skin bruises noted.  NEUROLOGIC: no focal deficits, normal sensation and motor function. Cranial nerves are intact. PSYCHIATRIC: alert and oriented x3 SKIN: No obvious rash, lesion, or ulcer.    LABORATORY PANEL:   CBC  Recent Labs Lab 05/02/15 0429  WBC 9.8  HGB 11.4*  HCT 35.4*  PLT 89*    Chemistries   Recent Labs Lab 05/01/15 1651  05/04/15 0730  NA 139  < > 137  K 4.4  < > 3.9  CL 110  < > 104  CO2 22  < > 25  GLUCOSE 85  < > 105*  BUN 42*  < > 70*  CREATININE 3.53*  < > 3.60*  CALCIUM 8.8*  < > 8.4*  AST 19  --   --   ALT 11*  --   --   ALKPHOS 105  --   --   BILITOT 1.4*  --   --   < > = values in this interval not displayed.  Cardiac Enzymes  Recent Labs Lab 05/02/15 2331  TROPONINI <0.03    RADIOLOGY:  US Renal  05/03/2015   CLINICAL DATA:  Acute renal failure.  EXAM: RENAL / URINARY TRACT ULTRASOUND COMPLETE  COMPARISON:  11/16/2014 CT  FINDINGS: Right Kidney:  Length: 7.2 cm. Echogenic renal parenchyma. No focal mass or hydronephrosis.  Left Kidney:  Length: Estimated to be 10.0 cm. Parenchyma is echogenic. There is very poor visualization of the left kidney. Focal masses would be difficult to exclude. No obvious hydronephrosis identified.  Bladder:  A Foley catheter decompresses the bladder.  Additional:  Study is technically compromised by patient body habitus and limited mobility.  IMPRESSION: 1. Echogenic kidneys bilaterally. Findings are consistent with intrinsic renal disease. 2. Very limited evaluation of the left kidney given the patient's  body habitus and limited mobility. 3. No evidence for hydronephrosis.   Electronically Signed   By: Norva Pavlov M.D.   On: 05/03/2015 12:36     ASSESSMENT AND PLAN:  54 y.o. male with a unknown history, but based on patient's medication list its likely hypertension, hyperlipidemia, questionable cardiomyopathy who presents to the hospital with complaints of shortness of breath.  * Acute on chronic respiratory failure secondary to possible volume overload  - Multifactorial and related to underlying CHF, renal failure, and liver disease.,combined with obesity hypoventilation/COPD.  -recieved IV Lasix aggressively  to help with diuresis. -Anti-on by mouth Lasix. Off BiPAP and on 2 L nasal cannula. -Chest x-ray suspected with asymmetric pulmonary edema.  -ECHO pending - Low sodium diet. Continue home Symbicort and Spiriva and treatment of other comorbidites as below  * Low back pain Daily on chronic pain. Likely the bed exacerbating his pain. -When necessary pain medications ordered.   * metabolic Encephalopathy, on admission. Secondary to Co2 narcosis  resolved  Hypoxemia/hypercarbia resolved with BiPAP. -continue lactulose for 3-5 BM daily due to history of cirrhosis -Ammonia level not done on admission. Will get a level today  *h/o CAD - no active chest pain. - cont aspirin 81 mg po daily, Coreg 12.5 twice a day and statin daily.   *Acute on Chronic CKD-IV:  Baseline, around 2.57 -Cr 3.53 on admission. And is stable around 3.6 [facility] results of Renal u/s with echogenic right kidney, no hydronephrosis, unfortunately L kidney unable to be visualized.  - Nephrology consult is appreciated -Likely progression of his CK D stage IV. -Will need strict outpatient follow-up. Continue to monitor urine output. No acute indication for dialysis at this time.  * Morbid obesity with possible OSA -Was on BiPAP in the hospital. Will need sleep study as an outpatient. - Says the CPAP machine hurts  his back and also neck.   Case discussed with Care Management/Social Worker.   CODE STATUS: full  DVT Prophylaxis: heparin  TOTAL TIME TAKING CARE OF THIS PATIENT: 36 minutes.  Possible discharge home tomorrow if stable.    Charma Mocarski M.D on 05/04/2015 at 12:34 PM  Between 7am to 6pm - Pager - [phone]  After 6pm go to [email or web address] - password [email or web address] [facility]  [place]  [facility]  Office  [phone]  CC: Primary care physician; Verlee Monte, PA-C

## 2015-05-04 NOTE — Clinical Social Work Note (Signed)
CSW received consult due to transportation issues. CSW met with patient to discuss this and patient stated that the consult was an error because he does not have difficulty with transportation. He states he has friends that assist him with transportation. Patient states he is able to obtain his basic necessities but did mention he has oxygen at home that he needs tubing for. RN CM notified. Shela Leff MSW,LCSW [phone]

## 2015-05-04 NOTE — Care Management Note (Signed)
Case Management Note  Patient Details  Name: Gary Juarez MRN: [id number] Date of Birth: [birth year]  Subjective/Objective:    [name] reports that he is having difficulty at home obtaining oxygen tubing from [facility] who supply his oxygen. An e-mail was sent to [facility] at [facility] requesting that someone from [facility] address this issue with [name].                 Action/Plan:   Expected Discharge Date:                  Expected Discharge Plan:     In-House Referral:     Discharge planning Services     Post Acute Care Choice:    Choice offered to:     DME Arranged:    DME Agency:     HH Arranged:    HH Agency:     Status of Service:     Medicare Important Message Given:    Date Medicare IM Given:    Medicare IM give by:    Date Additional Medicare IM Given:    Additional Medicare Important Message give by:     If discussed at Long Length of Stay Meetings, dates discussed:    Additional Comments:  Kerrie Timm A, RN 05/04/2015, 2:17 PM

## 2015-05-04 NOTE — Progress Notes (Signed)
Gary Juarez. Room air. Ambulated in hallway and dropped to 87% RA. Takes meds ok. Complained of pain and got diluadid. EF 60-65%. A & O. Pt has no further concerns at this time.

## 2015-05-05 DIAGNOSIS — I5030 Unspecified diastolic (congestive) heart failure: Secondary | ICD-10-CM

## 2015-05-05 LAB — RENAL FUNCTION PANEL
Albumin: 3.1 g/dL — ABNORMAL LOW (ref 3.5–5.0)
Anion gap: 9 (ref 5–15)
BUN: 70 mg/dL — AB (ref 6–20)
CALCIUM: 8.4 mg/dL — AB (ref 8.9–10.3)
CHLORIDE: 103 mmol/L (ref 101–111)
CO2: 24 mmol/L (ref 22–32)
CREATININE: 3.68 mg/dL — AB (ref 0.61–1.24)
GFR calc non Af Amer: 17 mL/min — ABNORMAL LOW (ref >60)
GFR, EST AFRICAN AMERICAN: 20 mL/min — AB (ref >60)
Glucose, Bld: 140 mg/dL — ABNORMAL HIGH (ref 65–99)
Phosphorus: 4.5 mg/dL (ref 2.5–4.6)
Potassium: 3.8 mmol/L (ref 3.5–5.1)
SODIUM: 136 mmol/L (ref 135–145)

## 2015-05-05 LAB — AMMONIA: Ammonia: 53 umol/L — ABNORMAL HIGH (ref 9–35)

## 2015-05-05 MED ORDER — HYDROCODONE-ACETAMINOPHEN 5-325 MG PO TABS: 1.0000 | ORAL_TABLET | Freq: Four times a day (QID) | ORAL | Status: AC | PRN

## 2015-05-05 MED ORDER — HYDRALAZINE HCL 25 MG PO TABS: 25.0000 mg | ORAL_TABLET | Freq: Three times a day (TID) | ORAL | Status: AC

## 2015-05-05 MED ORDER — FUROSEMIDE 20 MG PO TABS: 20.0000 mg | ORAL_TABLET | Freq: Two times a day (BID) | ORAL | Status: DC

## 2015-05-05 MED ORDER — RIFAXIMIN 550 MG PO TABS
550.0000 mg | ORAL_TABLET | Freq: Two times a day (BID) | ORAL | Status: DC
  Administered 2015-05-05: 550 mg via ORAL
  Filled 2015-05-05 (×2): qty 1

## 2015-05-05 MED ORDER — AMLODIPINE BESYLATE 10 MG PO TABS: 10.0000 mg | ORAL_TABLET | Freq: Every day | ORAL | Status: AC

## 2015-05-05 MED ORDER — FLUTICASONE-SALMETEROL 250-50 MCG/DOSE IN AEPB
1.0000 | INHALATION_SPRAY | Freq: Two times a day (BID) | RESPIRATORY_TRACT | Status: AC
Start: 2015-05-05 — End: ?

## 2015-05-05 MED ORDER — PREDNISONE 10 MG (21) PO TBPK
10.0000 mg | ORAL_TABLET | Freq: Every day | ORAL | Status: AC
Start: 2015-05-05 — End: ?

## 2015-05-05 MED ORDER — LACTULOSE 10 GM/15ML PO SOLN: 30.0000 g | Freq: Two times a day (BID) | ORAL | Status: AC

## 2015-05-05 MED ORDER — LACTULOSE 10 GM/15ML PO SOLN
30.0000 g | Freq: Three times a day (TID) | ORAL | Status: DC
  Filled 2015-05-05: qty 60

## 2015-05-05 NOTE — Progress Notes (Signed)
Pt's father arrived on unit about 1600, pt stated he had received a call from [facility] in his room and they would deliver his O2 tank to the room at 20:00.  Pt stated he was leaving, he did not want to wait that long for a tank, he reported to this RN that he does not use O2 at home all the time, only as needed, O2 sats up in room 90%, called CM to let her know [facility] did not need to deliver O2, pt said he lives 15 minutes away and would be fine, instructed him to keep AC in car turned up to high.  Pt verbalized understanding and stated he was going home.  Wheeled down in wheelchair, [facility] turned to high, did not appear to be in any distress, Left in car with his father

## 2015-05-05 NOTE — Discharge Instructions (Signed)
1. Heart Failure Clinic appointment on May 13, 2015 at 11:00am with Clarisa Kindred, FNP. Please call [phone] to reschedule. 2. Home oxygen 2l continuous 3. Home health

## 2015-05-05 NOTE — Progress Notes (Signed)
Pt very reluctant to leave, became somewhat irritable this afternoon, stated his father can't bring his O2 tank because "he can't get in my house."  Reminded pt he had stayed an extra night last night and had been discharged since this morning, but this RN let him sleep several hours to be sure he was rested before he left.  Pt stated he "didn't really sleep at all."  But pt was observed sleeping soundly by staff and did not rouse when we checked on him.  [facility] will bring a portable tank for the patient he will then leave the hospital with his father.  Patient given his DC instructions and his IV sites DCd.  Patient verbalized understanding.

## 2015-05-05 NOTE — Discharge Summary (Signed)
[facility] - [place] at [facility]   PATIENT NAME: Gary Juarez    MR#:  [id number]  DATE OF BIRTH:  May 05, 1961  DATE OF ADMISSION:  05/01/2015 ADMITTING PHYSICIAN: Katharina Caper, MD  DATE OF DISCHARGE: 05/05/2015  PRIMARY CARE PHYSICIAN: Verlee Monte, PA-C    ADMISSION DIAGNOSIS:  Stupor [R40.1] Acute on chronic renal failure [N17.9, N18.9] Respiratory failure with hypoxia and hypercapnia [J96.00] Bilateral low back pain without sciatica [M54.5]  DISCHARGE DIAGNOSIS:  Principal Problem:   Diastolic CHF Active Problems:   Acute respiratory failure with hypercapnia   COPD exacerbation   SECONDARY DIAGNOSIS:   Past Medical History  Diagnosis Date  . Hypertension   . COPD (chronic obstructive pulmonary disease)   . Chronic kidney disease   . Chronic back pain greater than 3 months duration   . Phlebitis 1992    left calf  . Anxiety and depression     HOSPITAL COURSE:   54 y.o. male with a unknown history, but based on patient's medication list its likely hypertension, hyperlipidemia, questionable cardiomyopathy who presents to the hospital with complaints of shortness of breath.  * Acute on chronic respiratory failure secondary to possible volume overload  - Multifactorial and related to underlying CHF, renal failure, and liver disease.,combined with obesity hypoventilation/COPD.  - Acute on chronic diastolic CHF exacerbation -Lasix changed to orally twice a day, continue 2 L oxygen support at home. Patient is off of BiPAP -ECHO with diastolic dysfunction and normal ejection fraction. LVH present - Low sodium diet. Continue home Advair and Spiriva and treatment  * Low back pain - chronic pain. Likely the bed exacerbating his pain. -Discharge on Norco.   * metabolic Encephalopathy, on admission. Secondary to Co2 narcosis resolved Hypoxemia/hypercarbia resolved with BiPAP. -continue lactulose for 3-5 BM daily due to history of  cirrhosis -Ammonia level elevated yesterday and improved today with lactulose and xifaxan  *h/o CAD - no active chest pain. - cont aspirin 81 mg po daily, Coreg 12.5 twice a day and statin daily.   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 And is stable around 3.6 [facility] results of Renal u/s with echogenic right kidney, no hydronephrosis, unfortunately L kidney unable to be visualized.  - Nephrology consult is appreciated -Likely progression of his CKD stage IV. -Will need strict outpatient follow-up. Continue to monitor urine output. No acute indication for dialysis at this time.  * Morbid obesity with possible OSA -Was on BiPAP in the hospital. Will need sleep study as an outpatient. - Says the CPAP machine hurts his back and also neck.  Clinically stable for discharge. Will need strict outpatient follow-up. -Care management to help with medications. -Home health will be arranged  DISCHARGE CONDITIONS:  Guarded  CONSULTS OBTAINED:  Treatment Team:  Lamont Dowdy, MD  DRUG ALLERGIES:   Allergies  Allergen Reactions  . Morphine And Related Other (See Comments)    Reaction:  Headaches     DISCHARGE MEDICATIONS:   Current Discharge Medication List    START taking these medications   Details  Fluticasone-Salmeterol (ADVAIR DISKUS) 250-50 MCG/DOSE AEPB Inhale 1 puff into the lungs 2 (two) times daily. Qty: 60 each, Refills: 2    furosemide (LASIX) 20 MG tablet Take 1 tablet (20 mg total) by mouth 2 (two) times daily. Qty: 60 tablet, Refills: 2    hydrALAZINE (APRESOLINE) 25 MG tablet Take 1 tablet (25 mg total) by mouth every 8 (eight) hours. Qty: 90 tablet, Refills:  2    lactulose (CHRONULAC) 10 GM/15ML solution Take 45 mLs (30 g total) by mouth 2 (two) times daily. Qty: 240 mL, Refills: 1    predniSONE (STERAPRED UNI-PAK 21 TAB) 10 MG (21) TBPK tablet Take 1 tablet (10 mg total) by mouth daily. 6 tabs PO x 1 day 5 tabs PO x 1 day 4  tabs PO x 1 day 3 tabs PO x 1 day 2 tabs PO x 1 day 1 tab PO x 1 day and stop Qty: 21 tablet, Refills: 0      CONTINUE these medications which have CHANGED   Details  amLODipine (NORVASC) 10 MG tablet Take 1 tablet (10 mg total) by mouth daily. Qty: 30 tablet, Refills: 2    HYDROcodone-acetaminophen (NORCO/VICODIN) 5-325 MG per tablet Take 1 tablet by mouth every 6 (six) hours as needed for moderate pain. Qty: 25 tablet, Refills: 0      CONTINUE these medications which have NOT CHANGED   Details  acetaminophen (TYLENOL) 325 MG tablet Take 650 mg by mouth every 6 (six) hours as needed for mild pain or headache.     albuterol (PROVENTIL HFA;VENTOLIN HFA) 108 (90 BASE) MCG/ACT inhaler Inhale 2 puffs into the lungs every 6 (six) hours as needed for wheezing or shortness of breath.    aspirin EC 81 MG tablet Take 81 mg by mouth daily.    atorvastatin (LIPITOR) 20 MG tablet Take 20 mg by mouth at bedtime.     carvedilol (COREG) 12.5 MG tablet Take 12.5 mg by mouth 2 (two) times daily.    gabapentin (NEURONTIN) 300 MG capsule Take 300 mg by mouth 3 (three) times daily.    pantoprazole (PROTONIX) 40 MG tablet Take 40 mg by mouth daily.    tiotropium (SPIRIVA) 18 MCG inhalation capsule Place 18 mcg into inhaler and inhale daily.      STOP taking these medications     spironolactone (ALDACTONE) 25 MG tablet          DISCHARGE INSTRUCTIONS:   1. PCP follow-up in 1-2 weeks 2. Nephrology follow-up in 1-2 weeks 3. Home health  If you experience worsening of your admission symptoms, develop shortness of breath, life threatening emergency, suicidal or homicidal thoughts you must seek medical attention immediately by calling 911 or calling your MD immediately  if symptoms less severe.  You Must read complete instructions/literature along with all the possible adverse reactions/side effects for all the Medicines you take and that have been prescribed to you. Take any new Medicines  after you have completely understood and accept all the possible adverse reactions/side effects.   Please note  You were cared for by a hospitalist during your hospital stay. If you have any questions about your discharge medications or the care you received while you were in the hospital after you are discharged, you can call the unit and asked to speak with the hospitalist on call if the hospitalist that took care of you is not available. Once you are discharged, your primary care physician will handle any further medical issues. Please note that NO REFILLS for any discharge medications will be authorized once you are discharged, as it is imperative that you return to your primary care physician (or establish a relationship with a primary care physician if you do not have one) for your aftercare needs so that they can reassess your need for medications and monitor your lab values.    Today   CHIEF COMPLAINT:   Chief Complaint  Patient presents with  . Altered Mental Status    VITAL SIGNS:  Blood pressure 130/71, pulse 53, temperature 97.9 F (36.6 C), temperature source Oral, resp. rate 18, height 5\' 11"  (1.803 m), weight 158.668 kg (349 lb 12.8 oz), SpO2 96 %.  I/O:   Intake/Output Summary (Last 24 hours) at 05/05/15 1128 Last data filed at 05/05/15 0900  Gross per 24 hour  Intake    600 ml  Output    300 ml  Net    300 ml    PHYSICAL EXAMINATION:   Physical Exam  GENERAL: 54 y.o.-year-old obese patient lying in the bed with no acute distress. Obese, disheleved EYES: Pupils equal, round, reactive to light and accommodation. No scleral icterus. Extraocular muscles intact.  HEENT: Head atraumatic, normocephalic. Oropharynx and nasopharynx clear.  NECK: Supple, no jugular venous distention. No thyroid enlargement, no tenderness.  LUNGS: distant breath sounds bilaterally, no wheezing, rales, rhonchi. No use of accessory muscles of respiration. Decreased bibasilar breath  sounds. CARDIOVASCULAR: S1, S2 normal. No murmurs, rubs, or gallops.  ABDOMEN: Soft, nontender, nondistended. Bowel sounds present. No organomegaly or mass. obesity EXTREMITIES: 1+ edema b/l, slightly worse on the left side which is chronic for the patient. Mild erythema of left lower extremity with old trauma related skin bruises noted.  NEUROLOGIC: no focal deficits, normal sensation and motor function. Cranial nerves are intact. PSYCHIATRIC: alert and oriented x3 SKIN: No obvious rash, lesion, or ulcer.   DATA REVIEW:   CBC  Recent Labs Lab 05/02/15 0429  WBC 9.8  HGB 11.4*  HCT 35.4*  PLT 89*    Chemistries   Recent Labs Lab 05/01/15 1651  05/05/15 0502  NA 139  < > 136  K 4.4  < > 3.8  CL 110  < > 103  CO2 22  < > 24  GLUCOSE 85  < > 140*  BUN 42*  < > 70*  CREATININE 3.53*  < > 3.68*  CALCIUM 8.8*  < > 8.4*  AST 19  --   --   ALT 11*  --   --   ALKPHOS 105  --   --   BILITOT 1.4*  --   --   < > = values in this interval not displayed.  Cardiac Enzymes  Recent Labs Lab 05/02/15 2331  TROPONINI <0.03    Microbiology Results  Results for orders placed or performed during the hospital encounter of 05/01/15  MRSA PCR Screening     Status: None   Collection Time: 05/01/15  9:42 PM  Result Value Ref Range Status   MRSA by PCR NEGATIVE NEGATIVE Final    Comment:        The GeneXpert MRSA Assay (FDA approved for NASAL specimens only), is one component of a comprehensive MRSA colonization surveillance program. It is not intended to diagnose MRSA infection nor to guide or monitor treatment for MRSA infections.     RADIOLOGY:  US Renal  05/03/2015   CLINICAL DATA:  Acute renal failure.  EXAM: RENAL / URINARY TRACT ULTRASOUND COMPLETE  COMPARISON:  11/16/2014 CT  FINDINGS: Right Kidney:  Length: 7.2 cm. Echogenic renal parenchyma. No focal mass or hydronephrosis.  Left Kidney:  Length: Estimated to be 10.0 cm. Parenchyma is echogenic. There is very  poor visualization of the left kidney. Focal masses would be difficult to exclude. No obvious hydronephrosis identified.  Bladder:  A Foley catheter decompresses the bladder.  Additional:  Study is technically compromised by patient body  habitus and limited mobility.  IMPRESSION: 1. Echogenic kidneys bilaterally. Findings are consistent with intrinsic renal disease. 2. Very limited evaluation of the left kidney given the patient's body habitus and limited mobility. 3. No evidence for hydronephrosis.   Electronically Signed   By: Norva Pavlov M.D.   On: 05/03/2015 12:36    EKG:   Orders placed or performed during the hospital encounter of 05/01/15  . EKG 12-Lead  . EKG 12-Lead  . ED EKG  . ED EKG      Management plans discussed with the patient, family and they are in agreement.  CODE STATUS:     Code Status Orders        Start     Ordered   05/01/15 2134  Full code   Continuous     05/01/15 2133      TOTAL TIME TAKING CARE OF THIS PATIENT: 37 minutes.    Enid Baas M.D on 05/05/2015 at 11:28 AM  Between 7am to 6pm - Pager - [phone]  After 6pm go to [email or web address] - password [email or web address] [facility]  [facility]  Office  [phone]  CC: Primary care physician; Verlee Monte, PA-C

## 2015-05-05 NOTE — Progress Notes (Signed)
[facility]  ROUNDING NOTE   Subjective:   Creatinine and BUN are about the same.  UOP 800.  Patient complains of back pain. Continues to drink more than fluid restriction.   Objective:  Vital signs in last 24 hours:  Temp:  [97.4 F (36.3 C)-98 F (36.7 C)] 97.4 F (36.3 C) (09/11 0533) Pulse Rate:  [51-90] 90 (09/11 0533) Resp:  [16-20] 20 (09/11 0533) BP: (148-159)/(61-77) 159/75 mmHg (09/11 0533) SpO2:  [87 %-100 %] 100 % (09/11 0724) Weight:  [158.668 kg (349 lb 12.8 oz)] 158.668 kg (349 lb 12.8 oz) (09/11 0610)  Weight change: -2.177 kg (-4 lb 12.8 oz) Filed Weights   05/03/15 0600 05/04/15 0437 05/05/15 0610  Weight: 157.5 kg (347 lb 3.6 oz) 160.846 kg (354 lb 9.6 oz) 158.668 kg (349 lb 12.8 oz)    Intake/Output: I/O last 3 completed shifts: In: 480 [P.O.:480] Out: 1400 [Urine:1400]   Intake/Output this shift:  Total I/O In: 360 [P.O.:360] Out: -   Physical Exam: General: NAD  Head: Normocephalic, atraumatic. Moist oral mucosal membranes  Eyes: Anicteric, PERRL  Neck: Supple, trachea midline  Lungs:  Bilateral rales  Heart: bradycardia  Abdomen:  Soft, nontender, obese  Extremities: No peripheral edema.  Neurologic: Nonfocal, moving all four extremities  Skin: No lesions       Basic Metabolic Panel:  Recent Labs Lab 05/01/15 1651 05/01/15 2212 05/02/15 0429 05/03/15 0359 05/04/15 0730 05/05/15 0502  NA 139  --  139 142 137 136  K 4.4  --  4.8 4.4 3.9 3.8  CL 110  --  109 109 104 103  CO2 22  --  GLUCOSE 85  --  129* 102* 105* 140*  BUN 42*  --  46* 58* 70* 70*  CREATININE 3.53* 3.51* 3.64* 3.70* 3.60* 3.68*  CALCIUM 8.8*  --  8.5* 8.5* 8.4* 8.4*  PHOS  --   --   --   --  4.0 4.5    Liver Function Tests:  Recent Labs Lab 05/01/15 1651 05/04/15 0730 05/05/15 0502  AST 19  --   --   ALT 11*  --   --   ALKPHOS 105  --   --   BILITOT 1.4*  --   --   PROT 6.9  --   --   ALBUMIN 3.4* 3.2* 3.1*   No  results for input(s): LIPASE, AMYLASE in the last 168 hours.  Recent Labs Lab 05/04/15 1310 05/05/15 0717  AMMONIA 73* 53*    CBC:  Recent Labs Lab 05/01/15 1651 05/01/15 2212 05/02/15 0429  WBC 12.3* 11.3* 9.8  NEUTROABS 9.1*  --   --   HGB 11.1* 11.2* 11.4*  HCT 34.9* 35.0* 35.4*  MCV 84.6 85.3 85.5  PLT 123* 99* 89*    Cardiac Enzymes:  Recent Labs Lab 05/01/15 1651 05/02/15 1040 05/02/15 1604 05/02/15 2331  CKMB  --  3.0  --   --   TROPONINI 0.06* 0.03 0.03 <0.03    BNP: Invalid input(s): POCBNP  CBG: No results for input(s): GLUCAP in the last 168 hours.  Microbiology: Results for orders placed or performed during the hospital encounter of 05/01/15  MRSA PCR Screening     Status: None   Collection Time: 05/01/15  9:42 PM  Result Value Ref Range Status   MRSA by PCR NEGATIVE NEGATIVE Final    Comment:        The GeneXpert MRSA Assay (FDA approved  for NASAL specimens only), is one component of a comprehensive MRSA colonization surveillance program. It is not intended to diagnose MRSA infection nor to guide or monitor treatment for MRSA infections.     Coagulation Studies: No results for input(s): LABPROT, INR in the last 72 hours.  Urinalysis: No results for input(s): COLORURINE, LABSPEC, PHURINE, GLUCOSEU, HGBUR, BILIRUBINUR, KETONESUR, PROTEINUR, UROBILINOGEN, NITRITE, LEUKOCYTESUR in the last 72 hours.  Invalid input(s): APPERANCEUR    Imaging: US Renal  05/03/2015   CLINICAL DATA:  Acute renal failure.  EXAM: RENAL / URINARY TRACT ULTRASOUND COMPLETE  COMPARISON:  11/16/2014 CT  FINDINGS: Right Kidney:  Length: 7.2 cm. Echogenic renal parenchyma. No focal mass or hydronephrosis.  Left Kidney:  Length: Estimated to be 10.0 cm. Parenchyma is echogenic. There is very poor visualization of the left kidney. Focal masses would be difficult to exclude. No obvious hydronephrosis identified.  Bladder:  A Foley catheter decompresses the bladder.   Additional:  Study is technically compromised by patient body habitus and limited mobility.  IMPRESSION: 1. Echogenic kidneys bilaterally. Findings are consistent with intrinsic renal disease. 2. Very limited evaluation of the left kidney given the patient's body habitus and limited mobility. 3. No evidence for hydronephrosis.   Electronically Signed   By: Norva Pavlov M.D.   On: 05/03/2015 12:36     Medications:     . albuterol  2.5 mg Nebulization Q4H  . amLODipine  10 mg Oral Daily  . carvedilol  12.5 mg Oral BID  . enoxaparin (LOVENOX) injection  40 mg Subcutaneous Q12H  . furosemide  20 mg Oral BID  . hydrALAZINE  25 mg Oral 3 times per day  . lactulose  30 g Oral TID  . mometasone-formoterol  2 puff Inhalation BID  . pantoprazole  40 mg Oral Daily  . predniSONE  50 mg Oral Q breakfast  . rifaximin  550 mg Oral BID  . tiotropium  18 mcg Inhalation Daily   acetaminophen, albuterol, hydrALAZINE, HYDROcodone-acetaminophen, sodium chloride  Assessment/ Plan:  Gary Juarez is a 54 y.o.  white male with hepatits B, hypertension, COPD, anemia, thrombocytopenia, morbid obesity, tobacco abuse.  1. Acute Renal Failure on chronic kidney disease stage IV with proteinuria: acute renal failure from acute exacerbation of diastolic congestive heart failure. Concern for progression of disease. Creatinine baseline of 2.57 in 12/12/2014. (2.7 on 5/4 at [facility]).  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 Last seen in our office by Dr. Thedore Mins in 2012.  - Continue diuretics: furosemide and spironolactone - Continue to monitor volume status, urine output and renal function - Renally dose all medications - Not currently on an ACE-I/ARB. Not prescribed as an outpatient either.  - No acute indication for dialysis, will monitor daily.   2. Hypertension: not well controlled.  - Currently on carvedilol, amlodipine, hydralazine furosemide, and  spironolactone.  - increased amlodipine to 10mg  daily.  - not on an ACE-I/ARB: would hold at this time   3. Respiratory Acidosis: secondary to acute exacerbation of COPD. Improved.   4. 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 Anemia seems long standing as well. Stable.    LOS: 4 Gary Juarez 9/11/201610:41 AM

## 2015-05-05 NOTE — Care Management Note (Signed)
Case Management Note  Patient Details  Name: DENNYS GUIN MRN: [id number] Date of Birth: [birth year]  Subjective/Objective:    Uninsured Mr Mottola was provided with a generic medication discount card, applications to [facility]. A Financial Eligibility Application was completed and faxed to [facility] with a request for home health PT and RN services. Mr Faro already has home oxygen supplied by [facility]. Mr Bond already sees a PA for medical care.                Action/Plan:   Expected Discharge Date:                  Expected Discharge Plan:     In-House Referral:     Discharge planning Services     Post Acute Care Choice:    Choice offered to:     DME Arranged:    DME Agency:     HH Arranged:    HH Agency:     Status of Service:     Medicare Important Message Given:    Date Medicare IM Given:    Medicare IM give by:    Date Additional Medicare IM Given:    Additional Medicare Important Message give by:     If discussed at Long Length of Stay Meetings, dates discussed:    Additional Comments:  Acasia Skilton A, RN 05/05/2015, 12:36 PM

## 2015-05-05 NOTE — Plan of Care (Signed)
Problem: Phase I Progression Outcomes Goal: Progress activity as tolerated unless otherwise ordered Outcome: Completed/Met Date Met:  05/05/15 Pt has ambulated in the hall

## 2015-05-07 LAB — PROTEIN ELECTROPHORESIS, SERUM
A/G Ratio: 0.9 (ref 0.7–1.7)
Albumin ELP: 2.9 g/dL (ref 2.9–4.4)
Alpha-1-Globulin: 0.2 g/dL (ref 0.0–0.4)
Alpha-2-Globulin: 0.6 g/dL (ref 0.4–1.0)
Beta Globulin: 0.8 g/dL (ref 0.7–1.3)
GLOBULIN, TOTAL: 3.1 g/dL (ref 2.2–3.9)
Gamma Globulin: 1.4 g/dL (ref 0.4–1.8)
TOTAL PROTEIN ELP: 6 g/dL (ref 6.0–8.5)

## 2015-05-13 ENCOUNTER — Encounter: Payer: Self-pay | Admitting: Family

## 2015-05-13 ENCOUNTER — Ambulatory Visit: Payer: Self-pay | Attending: Family | Admitting: Family

## 2015-05-13 VITALS — BP 160/82 | HR 55 | Resp 20 | Ht 71.0 in | Wt 364.0 lb

## 2015-05-13 DIAGNOSIS — R079 Chest pain, unspecified: Secondary | ICD-10-CM | POA: Insufficient documentation

## 2015-05-13 DIAGNOSIS — J449 Chronic obstructive pulmonary disease, unspecified: Secondary | ICD-10-CM | POA: Insufficient documentation

## 2015-05-13 DIAGNOSIS — Z87891 Personal history of nicotine dependence: Secondary | ICD-10-CM | POA: Insufficient documentation

## 2015-05-13 DIAGNOSIS — M549 Dorsalgia, unspecified: Secondary | ICD-10-CM | POA: Insufficient documentation

## 2015-05-13 DIAGNOSIS — I5033 Acute on chronic diastolic (congestive) heart failure: Secondary | ICD-10-CM | POA: Insufficient documentation

## 2015-05-13 DIAGNOSIS — I129 Hypertensive chronic kidney disease with stage 1 through stage 4 chronic kidney disease, or unspecified chronic kidney disease: Secondary | ICD-10-CM | POA: Insufficient documentation

## 2015-05-13 DIAGNOSIS — F329 Major depressive disorder, single episode, unspecified: Secondary | ICD-10-CM | POA: Insufficient documentation

## 2015-05-13 DIAGNOSIS — N189 Chronic kidney disease, unspecified: Secondary | ICD-10-CM | POA: Insufficient documentation

## 2015-05-13 DIAGNOSIS — N289 Disorder of kidney and ureter, unspecified: Secondary | ICD-10-CM | POA: Insufficient documentation

## 2015-05-13 DIAGNOSIS — Z79899 Other long term (current) drug therapy: Secondary | ICD-10-CM | POA: Insufficient documentation

## 2015-05-13 DIAGNOSIS — Z7982 Long term (current) use of aspirin: Secondary | ICD-10-CM | POA: Insufficient documentation

## 2015-05-13 DIAGNOSIS — G8929 Other chronic pain: Secondary | ICD-10-CM | POA: Insufficient documentation

## 2015-05-13 DIAGNOSIS — I5032 Chronic diastolic (congestive) heart failure: Secondary | ICD-10-CM

## 2015-05-13 MED ORDER — NITROGLYCERIN 0.4 MG SL SUBL: 0.4000 mg | SUBLINGUAL_TABLET | SUBLINGUAL | Status: AC | PRN

## 2015-05-13 MED ORDER — POTASSIUM CHLORIDE CRYS ER 20 MEQ PO TBCR: 20.0000 meq | EXTENDED_RELEASE_TABLET | Freq: Two times a day (BID) | ORAL | Status: AC

## 2015-05-13 MED ORDER — TORSEMIDE 20 MG PO TABS: 40.0000 mg | ORAL_TABLET | Freq: Two times a day (BID) | ORAL | Status: AC

## 2015-05-13 NOTE — Patient Instructions (Addendum)
Stop furosemide and change to torsemide  twice daily along with potassium daily.   Monitor sodium and fluid intake closely.  Followup with nephrologist (kidney doctor) on 05/15/15 at 9:20am and cardiologist (heart doctor) today at 1:00pm.

## 2015-05-13 NOTE — Progress Notes (Signed)
Subjective:    Patient ID: Gary Juarez, male    DOB: 1960/11/12, 54 y.o.   MRN: [id number]  Chest Pain  This is a recurrent problem. The current episode started in the past 7 days (last 2 days). The onset quality is sudden. The problem occurs daily. The problem has been unchanged. The pain is present in the substernal region. The pain is at a severity of 5/10. The pain is mild. The quality of the pain is described as dull. The pain does not radiate. Associated symptoms include abdominal pain, back pain (chronic in nature), dizziness, lower extremity edema, malaise/fatigue, nausea, shortness of breath and weakness. Pertinent negatives include no cough, headaches, numbness or palpitations. The pain is aggravated by nothing. He has tried nothing for the symptoms. The treatment provided no relief. Risk factors include lack of exercise, male gender, obesity and sedentary lifestyle.  His past medical history is significant for CHF, HTN and hypertension.  Pertinent negatives for past medical history include no DM and no DVT.  Congestive Heart Failure Presents for follow-up visit. The disease course has been worsening. Associated symptoms include abdominal pain, chest pain (last 2 days), edema, fatigue, orthopnea, shortness of breath and unexpected weight change. Pertinent negatives include no chest pressure or palpitations. The symptoms have been worsening. Past treatments include beta blockers and salt and fluid restriction. The treatment provided mild relief. His past medical history is significant for chronic lung disease and HTN. There is no history of DM or DVT.  Hypertension This is a chronic problem. The current episode started more than 1 year ago. The problem is unchanged. Associated symptoms include chest pain (last 2 days), malaise/fatigue, peripheral edema and shortness of breath. Pertinent negatives include no headaches, neck pain or palpitations. There are no associated agents to  hypertension. Risk factors for coronary artery disease include male gender, obesity and sedentary lifestyle. Past treatments include beta blockers and diuretics. The current treatment provides mild improvement. Compliance problems include medication cost.  Hypertensive end-organ damage includes kidney disease and heart failure. There is no history of CVA.    Past Medical History  Diagnosis Date  . Hypertension   . COPD (chronic obstructive pulmonary disease)   . Chronic kidney disease   . Chronic back pain greater than 3 months duration   . Phlebitis 1992    left calf  . Anxiety and depression     Past Surgical History  Procedure Laterality Date  . Wisdom tooth extraction      Family History  Problem Relation Age of Onset  . Stroke Mother   . Cancer Mother     Breast    Social History  Substance Use Topics  . Smoking status: Former Smoker -- 0.25 packs/day for 20 years    Types: Cigarettes    Quit date: 12/13/2014  . Smokeless tobacco: Never Used  . Alcohol Use: No    Allergies  Allergen Reactions  . Morphine And Related Other (See Comments)    Reaction:  Headaches     Prior to Admission medications   Medication Sig Start Date End Date Taking? Authorizing Provider  acetaminophen (TYLENOL) 325 MG tablet Take 650 mg by mouth every 6 (six) hours as needed for mild pain or headache.    Yes Historical Provider, MD  albuterol (PROVENTIL HFA;VENTOLIN HFA) 108 (90 BASE) MCG/ACT inhaler Inhale 2 puffs into the lungs every 6 (six) hours as needed for wheezing or shortness of breath.   Yes Historical Provider, MD  amLODipine (  NORVASC) 10 MG tablet Take 1 tablet (10 mg total) by mouth daily. 05/05/15  Yes Enid Baas, MD  aspirin EC 81 MG tablet Take 81 mg by mouth daily.   Yes Historical Provider, MD  atorvastatin (LIPITOR) 20 MG tablet Take 20 mg by mouth at bedtime.    Yes Historical Provider, MD  carvedilol (COREG) 12.5 MG tablet Take 12.5 mg by mouth 2 (two) times daily.    Yes Historical Provider, MD  Fluticasone-Salmeterol (ADVAIR DISKUS) 250-50 MCG/DOSE AEPB Inhale 1 puff into the lungs 2 (two) times daily. 05/05/15  Yes Enid Baas, MD  gabapentin (NEURONTIN) 300 MG capsule Take 300 mg by mouth 3 (three) times daily.   Yes Historical Provider, MD  hydrALAZINE (APRESOLINE) 25 MG tablet Take 1 tablet (25 mg total) by mouth every 8 (eight) hours. 05/05/15  Yes Enid Baas, MD  HYDROcodone-acetaminophen (NORCO/VICODIN) 5-325 MG per tablet Take 1 tablet by mouth every 6 (six) hours as needed for moderate pain. 05/05/15  Yes Enid Baas, MD  lactulose (CHRONULAC) 10 GM/15ML solution Take 45 mLs (30 g total) by mouth 2 (two) times daily. 05/05/15  Yes Enid Baas, MD  pantoprazole (PROTONIX) 40 MG tablet Take 40 mg by mouth daily.   Yes Historical Provider, MD  predniSONE (STERAPRED UNI-PAK 21 TAB) 10 MG (21) TBPK tablet Take 1 tablet (10 mg total) by mouth daily. 6 tabs PO x 1 day 5 tabs PO x 1 day 4 tabs PO x 1 day 3 tabs PO x 1 day 2 tabs PO x 1 day 1 tab PO x 1 day and stop 05/05/15  Yes Enid Baas, MD  tiotropium (SPIRIVA) 18 MCG inhalation capsule Place 18 mcg into inhaler and inhale daily.   Yes Historical Provider, MD  nitroGLYCERIN (NITROSTAT) 0.4 MG SL tablet Place 1 tablet (0.4 mg total) under the tongue every 5 (five) minutes as needed for chest pain. 05/13/15   Delma Freeze, FNP  potassium chloride SA (K-DUR,KLOR-CON) 20 MEQ tablet Take 1 tablet (20 mEq total) by mouth 2 (two) times daily. 05/13/15   Delma Freeze, FNP  torsemide (DEMADEX) 20 MG tablet Take 2 tablets (40 mg total) by mouth 2 (two) times daily. 05/13/15   Delma Freeze, FNP     Review of Systems  Constitutional: Positive for malaise/fatigue, fatigue and unexpected weight change. Negative for appetite change.  HENT: Positive for congestion. Negative for sore throat and trouble swallowing.   Eyes: Negative.   Respiratory: Positive for chest tightness and  shortness of breath. Negative for cough and wheezing.   Cardiovascular: Positive for chest pain (last 2 days) and leg swelling. Negative for palpitations.  Gastrointestinal: Positive for nausea and abdominal pain. Negative for abdominal distention.  Endocrine: Negative.   Genitourinary: Negative.   Musculoskeletal: Positive for back pain (chronic in nature). Negative for neck pain.  Skin: Negative.   Allergic/Immunologic: Negative.   Neurological: Positive for dizziness and weakness. Negative for numbness and headaches.  Hematological: Negative for adenopathy. Bruises/bleeds easily.  Psychiatric/Behavioral: Positive for sleep disturbance (sleeping on 2 pillows due to back pain) and dysphoric mood. The patient is not nervous/anxious.        Objective:   Physical Exam  Constitutional: He is oriented to person, place, and time. He appears well-nourished. He appears listless. He is cooperative. He appears ill.  HENT:  Head: Normocephalic and atraumatic.  Eyes: Conjunctivae are normal. Pupils are equal, round, and reactive to light.  Neck: Normal range of motion. Neck supple.  Cardiovascular: Regular rhythm.  Bradycardia present.   Pulmonary/Chest: Effort normal. He has no wheezes. He has rhonchi in the right upper field. He has no rales.  Abdominal: He exhibits distension. There is tenderness.  Musculoskeletal: He exhibits edema (3+ pitting edema in bilateral lower legs). He exhibits no tenderness.  Neurological: He is oriented to person, place, and time. He appears listless.  Skin: Skin is warm and dry.  Psychiatric: He is slowed. He is not agitated. He exhibits a depressed mood.  Nursing note and vitals reviewed.   BP 160/82 mmHg  Pulse 55  Resp 20  Ht  (1.803 m)  Wt 364 lb (165.109 kg)  BMI 50.79 kg/m2  SpO2 97%       Assessment & Plan:  1: Acute on chronic heart failure with preserved ejection fraction- Patient presents with shortness of breath with little exertion  along with fatigue and chest pain. He feels like his symptoms have gotten worse over the last couple of days. By our scale, his weight is up 29 pounds from his previous visit here on 04/05/15. Patient looks like he doesn't feel well today and is shaking some due to chills. No fevers. Patient did not bring his medications in with him and can't really remember anything that he's taking other than furosemide  twice daily. May have been out of some medications as he says that he has to go pick them up today. Discussed patient with medical director who recommends changing his furosemide to torsemide  twice daily and adding potassium daily. He is to monitor his fluid and sodium intake carefully. Elevate his legs at home.  2: Chest pain- Patient has had intermittent chest pain over the years but this episode began 2 days ago. Not provoked by anything in particular and does not radiate anywhere. EKG done which is interpreted as afib but when overread by medical director, it's NSR. RX for SL NTG given to patient and he was instructed that he could use 1 under the tongue for chest pain and could repeat every 5 minutes for a total of 3 doses. Should his pain continue after 3, he should call 911. Patient's cardiologist called and he can be seen today at 1pm. Patient was informed but said he wasn't sure he could go. Discussed the importance of him following up with his cardiologist today. 2nd copy of EKG given to patient to give to his cardiologist.  3: Renal insufficiency- Patient has chronic renal insufficiency and am concerned about increasing/changing diuretic. Made an appointment with his nephrologist for 05/15/15 for further evaluation.   Return in 1 month or sooner for any questions/problems before then.

## 2015-05-17 ENCOUNTER — Inpatient Hospital Stay
Admission: EM | Admit: 2015-05-17 | Discharge: 2015-06-25 | DRG: 291 | Disposition: E | Payer: Self-pay | Attending: Internal Medicine | Admitting: Internal Medicine

## 2015-05-17 ENCOUNTER — Emergency Department: Payer: Self-pay

## 2015-05-17 ENCOUNTER — Emergency Department: Payer: MEDICAID

## 2015-05-17 ENCOUNTER — Encounter: Payer: Self-pay | Admitting: Emergency Medicine

## 2015-05-17 ENCOUNTER — Other Ambulatory Visit: Payer: Self-pay

## 2015-05-17 DIAGNOSIS — Z4659 Encounter for fitting and adjustment of other gastrointestinal appliance and device: Secondary | ICD-10-CM

## 2015-05-17 DIAGNOSIS — I129 Hypertensive chronic kidney disease with stage 1 through stage 4 chronic kidney disease, or unspecified chronic kidney disease: Secondary | ICD-10-CM | POA: Diagnosis present

## 2015-05-17 DIAGNOSIS — N186 End stage renal disease: Secondary | ICD-10-CM | POA: Diagnosis present

## 2015-05-17 DIAGNOSIS — K7682 Hepatic encephalopathy: Secondary | ICD-10-CM

## 2015-05-17 DIAGNOSIS — R109 Unspecified abdominal pain: Secondary | ICD-10-CM

## 2015-05-17 DIAGNOSIS — R319 Hematuria, unspecified: Secondary | ICD-10-CM | POA: Diagnosis present

## 2015-05-17 DIAGNOSIS — T426X5A Adverse effect of other antiepileptic and sedative-hypnotic drugs, initial encounter: Secondary | ICD-10-CM | POA: Diagnosis present

## 2015-05-17 DIAGNOSIS — G92 Toxic encephalopathy: Secondary | ICD-10-CM | POA: Diagnosis present

## 2015-05-17 DIAGNOSIS — Z66 Do not resuscitate: Secondary | ICD-10-CM | POA: Diagnosis present

## 2015-05-17 DIAGNOSIS — E669 Obesity, unspecified: Secondary | ICD-10-CM

## 2015-05-17 DIAGNOSIS — Z9889 Other specified postprocedural states: Secondary | ICD-10-CM

## 2015-05-17 DIAGNOSIS — I2510 Atherosclerotic heart disease of native coronary artery without angina pectoris: Secondary | ICD-10-CM | POA: Diagnosis present

## 2015-05-17 DIAGNOSIS — Z79899 Other long term (current) drug therapy: Secondary | ICD-10-CM

## 2015-05-17 DIAGNOSIS — J9622 Acute and chronic respiratory failure with hypercapnia: Secondary | ICD-10-CM | POA: Diagnosis present

## 2015-05-17 DIAGNOSIS — J9621 Acute and chronic respiratory failure with hypoxia: Secondary | ICD-10-CM | POA: Diagnosis present

## 2015-05-17 DIAGNOSIS — J9690 Respiratory failure, unspecified, unspecified whether with hypoxia or hypercapnia: Secondary | ICD-10-CM

## 2015-05-17 DIAGNOSIS — I4891 Unspecified atrial fibrillation: Secondary | ICD-10-CM | POA: Diagnosis present

## 2015-05-17 DIAGNOSIS — I959 Hypotension, unspecified: Secondary | ICD-10-CM | POA: Diagnosis present

## 2015-05-17 DIAGNOSIS — J9600 Acute respiratory failure, unspecified whether with hypoxia or hypercapnia: Secondary | ICD-10-CM

## 2015-05-17 DIAGNOSIS — Z87891 Personal history of nicotine dependence: Secondary | ICD-10-CM

## 2015-05-17 DIAGNOSIS — R0902 Hypoxemia: Secondary | ICD-10-CM

## 2015-05-17 DIAGNOSIS — N179 Acute kidney failure, unspecified: Secondary | ICD-10-CM | POA: Diagnosis present

## 2015-05-17 DIAGNOSIS — K7460 Unspecified cirrhosis of liver: Secondary | ICD-10-CM | POA: Diagnosis present

## 2015-05-17 DIAGNOSIS — J677 Air conditioner and humidifier lung: Secondary | ICD-10-CM

## 2015-05-17 DIAGNOSIS — J154 Pneumonia due to other streptococci: Secondary | ICD-10-CM | POA: Diagnosis present

## 2015-05-17 DIAGNOSIS — R5383 Other fatigue: Secondary | ICD-10-CM

## 2015-05-17 DIAGNOSIS — I248 Other forms of acute ischemic heart disease: Secondary | ICD-10-CM | POA: Diagnosis present

## 2015-05-17 DIAGNOSIS — Z8672 Personal history of thrombophlebitis: Secondary | ICD-10-CM

## 2015-05-17 DIAGNOSIS — J209 Acute bronchitis, unspecified: Secondary | ICD-10-CM | POA: Diagnosis present

## 2015-05-17 DIAGNOSIS — I5033 Acute on chronic diastolic (congestive) heart failure: Principal | ICD-10-CM | POA: Diagnosis present

## 2015-05-17 DIAGNOSIS — K265 Chronic or unspecified duodenal ulcer with perforation: Secondary | ICD-10-CM | POA: Diagnosis present

## 2015-05-17 DIAGNOSIS — E875 Hyperkalemia: Secondary | ICD-10-CM | POA: Diagnosis present

## 2015-05-17 DIAGNOSIS — R34 Anuria and oliguria: Secondary | ICD-10-CM | POA: Diagnosis present

## 2015-05-17 DIAGNOSIS — E872 Acidosis: Secondary | ICD-10-CM | POA: Diagnosis present

## 2015-05-17 DIAGNOSIS — M549 Dorsalgia, unspecified: Secondary | ICD-10-CM | POA: Diagnosis present

## 2015-05-17 DIAGNOSIS — Z9981 Dependence on supplemental oxygen: Secondary | ICD-10-CM

## 2015-05-17 DIAGNOSIS — K7290 Hepatic failure, unspecified without coma: Secondary | ICD-10-CM | POA: Diagnosis present

## 2015-05-17 DIAGNOSIS — G4733 Obstructive sleep apnea (adult) (pediatric): Secondary | ICD-10-CM | POA: Diagnosis present

## 2015-05-17 DIAGNOSIS — Z885 Allergy status to narcotic agent status: Secondary | ICD-10-CM

## 2015-05-17 DIAGNOSIS — E662 Morbid (severe) obesity with alveolar hypoventilation: Secondary | ICD-10-CM | POA: Diagnosis present

## 2015-05-17 DIAGNOSIS — Z7951 Long term (current) use of inhaled steroids: Secondary | ICD-10-CM

## 2015-05-17 DIAGNOSIS — Z6841 Body Mass Index (BMI) 40.0 and over, adult: Secondary | ICD-10-CM

## 2015-05-17 DIAGNOSIS — R0602 Shortness of breath: Secondary | ICD-10-CM

## 2015-05-17 DIAGNOSIS — J440 Chronic obstructive pulmonary disease with acute lower respiratory infection: Secondary | ICD-10-CM | POA: Diagnosis present

## 2015-05-17 DIAGNOSIS — Z8619 Personal history of other infectious and parasitic diseases: Secondary | ICD-10-CM

## 2015-05-17 DIAGNOSIS — I509 Heart failure, unspecified: Secondary | ICD-10-CM

## 2015-05-17 DIAGNOSIS — Z803 Family history of malignant neoplasm of breast: Secondary | ICD-10-CM

## 2015-05-17 DIAGNOSIS — R198 Other specified symptoms and signs involving the digestive system and abdomen: Secondary | ICD-10-CM

## 2015-05-17 DIAGNOSIS — D696 Thrombocytopenia, unspecified: Secondary | ICD-10-CM | POA: Diagnosis present

## 2015-05-17 DIAGNOSIS — G8929 Other chronic pain: Secondary | ICD-10-CM | POA: Diagnosis present

## 2015-05-17 DIAGNOSIS — R32 Unspecified urinary incontinence: Secondary | ICD-10-CM | POA: Diagnosis present

## 2015-05-17 DIAGNOSIS — Z823 Family history of stroke: Secondary | ICD-10-CM

## 2015-05-17 DIAGNOSIS — R809 Proteinuria, unspecified: Secondary | ICD-10-CM | POA: Diagnosis present

## 2015-05-17 DIAGNOSIS — T884XXA Failed or difficult intubation, initial encounter: Secondary | ICD-10-CM

## 2015-05-17 DIAGNOSIS — J441 Chronic obstructive pulmonary disease with (acute) exacerbation: Secondary | ICD-10-CM | POA: Diagnosis present

## 2015-05-17 DIAGNOSIS — R001 Bradycardia, unspecified: Secondary | ICD-10-CM | POA: Diagnosis present

## 2015-05-17 DIAGNOSIS — J9601 Acute respiratory failure with hypoxia: Secondary | ICD-10-CM

## 2015-05-17 DIAGNOSIS — G9341 Metabolic encephalopathy: Secondary | ICD-10-CM

## 2015-05-17 DIAGNOSIS — G928 Other toxic encephalopathy: Secondary | ICD-10-CM

## 2015-05-17 HISTORY — DX: Heart failure, unspecified: I50.9

## 2015-05-17 HISTORY — DX: Unspecified viral hepatitis B without hepatic coma: B19.10

## 2015-05-17 LAB — COMPREHENSIVE METABOLIC PANEL
ALT: 15 U/L — AB (ref 17–63)
ANION GAP: 8 (ref 5–15)
AST: 14 U/L — ABNORMAL LOW (ref 15–41)
Albumin: 3 g/dL — ABNORMAL LOW (ref 3.5–5.0)
Alkaline Phosphatase: 62 U/L (ref 38–126)
BUN: 76 mg/dL — ABNORMAL HIGH (ref 6–20)
CHLORIDE: 105 mmol/L (ref 101–111)
CO2: 22 mmol/L (ref 22–32)
Calcium: 8 mg/dL — ABNORMAL LOW (ref 8.9–10.3)
Creatinine, Ser: 4.21 mg/dL — ABNORMAL HIGH (ref 0.61–1.24)
GFR, EST AFRICAN AMERICAN: 17 mL/min — AB (ref >60)
GFR, EST NON AFRICAN AMERICAN: 15 mL/min — AB (ref >60)
Glucose, Bld: 181 mg/dL — ABNORMAL HIGH (ref 65–99)
POTASSIUM: 5 mmol/L (ref 3.5–5.1)
SODIUM: 135 mmol/L (ref 135–145)
Total Bilirubin: 0.8 mg/dL (ref 0.3–1.2)
Total Protein: 6.2 g/dL — ABNORMAL LOW (ref 6.5–8.1)

## 2015-05-17 LAB — CBC
HCT: 31.5 % — ABNORMAL LOW (ref 40.0–52.0)
HEMOGLOBIN: 9.9 g/dL — AB (ref 13.0–18.0)
MCH: 26.8 pg (ref 26.0–34.0)
MCHC: 31.4 g/dL — AB (ref 32.0–36.0)
MCV: 85.4 fL (ref 80.0–100.0)
PLATELETS: 67 10*3/uL — AB (ref 150–440)
RBC: 3.69 MIL/uL — ABNORMAL LOW (ref 4.40–5.90)
RDW: 20.4 % — AB (ref 11.5–14.5)
WBC: 4.5 10*3/uL (ref 3.8–10.6)

## 2015-05-17 LAB — TROPONIN I: TROPONIN I: 0.03 ng/mL (ref <0.031)

## 2015-05-17 LAB — BRAIN NATRIURETIC PEPTIDE: B NATRIURETIC PEPTIDE 5: 1343 pg/mL — AB (ref 0.0–100.0)

## 2015-05-17 MED ORDER — POTASSIUM CHLORIDE CRYS ER 20 MEQ PO TBCR: 20.0000 meq | EXTENDED_RELEASE_TABLET | Freq: Two times a day (BID) | ORAL | Status: DC

## 2015-05-17 MED ORDER — HEPARIN SODIUM (PORCINE) 5000 UNIT/ML IJ SOLN
5000.0000 [IU] | Freq: Three times a day (TID) | INTRAMUSCULAR | Status: DC
  Administered 2015-05-17 – 2015-05-23 (×14): 5000 [IU] via SUBCUTANEOUS
  Filled 2015-05-17 (×15): qty 1

## 2015-05-17 MED ORDER — CLOPIDOGREL BISULFATE 75 MG PO TABS: 75.0000 mg | ORAL_TABLET | Freq: Every day | ORAL | Status: DC

## 2015-05-17 MED ORDER — HYDROCODONE-ACETAMINOPHEN 5-325 MG PO TABS
1.0000 | ORAL_TABLET | Freq: Four times a day (QID) | ORAL | Status: DC | PRN
  Administered 2015-05-17 – 2015-05-24 (×6): 1 via ORAL
  Filled 2015-05-17 (×6): qty 1

## 2015-05-17 MED ORDER — FUROSEMIDE 10 MG/ML IJ SOLN
120.0000 mg | Freq: Once | INTRAVENOUS | Status: AC
  Administered 2015-05-17: 120 mg via INTRAVENOUS
  Filled 2015-05-17: qty 12

## 2015-05-17 MED ORDER — LACTULOSE 10 GM/15ML PO SOLN
30.0000 g | Freq: Two times a day (BID) | ORAL | Status: DC
  Administered 2015-05-17 – 2015-05-21 (×7): 30 g via ORAL
  Filled 2015-05-17 (×10): qty 60

## 2015-05-17 MED ORDER — ASPIRIN EC 81 MG PO TBEC
81.0000 mg | DELAYED_RELEASE_TABLET | Freq: Every day | ORAL | Status: DC
Start: 2015-05-17 — End: 2015-05-27
  Administered 2015-05-18 – 2015-05-26 (×5): 81 mg via ORAL
  Filled 2015-05-17 (×8): qty 1

## 2015-05-17 MED ORDER — SPIRONOLACTONE 25 MG PO TABS
25.0000 mg | ORAL_TABLET | Freq: Every day | ORAL | Status: DC
  Filled 2015-05-17: qty 1

## 2015-05-17 MED ORDER — AMLODIPINE BESYLATE 10 MG PO TABS
10.0000 mg | ORAL_TABLET | Freq: Every day | ORAL | Status: DC
  Administered 2015-05-18: 10 mg via ORAL
  Filled 2015-05-17: qty 1

## 2015-05-17 MED ORDER — NITROGLYCERIN 0.4 MG SL SUBL: 0.4000 mg | SUBLINGUAL_TABLET | SUBLINGUAL | Status: DC | PRN

## 2015-05-17 MED ORDER — TIOTROPIUM BROMIDE MONOHYDRATE 18 MCG IN CAPS
18.0000 ug | ORAL_CAPSULE | Freq: Every day | RESPIRATORY_TRACT | Status: DC
  Administered 2015-05-18 – 2015-05-19 (×2): 18 ug via RESPIRATORY_TRACT
  Filled 2015-05-17: qty 5

## 2015-05-17 MED ORDER — ATORVASTATIN CALCIUM 20 MG PO TABS
20.0000 mg | ORAL_TABLET | Freq: Every day | ORAL | Status: DC
  Administered 2015-05-17 – 2015-05-26 (×9): 20 mg via ORAL
  Filled 2015-05-17 (×9): qty 1

## 2015-05-17 MED ORDER — GABAPENTIN 300 MG PO CAPS
300.0000 mg | ORAL_CAPSULE | Freq: Three times a day (TID) | ORAL | Status: DC
  Administered 2015-05-17 – 2015-05-19 (×7): 300 mg via ORAL
  Filled 2015-05-17 (×7): qty 1

## 2015-05-17 MED ORDER — IPRATROPIUM-ALBUTEROL 0.5-2.5 (3) MG/3ML IN SOLN
3.0000 mL | Freq: Four times a day (QID) | RESPIRATORY_TRACT | Status: DC
  Administered 2015-05-17 – 2015-05-19 (×8): 3 mL via RESPIRATORY_TRACT
  Filled 2015-05-17 (×8): qty 3

## 2015-05-17 MED ORDER — ALBUTEROL SULFATE (2.5 MG/3ML) 0.083% IN NEBU: 2.5000 mg | INHALATION_SOLUTION | Freq: Four times a day (QID) | RESPIRATORY_TRACT | Status: DC | PRN

## 2015-05-17 MED ORDER — CLOPIDOGREL BISULFATE 75 MG PO TABS
75.0000 mg | ORAL_TABLET | Freq: Every day | ORAL | Status: DC
  Administered 2015-05-18 – 2015-05-22 (×3): 75 mg via ORAL
  Filled 2015-05-17 (×7): qty 1

## 2015-05-17 MED ORDER — BUDESONIDE 0.25 MG/2ML IN SUSP
0.2500 mg | Freq: Two times a day (BID) | RESPIRATORY_TRACT | Status: DC
  Administered 2015-05-17 – 2015-05-20 (×7): 0.25 mg via RESPIRATORY_TRACT
  Filled 2015-05-17 (×7): qty 2

## 2015-05-17 MED ORDER — POTASSIUM CHLORIDE CRYS ER 20 MEQ PO TBCR: 20.0000 meq | EXTENDED_RELEASE_TABLET | Freq: Once | ORAL | Status: DC

## 2015-05-17 MED ORDER — LEVOFLOXACIN IN D5W 250 MG/50ML IV SOLN
250.0000 mg | INTRAVENOUS | Status: DC
  Administered 2015-05-17 – 2015-05-18 (×2): 250 mg via INTRAVENOUS
  Filled 2015-05-17 (×3): qty 50

## 2015-05-17 MED ORDER — METHYLPREDNISOLONE SODIUM SUCC 125 MG IJ SOLR
60.0000 mg | Freq: Three times a day (TID) | INTRAMUSCULAR | Status: DC
  Administered 2015-05-17 – 2015-05-19 (×6): 60 mg via INTRAVENOUS
  Filled 2015-05-17 (×6): qty 2

## 2015-05-17 MED ORDER — PANTOPRAZOLE SODIUM 40 MG PO TBEC
40.0000 mg | DELAYED_RELEASE_TABLET | Freq: Every day | ORAL | Status: DC
  Administered 2015-05-17 – 2015-05-19 (×3): 40 mg via ORAL
  Filled 2015-05-17 (×3): qty 1

## 2015-05-17 MED ORDER — FUROSEMIDE 10 MG/ML IJ SOLN
80.0000 mg | Freq: Two times a day (BID) | INTRAMUSCULAR | Status: DC
  Administered 2015-05-18 – 2015-05-26 (×18): 80 mg via INTRAVENOUS
  Filled 2015-05-17 (×19): qty 8

## 2015-05-17 MED ORDER — ALBUTEROL SULFATE HFA 108 (90 BASE) MCG/ACT IN AERS: 2.0000 | INHALATION_SPRAY | Freq: Four times a day (QID) | RESPIRATORY_TRACT | Status: DC | PRN

## 2015-05-17 MED ORDER — ISOSORBIDE MONONITRATE ER 30 MG PO TB24
30.0000 mg | ORAL_TABLET | Freq: Every day | ORAL | Status: DC
  Administered 2015-05-18 – 2015-05-19 (×2): 30 mg via ORAL
  Filled 2015-05-17 (×4): qty 1

## 2015-05-17 NOTE — Progress Notes (Signed)
A & O. Takes meds ok. Gary Juarez. Lasix drip started. Skin abrasion to L shin. Meds stored in pharmacy. Pt has no further concerns at this time. Skin checked by Leighton Parody RN

## 2015-05-17 NOTE — ED Provider Notes (Signed)
[facility] Emergency Department Provider Note  ____________________________________________  Time seen: 1400  I have reviewed the triage vital signs and the nursing notes.   HISTORY  Chief Complaint Shortness of Breath  weight gain    HPI Gary Juarez is a 55 y.o. male with a history of CHF and COPD area he was recently in the hospital with hypoxia. I saw him the emergency department at that time. He now returns complaining of a 10 pound weight gain over the past 2 days and increased shortness of breath and swelling.     Past Medical History  Diagnosis Date  . Hypertension   . COPD (chronic obstructive pulmonary disease)   . Chronic kidney disease   . Chronic back pain greater than 3 months duration   . Phlebitis 1992    left calf  . Anxiety and depression   . CHF (congestive heart failure)   . Hepatitis B     Patient Active Problem List   Diagnosis Date Noted  . Renal insufficiency 05/13/2015  . Diastolic CHF 05/05/2015  . COPD exacerbation 05/02/2015  . Acute respiratory failure with hypercapnia 05/01/2015  . Chronic diastolic heart failure 04/05/2015  . Back pain 11/30/2014  . HTN (hypertension) 11/30/2014    Past Surgical History  Procedure Laterality Date  . Wisdom tooth extraction      Current Outpatient Rx  Name  Route  Sig  Dispense  Refill  . acetaminophen (TYLENOL) 325 MG tablet   Oral   Take 650 mg by mouth every 6 (six) hours as needed for mild pain or headache.          . albuterol (PROVENTIL HFA;VENTOLIN HFA) 108 (90 BASE) MCG/ACT inhaler   Inhalation   Inhale 2 puffs into the lungs every 6 (six) hours as needed for wheezing or shortness of breath.         Marland Kitchen amLODipine (NORVASC) 10 MG tablet   Oral   Take 1 tablet (10 mg total) by mouth daily.   30 tablet   2   . aspirin EC 81 MG tablet   Oral   Take 81 mg by mouth daily.         Marland Kitchen atorvastatin (LIPITOR) 20 MG tablet   Oral   Take 20 mg by mouth  at bedtime.          . carvedilol (COREG) 12.5 MG tablet   Oral   Take 12.5 mg by mouth 2 (two) times daily.         . Fluticasone-Salmeterol (ADVAIR DISKUS) 250-50 MCG/DOSE AEPB   Inhalation   Inhale 1 puff into the lungs 2 (two) times daily.   60 each   2   . gabapentin (NEURONTIN) 300 MG capsule   Oral   Take 300 mg by mouth 3 (three) times daily.         . hydrALAZINE (APRESOLINE) 25 MG tablet   Oral   Take 1 tablet (25 mg total) by mouth every 8 (eight) hours.   90 tablet   2   . HYDROcodone-acetaminophen (NORCO/VICODIN) 5-325 MG per tablet   Oral   Take 1 tablet by mouth every 6 (six) hours as needed for moderate pain.   25 tablet   0   . lactulose (CHRONULAC) 10 GM/15ML solution   Oral   Take 45 mLs (30 g total) by mouth 2 (two) times daily.   240 mL   1     Titrate to 2 bowel  movements/day   . nitroGLYCERIN (NITROSTAT) 0.4 MG SL tablet   Sublingual   Place 1 tablet (0.4 mg total) under the tongue every 5 (five) minutes as needed for chest pain.   20 tablet   0   . pantoprazole (PROTONIX) 40 MG tablet   Oral   Take 40 mg by mouth daily.         . potassium chloride SA (K-DUR,KLOR-CON) 20 MEQ tablet   Oral   Take 1 tablet (20 mEq total) by mouth 2 (two) times daily.   60 tablet   0   . predniSONE (STERAPRED UNI-PAK 21 TAB) 10 MG (21) TBPK tablet   Oral   Take 1 tablet (10 mg total) by mouth daily. 6 tabs PO x 1 day 5 tabs PO x 1 day 4 tabs PO x 1 day 3 tabs PO x 1 day 2 tabs PO x 1 day 1 tab PO x 1 day and stop   21 tablet   0   . tiotropium (SPIRIVA) 18 MCG inhalation capsule   Inhalation   Place 18 mcg into inhaler and inhale daily.         Marland Kitchen torsemide (DEMADEX) 20 MG tablet   Oral   Take 2 tablets (40 mg total) by mouth 2 (two) times daily.   120 tablet   0     Allergies Morphine and related  Family History  Problem Relation Age of Onset  . Stroke Mother   . Cancer Mother     Breast    Social History Social  History  Substance Use Topics  . Smoking status: Former Smoker -- 0.25 packs/day for 20 years    Types: Cigarettes    Quit date: 12/13/2014  . Smokeless tobacco: Never Used  . Alcohol Use: No    Review of Systems  Constitutional: Negative for fever. ENT: Negative for sore throat. Cardiovascular: Negative for chest pain. Respiratory: Positive for shortness of breath. History of COPD and CHF. See history of present illness Gastrointestinal: No abdominal pain, but reports tightness in his abdomen. Genitourinary: Negative for dysuria. Musculoskeletal: Notable for peripheral edema. Skin: Negative for rash. Neurological: Negative for headaches   10-point ROS otherwise negative.  ____________________________________________   PHYSICAL EXAM:  VITAL SIGNS: ED Triage Vitals  Enc Vitals Group     BP 05/18/2015 1359 101/61 mmHg     Pulse Rate 05/12/2015 1359 45     Resp 05/01/2015 1359 20     Temp 04/29/2015 1359 97.4 F (36.3 C)     Temp Source 05/13/2015 1359 Oral     SpO2 05/03/2015 1356 94 %     Weight 05/18/2015 1359 392 lb 13.8 oz (178.201 kg)     Height 05/09/2015 1359  (1.702 m)     Head Cir --      Peak Flow --      Pain Score 05/12/2015 1400 6     Pain Loc --      Pain Edu? --      Excl. in GC? --     Constitutional: Large body habitus, slightly somnolent but responds to voice and answers questions appropriately. ENT   Head: Normocephalic and atraumatic.   Nose: No congestion/rhinnorhea.   Mouth/Throat: Mucous membranes are moist. Cardiovascular: Bradycardia at 45, no murmur noted Respiratory:  Normal respiratory effort, no tachypnea.    Distant Breath sounds bilaterally.  Gastrointestinal: Mild tautness, distention, with minimal discomfort.  Back: No muscle spasm, no tenderness, no CVA tenderness. Musculoskeletal: No  deformity noted. Nontender with normal range of motion in all extremities. Notable for edema in both legs. Neurologic:  Normal speech and language.  No gross focal neurologic deficits are appreciated.  Skin:  Skin is warm, dry. No rash noted. Psychiatric: Patient is interactive and can answer basic questions, but appears somewhat somnolent and is not fully interactive.  ____________________________________________    LABS (pertinent positives/negatives)  Labs Reviewed  CBC - Abnormal; Notable for the following:    RBC 3.69 (*)    Hemoglobin 9.9 (*)    HCT 31.5 (*)    MCHC 31.4 (*)    RDW 20.4 (*)    Platelets 67 (*)    All other components within normal limits  COMPREHENSIVE METABOLIC PANEL - Abnormal; Notable for the following:    Glucose, Bld 181 (*)    BUN 76 (*)    Creatinine, Ser 4.21 (*)    Calcium 8.0 (*)    Total Protein 6.2 (*)    Albumin 3.0 (*)    AST 14 (*)    ALT 15 (*)    GFR calc non Af Amer 15 (*)    GFR calc Af Amer 17 (*)    All other components within normal limits  BRAIN NATRIURETIC PEPTIDE - Abnormal; Notable for the following:    B Natriuretic Peptide 1343.0 (*)    All other components within normal limits  TROPONIN I     ____________________________________________   EKG  ED ECG REPORT I, KAMINSKI,DAVID W, the attending physician, personally viewed and interpreted this ECG.   Date: 2015/05/20  EKG Time: 1400  Rate: 41  Rhythm: Sinus bradycardia  Axis: Normal  Intervals: Normal  ST&T Change: None noted   ____________________________________________    RADIOLOGY  Chest x-ray: IMPRESSION: Stable severe cardiomegaly  Bilateral airspace disease is improved at the right base, stable at the left base but worse in the right upper lobe.    ____________________________________________   PROCEDURES   ____________________________________________   INITIAL IMPRESSION / ASSESSMENT AND PLAN / ED COURSE  Pertinent labs & imaging results that were available during my care of the patient were reviewed by me and considered in my medical decision making (see chart for  details).  Ill-appearing 54 year old male with history of CHF and COPD with increased swelling and increased shortness of breath.  He reports is been taking his usual diuretics at home but has been retaining fluid and gaining weight regardless.  ----------------------------------------- 3:21 PM on 2015-05-20 -----------------------------------------  Hemoglobin of 9.9, BUN of 76 and creatinine of 4.21. These renal function tests are similar to what he has had, but do appear to be slowly worsening and are higher than when he just had a few weeks ago.  The patient's BNP level is 1343. His chest x-ray shows stable severe cardiomegaly. He does not appear to be in florid congestive heart failure.  The patient would likely benefit from diuresis, but with his renal function test being where they are and his overall delicate medical condition, I'm reluctant to give him additional Lasix at this time.   He does have an increasing oxygen requirement.  We will seek admission hospital for ongoing care for this patient with significant ongoing chronic illnesses with mild acute exacerbation.    ____________________________________________   FINAL CLINICAL IMPRESSION(S) / ED DIAGNOSES  Final diagnoses:  Acute on chronic congestive heart failure, unspecified congestive heart failure type  Shortness of breath  Hypoxia      Darien Ramus, MD 20-May-2015 1525

## 2015-05-17 NOTE — ED Notes (Signed)
MD to bedside.

## 2015-05-17 NOTE — ED Notes (Signed)
Pt here from home via [facility]. Reports shortness of breath for the past few days, worse today. EMS reports wheezing in all lung fields, pt normally wears 2.5L of oxygen at home, EMS placed on 4L with 94% oxygen sat. Pt reports he's gained 10lbs in past 2 days.

## 2015-05-17 NOTE — H&P (Signed)
[facility] - [place] [facility]   PATIENT NAME: Gary Juarez    MR#:  [id number]  DATE OF BIRTH:  [birth year]  DATE OF ADMISSION:  2015-06-08  PRIMARY CARE PHYSICIAN: Verlee Monte, PA-C   REQUESTING/REFERRING PHYSICIAN: Janalyn Harder, M.D.  CHIEF COMPLAINT:   Chief Complaint  Patient presents with  . Shortness of Breath    HISTORY OF PRESENT ILLNESS:  Gary Juarez  is a 54 y.o. male with a known history of diastolic congestive heart failure, COPD and stage on oxygen, hepatitis B, chronic kidney disease stage IV, hypertension. He presents with shortness of breath and weight gain. He gained 10 pounds in 3 days. His nurse stopped by today and referred him into the hospital for heavy breathing. He feels tightness in his abdomen and also chest tightness. Not much cough. He does feel nauseous. When I was talking with him he was shaking and very cold.  PAST MEDICAL HISTORY:   Past Medical History  Diagnosis Date  . Hypertension   . COPD (chronic obstructive pulmonary disease)   . Chronic kidney disease   . Chronic back pain greater than 3 months duration   . Phlebitis 1992    left calf  . Anxiety and depression   . CHF (congestive heart failure)   . Hepatitis B     PAST SURGICAL HISTORY:   Past Surgical History  Procedure Laterality Date  . Wisdom tooth extraction      SOCIAL HISTORY:   Social History  Substance Use Topics  . Smoking status: Former Smoker -- 0.25 packs/day for 20 years    Types: Cigarettes    Quit date: 12/13/2014  . Smokeless tobacco: Never Used  . Alcohol Use: No    FAMILY HISTORY:   Family History  Problem Relation Age of Onset  . Stroke Mother   . Cancer Mother     Breast    DRUG ALLERGIES:   Allergies  Allergen Reactions  . Morphine And Related Other (See Comments)    Reaction:  Headaches     REVIEW OF SYSTEMS:  CONSTITUTIONAL: No fever, positive for chills, positive for weight gain 10 pounds  in 3 days.  EYES: Occasional double vision. Wears glasses EARS, NOSE, AND THROAT: No tinnitus or ear pain. No sore throat. Dysphasia. RESPIRATORY: No cough, positive for occasional shortness of breath, wheezing, no hemoptysis.  CARDIOVASCULAR: Positive chest pain, no orthopnea, edema.  GASTROINTESTINAL: Positive for nausea, no vomiting, or abdominal pain. No blood in bowel movements. Some loose stool the other day. GENITOURINARY: No dysuria, hematuria.  ENDOCRINE: No polyuria, nocturia,  HEMATOLOGY: No anemia, easy bruising or bleeding SKIN: Weeping lesion lower extremity MUSCULOSKELETAL: Positive for muscle pain, low back pain NEUROLOGIC: No tingling, numbness, weakness.  PSYCHIATRY: No anxiety or depression.   MEDICATIONS AT HOME:   Prior to Admission medications   Medication Sig Start Date End Date Taking? Authorizing Provider  albuterol (PROVENTIL HFA;VENTOLIN HFA) 108 (90 BASE) MCG/ACT inhaler Inhale 2 puffs into the lungs every 6 (six) hours as needed for wheezing or shortness of breath.   Yes Historical Provider, MD  amLODipine (NORVASC) 10 MG tablet Take 1 tablet (10 mg total) by mouth daily. 05/05/15  Yes Enid Baas, MD  aspirin EC 81 MG tablet Take 81 mg by mouth daily.   Yes Historical Provider, MD  atorvastatin (LIPITOR) 20 MG tablet Take 20 mg by mouth at bedtime.    Yes Historical Provider, MD  carvedilol (COREG) 12.5 MG tablet Take 12.5  mg by mouth 2 (two) times daily.   Yes Historical Provider, MD  clopidogrel (PLAVIX) 75 MG tablet Take 75 mg by mouth daily.   Yes Historical Provider, MD  Fluticasone-Salmeterol (ADVAIR DISKUS) 250-50 MCG/DOSE AEPB Inhale 1 puff into the lungs 2 (two) times daily. 05/05/15  Yes Enid Baas, MD  gabapentin (NEURONTIN) 300 MG capsule Take 300 mg by mouth 3 (three) times daily.   Yes Historical Provider, MD  hydrALAZINE (APRESOLINE) 25 MG tablet Take 1 tablet (25 mg total) by mouth every 8 (eight) hours. 05/05/15  Yes Enid Baas, MD  HYDROcodone-acetaminophen (NORCO/VICODIN) 5-325 MG per tablet Take 1 tablet by mouth every 6 (six) hours as needed for moderate pain. 05/05/15  Yes Enid Baas, MD  isosorbide mononitrate (IMDUR) 30 MG 24 hr tablet Take 30 mg by mouth daily.   Yes Historical Provider, MD  lactulose (CHRONULAC) 10 GM/15ML solution Take 45 mLs (30 g total) by mouth 2 (two) times daily. 05/05/15  Yes Enid Baas, MD  nitroGLYCERIN (NITROSTAT) 0.4 MG SL tablet Place 1 tablet (0.4 mg total) under the tongue every 5 (five) minutes as needed for chest pain. 05/13/15  Yes Delma Freeze, FNP  pantoprazole (PROTONIX) 40 MG tablet Take 40 mg by mouth at bedtime.    Yes Historical Provider, MD  potassium chloride SA (K-DUR,KLOR-CON) 20 MEQ tablet Take 1 tablet (20 mEq total) by mouth 2 (two) times daily. 05/13/15  Yes Delma Freeze, FNP  spironolactone (ALDACTONE) 25 MG tablet Take 25 mg by mouth daily.   Yes Historical Provider, MD  tiotropium (SPIRIVA) 18 MCG inhalation capsule Place 18 mcg into inhaler and inhale daily.   Yes Historical Provider, MD  torsemide (DEMADEX) 20 MG tablet Take 2 tablets (40 mg total) by mouth 2 (two) times daily. 05/13/15  Yes Delma Freeze, FNP  predniSONE (STERAPRED UNI-PAK 21 TAB) 10 MG (21) TBPK tablet Take 1 tablet (10 mg total) by mouth daily. 6 tabs PO x 1 day 5 tabs PO x 1 day 4 tabs PO x 1 day 3 tabs PO x 1 day 2 tabs PO x 1 day 1 tab PO x 1 day and stop Patient not taking: Reported on 05/24/15 05/05/15   Enid Baas, MD      VITAL SIGNS:  Blood pressure 101/61, pulse 45, temperature 97.4 F (36.3 C), temperature source Oral, resp. rate 20, height  (1.702 m), weight 178.201 kg (392 lb 13.8 oz), SpO2 97 %.  PHYSICAL EXAMINATION:  GENERAL:  54 y.o.-year-old patient lying in the bed with no acute distress.  EYES: Pupils equal, round, reactive to light and accommodation. No scleral icterus. Extraocular muscles intact.  HEENT: Head atraumatic,  normocephalic. Oropharynx and nasopharynx clear.  NECK:  Supple, no jugular venous distention. No thyroid enlargement, no tenderness.  LUNGS: bDecreased reath sounds bilaterally,  positive wheezing throughout lung field,  norales,rhonchi or crepitation. No use of accessory muscles of respiration.  CARDIOVASCULAR: S1, S2 bradycardic. No murmurs, rubs, or gallops.  ABDOMEN: Soft, nontender, nondistended. Bowel sounds present. No organomegaly or mass.  EXTREMITIES:  3+ edema, no cyanosis, or clubbing.  NEUROLOGIC: Cranial nerves II through XII are intact. Muscle strength 5/5 in all extremities. Sensation intact. Gait not checked.  PSYCHIATRIC: The patient is alert and oriented x 3.  SKIN: chronic lower extremity discoloration bilateral lower extremity, weeping ulceration of the left lower extremity  LABORATORY PANEL:   CBC  Recent Labs Lab 2015/05/24 1402  WBC 4.5  HGB 9.9*  HCT  31.5*  PLT 67*   ------------------------------------------------------------------------------------------------------------------  Chemistries   Recent Labs Lab 05/18/2015 1402  NA 135  K 5.0  CL 105  CO2 22  GLUCOSE 181*  BUN 76*  CREATININE 4.21*  CALCIUM 8.0*  AST 14*  ALT 15*  ALKPHOS 62  BILITOT 0.8   ------------------------------------------------------------------------------------------------------------------  Cardiac Enzymes  Recent Labs Lab 04/30/2015 1402  TROPONINI 0.03   ------------------------------------------------------------------------------------------------------------------  RADIOLOGY:  Dg Chest Port 1 View  05/02/2015   CLINICAL DATA:  Short of breath  EXAM: PORTABLE CHEST 1 VIEW  COMPARISON:  05/01/2015  FINDINGS: The heart remains severely enlarged. Airspace disease at the right base has improved. Airspace disease in the medial right upper lung zone has worsened. Opacity at the left base persists. No pneumothorax.  IMPRESSION: Stable severe cardiomegaly  Bilateral  airspace disease is improved at the right base, stable at the left base but worse in the right upper lobe.   Electronically Signed   By: Jolaine Click M.D.   On: 05/04/2015 14:36    EKG:   Sinus bradycardia 41 bpm  IMPRESSION AND PLAN:   1. Acute on chronic respiratory failure- continue oxygen of 2 L nasal cannula. Decrease oxygen from 4 L that was on in the emergency room. 2. COPD exacerbation- start IV Solu-Medrol, Levaquin, budesonide nebulizer and DuoNeb nebulizers. 3. Acute on chronic diastolic congestive heart failure- I will give 120 mg IV Lasix stat and 80 mg IV twice a day starting tomorrow. I will hold the patient's Coreg secondary to bradycardia. Holding the hydralazine secondary to relative hypotension. 4. Acute on chronic kidney disease stage IV- I will get a nephrology consultation. Hopefully creatinine will improve with diuresis. 5. Morbid obesity and likely sleep apnea 6. History of hepatitis B and liver disease- on lactulose 7. Chronic back pain on chronic pain medications.  All the records are reviewed and case discussed with ED provider. Management plans discussed with the patient, family and they are in agreement.  CODE STATUS: Full code  TOTAL TIME TAKING CARE OF THIS PATIENT: 55 minutes.    Alford Highland M.D on 05/13/2015 at 4:06 PM  Between 7am to 6pm - Pager - [phone]  After 6pm call admission pager [phone]  [facility]  Office  [phone]  CC: Primary care physician; Verlee Monte, PA-C

## 2015-05-18 ENCOUNTER — Inpatient Hospital Stay: Payer: Self-pay

## 2015-05-18 LAB — BASIC METABOLIC PANEL
ANION GAP: 8 (ref 5–15)
ANION GAP: 8 (ref 5–15)
Anion gap: 9 (ref 5–15)
BUN: 80 mg/dL — ABNORMAL HIGH (ref 6–20)
BUN: 80 mg/dL — ABNORMAL HIGH (ref 6–20)
BUN: 89 mg/dL — AB (ref 6–20)
CALCIUM: 8.3 mg/dL — AB (ref 8.9–10.3)
CHLORIDE: 106 mmol/L (ref 101–111)
CHLORIDE: 103 mmol/L (ref 101–111)
CO2: 23 mmol/L (ref 22–32)
CO2: 23 mmol/L (ref 22–32)
CO2: 22 mmol/L (ref 22–32)
CREATININE: 4.32 mg/dL — AB (ref 0.61–1.24)
Calcium: 8.5 mg/dL — ABNORMAL LOW (ref 8.9–10.3)
Calcium: 8.1 mg/dL — ABNORMAL LOW (ref 8.9–10.3)
Chloride: 105 mmol/L (ref 101–111)
Creatinine, Ser: 4.54 mg/dL — ABNORMAL HIGH (ref 0.61–1.24)
Creatinine, Ser: 4.64 mg/dL — ABNORMAL HIGH (ref 0.61–1.24)
GFR calc Af Amer: 15 mL/min — ABNORMAL LOW (ref >60)
GFR calc non Af Amer: 14 mL/min — ABNORMAL LOW (ref >60)
GFR calc non Af Amer: 13 mL/min — ABNORMAL LOW (ref >60)
GFR, EST AFRICAN AMERICAN: 16 mL/min — AB (ref >60)
GFR, EST AFRICAN AMERICAN: 15 mL/min — AB (ref >60)
GFR, EST NON AFRICAN AMERICAN: 13 mL/min — AB (ref >60)
Glucose, Bld: 243 mg/dL — ABNORMAL HIGH (ref 65–99)
Glucose, Bld: 216 mg/dL — ABNORMAL HIGH (ref 65–99)
Glucose, Bld: 268 mg/dL — ABNORMAL HIGH (ref 65–99)
POTASSIUM: 5.5 mmol/L — AB (ref 3.5–5.1)
POTASSIUM: 5.4 mmol/L — AB (ref 3.5–5.1)
Potassium: 5.4 mmol/L — ABNORMAL HIGH (ref 3.5–5.1)
SODIUM: 137 mmol/L (ref 135–145)
SODIUM: 136 mmol/L (ref 135–145)
SODIUM: 134 mmol/L — AB (ref 135–145)

## 2015-05-18 LAB — CBC
HCT: 31.5 % — ABNORMAL LOW (ref 40.0–52.0)
HEMOGLOBIN: 9.7 g/dL — AB (ref 13.0–18.0)
MCH: 26.5 pg (ref 26.0–34.0)
MCHC: 30.9 g/dL — ABNORMAL LOW (ref 32.0–36.0)
MCV: 85.7 fL (ref 80.0–100.0)
Platelets: 68 10*3/uL — ABNORMAL LOW (ref 150–440)
RBC: 3.68 MIL/uL — AB (ref 4.40–5.90)
RDW: 20.7 % — ABNORMAL HIGH (ref 11.5–14.5)
WBC: 3.6 10*3/uL — AB (ref 3.8–10.6)

## 2015-05-18 MED ORDER — FLUTICASONE PROPIONATE 50 MCG/ACT NA SUSP
2.0000 | Freq: Every day | NASAL | Status: DC
  Administered 2015-05-18 – 2015-05-26 (×7): 2 via NASAL
  Filled 2015-05-18: qty 16

## 2015-05-18 MED ORDER — SODIUM POLYSTYRENE SULFONATE 15 GM/60ML PO SUSP
30.0000 g | Freq: Once | ORAL | Status: AC
  Administered 2015-05-18: 30 g via ORAL
  Filled 2015-05-18: qty 120

## 2015-05-18 NOTE — Consult Note (Signed)
Primary Cardiologist: Dr. Adrian Blackwater   Reason for Consultation :CHF    HPI :This is a 36 YOM well known to our practice who presents to hospital c/o worsening SOB and 10 lb weight gain. He was seen this past week in our office c/p CP. He states he has been compliant with all current medications. Today he is feeling better after receiving agressive diuresis last night. Noted to be bradycardic in ER. No current CP.         Review of Systems: General: negative for chills, fever, night sweats. Positive for 10lb weight gain Cardiovascular: negative for chest pain, edema, orthopnea, palpitations, paroxysmal nocturnal dyspnea. Positive for shortness of breath and dyspnea on exertion Dermatological: negative for rash Respiratory: negative for cough or wheezing Urologic: negative for hematuria Abdominal: negative for nausea, vomiting, diarrhea, bright red blood per rectum, melena, or hematemesis Neurologic: negative for visual changes, syncope, or dizziness All other systems reviewed and are otherwise negative except as noted above.    Past Medical History  Diagnosis Date  . Hypertension   . COPD (chronic obstructive pulmonary disease)   . Chronic kidney disease   . Chronic back pain greater than 3 months duration   . Phlebitis 1992    left calf  . Anxiety and depression   . CHF (congestive heart failure)   . Hepatitis B     Medications Prior to Admission  Medication Sig Dispense Refill  . albuterol (PROVENTIL HFA;VENTOLIN HFA) 108 (90 BASE) MCG/ACT inhaler Inhale 2 puffs into the lungs every 6 (six) hours as needed for wheezing or shortness of breath.    Marland Kitchen amLODipine (NORVASC) 10 MG tablet Take 1 tablet (10 mg total) by mouth daily. 30 tablet 2  . aspirin EC 81 MG tablet Take 81 mg by mouth daily.    Marland Kitchen atorvastatin (LIPITOR) 20 MG tablet Take 20 mg by mouth at bedtime.     . carvedilol (COREG) 12.5 MG tablet Take 12.5 mg by mouth 2 (two) times daily.    . clopidogrel  (PLAVIX) 75 MG tablet Take 75 mg by mouth daily.    . Fluticasone-Salmeterol (ADVAIR DISKUS) 250-50 MCG/DOSE AEPB Inhale 1 puff into the lungs 2 (two) times daily. 60 each 2  . gabapentin (NEURONTIN) 300 MG capsule Take 300 mg by mouth 3 (three) times daily.    . hydrALAZINE (APRESOLINE) 25 MG tablet Take 1 tablet (25 mg total) by mouth every 8 (eight) hours. 90 tablet 2  . HYDROcodone-acetaminophen (NORCO/VICODIN) 5-325 MG per tablet Take 1 tablet by mouth every 6 (six) hours as needed for moderate pain. 25 tablet 0  . isosorbide mononitrate (IMDUR) 30 MG 24 hr tablet Take 30 mg by mouth daily.    Marland Kitchen lactulose (CHRONULAC) 10 GM/15ML solution Take 45 mLs (30 g total) by mouth 2 (two) times daily. 240 mL 1  . nitroGLYCERIN (NITROSTAT) 0.4 MG SL tablet Place 1 tablet (0.4 mg total) under the tongue every 5 (five) minutes as needed for chest pain. 20 tablet 0  . pantoprazole (PROTONIX) 40 MG tablet Take 40 mg by mouth at bedtime.     . potassium chloride SA (K-DUR,KLOR-CON) 20 MEQ tablet Take 1 tablet (20 mEq total) by mouth 2 (two) times daily. 60 tablet 0  . spironolactone (ALDACTONE) 25 MG tablet Take 25 mg by mouth daily.    Marland Kitchen tiotropium (SPIRIVA) 18 MCG inhalation capsule Place 18 mcg into inhaler and inhale daily.    Marland Kitchen torsemide (DEMADEX) 20 MG tablet  Take 2 tablets (40 mg total) by mouth 2 (two) times daily. 120 tablet 0  . predniSONE (STERAPRED UNI-PAK 21 TAB) 10 MG (21) TBPK tablet Take 1 tablet (10 mg total) by mouth daily. 6 tabs PO x 1 day 5 tabs PO x 1 day 4 tabs PO x 1 day 3 tabs PO x 1 day 2 tabs PO x 1 day 1 tab PO x 1 day and stop (Patient not taking: Reported on 2015/06/05) 21 tablet 0     . amLODipine  10 mg Oral Daily  . aspirin EC  81 mg Oral Daily  . atorvastatin  20 mg Oral QHS  . budesonide (PULMICORT) nebulizer solution  0.25 mg Nebulization BID  . clopidogrel  75 mg Oral Daily  . furosemide  80 mg Intravenous BID  . gabapentin  300 mg Oral TID  . heparin  5,000 Units  Subcutaneous 3 times per day  . ipratropium-albuterol  3 mL Nebulization Q6H  . isosorbide mononitrate  30 mg Oral Daily  . lactulose  30 g Oral BID  . levofloxacin (LEVAQUIN) IV  250 mg Intravenous Q24H  . methylPREDNISolone (SOLU-MEDROL) injection  60 mg Intravenous Q8H  . pantoprazole  40 mg Oral QHS  . tiotropium  18 mcg Inhalation Daily    Infusions:    Allergies  Allergen Reactions  . Morphine And Related Other (See Comments)    Reaction:  Headaches     Social History   Social History  . Marital Status: Single    Spouse Name: N/A  . Number of Children: N/A  . Years of Education: N/A   Occupational History  . Not on file.   Social History Main Topics  . Smoking status: Former Smoker -- 0.25 packs/day for 20 years    Types: Cigarettes    Quit date: 12/13/2014  . Smokeless tobacco: Never Used  . Alcohol Use: No  . Drug Use: No  . Sexual Activity: Not on file   Other Topics Concern  . Not on file   Social History Narrative    Family History  Problem Relation Age of Onset  . Stroke Mother   . Cancer Mother     Breast    PHYSICAL EXAM: Filed Vitals:   05/18/15 0431  BP: 114/56  Pulse: 51  Temp: 97.7 F (36.5 C)  Resp: 20     Intake/Output Summary (Last 24 hours) at 05/18/15 1038 Last data filed at 05/18/15 0630  Gross per 24 hour  Intake    240 ml  Output    550 ml  Net   -310 ml    General: Obese, disheveled  HEENT: normal Neck: supple. no JVD. Carotids 2+ bilat; no bruits. No lymphadenopathy or thryomegaly appreciated. Cor: PMI nondisplaced. Regular rate & rhythm. No rubs, gallops or murmurs. Lungs: decreased breath sounds b/l. Mild wheezes  Abdomen: soft, nontender, nondistended. No hepatosplenomegaly. No bruits or masses. Good bowel sounds. Extremities: no cyanosis, clubbing, rash. 2+ pedal edema b/l Neuro: alert & oriented x 3, cranial nerves grossly intact. moves all 4 extremities w/o difficulty.  ECG: sinus bradycardia 41 BPM NS  ST/T changes  Results for orders placed or performed during the hospital encounter of 2015-06-05 (from the past 24 hour(s))  CBC     Status: Abnormal   Collection Time: Jun 05, 2015  2:02 PM  Result Value Ref Range   WBC 4.5 3.8 - 10.6 K/uL   RBC 3.69 (L) 4.40 - 5.90 MIL/uL   Hemoglobin 9.9 (L) 13.0 -  18.0 g/dL   HCT 16.1 (L) 09.6 - 04.5 %   MCV 85.4 80.0 - 100.0 fL   MCH 26.8 26.0 - 34.0 pg   MCHC 31.4 (L) 32.0 - 36.0 g/dL   RDW 40.9 (H) 81.1 - 91.4 %   Platelets 67 (L) 150 - 440 K/uL  Troponin I     Status: None   Collection Time: 05/11/2015  2:02 PM  Result Value Ref Range   Troponin I 0.03 <0.031 ng/mL  Comprehensive metabolic panel     Status: Abnormal   Collection Time: 04/27/2015  2:02 PM  Result Value Ref Range   Sodium 135 135 - 145 mmol/L   Potassium 5.0 3.5 - 5.1 mmol/L   Chloride 105 101 - 111 mmol/L   CO2 22 22 - 32 mmol/L   Glucose, Bld 181 (H) 65 - 99 mg/dL   BUN 76 (H) 6 - 20 mg/dL   Creatinine, Ser 7.82 (H) 0.61 - 1.24 mg/dL   Calcium 8.0 (L) 8.9 - 10.3 mg/dL   Total Protein 6.2 (L) 6.5 - 8.1 g/dL   Albumin 3.0 (L) 3.5 - 5.0 g/dL   AST 14 (L) 15 - 41 U/L   ALT 15 (L) 17 - 63 U/L   Alkaline Phosphatase 62 38 - 126 U/L   Total Bilirubin 0.8 0.3 - 1.2 mg/dL   GFR calc non Af Amer 15 (L) >60 mL/min   GFR calc Af Amer 17 (L) >60 mL/min   Anion gap 8 5 - 15  Brain natriuretic peptide     Status: Abnormal   Collection Time: 05/05/2015  2:02 PM  Result Value Ref Range   B Natriuretic Peptide 1343.0 (H) 0.0 - 100.0 pg/mL  Basic metabolic panel     Status: Abnormal   Collection Time: 05/18/15  5:14 AM  Result Value Ref Range   Sodium 137 135 - 145 mmol/L   Potassium 5.4 (H) 3.5 - 5.1 mmol/L   Chloride 106 101 - 111 mmol/L   CO2 23 22 - 32 mmol/L   Glucose, Bld 243 (H) 65 - 99 mg/dL   BUN 80 (H) 6 - 20 mg/dL   Creatinine, Ser 9.56 (H) 0.61 - 1.24 mg/dL   Calcium 8.3 (L) 8.9 - 10.3 mg/dL   GFR calc non Af Amer 14 (L) >60 mL/min   GFR calc Af Amer 16 (L) >60 mL/min    Anion gap 8 5 - 15  CBC     Status: Abnormal   Collection Time: 05/18/15  5:14 AM  Result Value Ref Range   WBC 3.6 (L) 3.8 - 10.6 K/uL   RBC 3.68 (L) 4.40 - 5.90 MIL/uL   Hemoglobin 9.7 (L) 13.0 - 18.0 g/dL   HCT 21.3 (L) 08.6 - 57.8 %   MCV 85.7 80.0 - 100.0 fL   MCH 26.5 26.0 - 34.0 pg   MCHC 30.9 (L) 32.0 - 36.0 g/dL   RDW 46.9 (H) 62.9 - 52.8 %   Platelets 68 (L) 150 - 440 K/uL   Dg Chest Port 1 View  05/13/2015   CLINICAL DATA:  Short of breath  EXAM: PORTABLE CHEST 1 VIEW  COMPARISON:  05/01/2015  FINDINGS: The heart remains severely enlarged. Airspace disease at the right base has improved. Airspace disease in the medial right upper lung zone has worsened. Opacity at the left base persists. No pneumothorax.  IMPRESSION: Stable severe cardiomegaly  Bilateral airspace disease is improved at the right base, stable at the left base but  worse in the right upper lobe.   Electronically Signed   By: Jolaine Click M.D.   On: 05/30/2015 14:36     ASSESSMENT: Acute on chronic diastolic CHF  PLAN/DISCUSSION: Agree with continuation of diuresis and holding Bb 2/2 bradycardia. HR already improved. Continue all other home medications.    Patient and plan discussed with supervising provider, Dr. Adrian Blackwater, who agrees with above findings.   Gary Juarez Gary Juarez [facility] 05/18/2015 10:38 AM

## 2015-05-18 NOTE — Care Management Note (Addendum)
Case Management Note  Patient Details  Name: Gary Juarez MRN: [id number] Date of Birth: April 29, 1961  Subjective/Objective:  Verified with [facility] that [name] is a current open client of [facility] for RN and PT. [facility] also provides [name] home oxygen at 2L N/C. This Clinical research associate provided [name] with a generic drug coupon, and applications to [facility] during his last hospitalization on 05/05/15. [name] lives alone but his father provides transportation to appointments. Cardiologist=Dr Adrian Blackwater. Is followed by the [facility]. Uninsured and Case Management will follow for discharge planning.                    Action/Plan:   Expected Discharge Date:                  Expected Discharge Plan:     In-House Referral:     Discharge planning Services     Post Acute Care Choice:    Choice offered to:     DME Arranged:    DME Agency:     HH Arranged:    HH Agency:     Status of Service:     Medicare Important Message Given:    Date Medicare IM Given:    Medicare IM give by:    Date Additional Medicare IM Given:    Additional Medicare Important Message give by:     If discussed at Long Length of Stay Meetings, dates discussed:    Additional Comments:  Rockett,Marilyn A, RN 05/18/2015, 3:43 PM

## 2015-05-18 NOTE — Progress Notes (Signed)
[facility] - Inpatient Physicians - [place] at [facility]   PATIENT NAME: Gary Juarez    MR#:  [id number]  DATE OF BIRTH:  06-16-1961  SUBJECTIVE:  CHIEF COMPLAINT:   Chief Complaint  Patient presents with  . Shortness of Breath   still cough and shortness of breath  REVIEW OF SYSTEMS:  CONSTITUTIONAL: No fever, generalized weakness.  EYES: No blurred or double vision.  EARS, NOSE, AND THROAT: No tinnitus or ear pain.  RESPIRATORY: Has cough, shortness of breath, no wheezing or hemoptysis.  CARDIOVASCULAR: No chest pain, orthopnea, but has leg edema.  GASTROINTESTINAL: No nausea, vomiting, diarrhea or abdominal pain.  GENITOURINARY: No dysuria, hematuria.  ENDOCRINE: No polyuria, nocturia,  HEMATOLOGY: No anemia, easy bruising or bleeding SKIN: No rash or lesion. MUSCULOSKELETAL: No joint pain or arthritis.   NEUROLOGIC: No tingling, numbness, weakness.  PSYCHIATRY: No anxiety or depression.   DRUG ALLERGIES:   Allergies  Allergen Reactions  . Morphine And Related Other (See Comments)    Reaction:  Headaches     VITALS:  Blood pressure 137/63, pulse 55, temperature 97.7 F (36.5 C), temperature source Oral, resp. rate 20, height  (1.702 m), weight 174.317 kg (384 lb 4.8 oz), SpO2 92 %.  PHYSICAL EXAMINATION:  GENERAL:  54 y.o.-year-old patient lying in the bed with no acute distress. Morbidly obese. EYES: Pupils equal, round, reactive to light and accommodation. No scleral icterus. Extraocular muscles intact.  HEENT: Head atraumatic, normocephalic. Oropharynx and nasopharynx clear. Moist oral mucosa. NECK:  Supple, no jugular venous distention. No thyroid enlargement, no tenderness.  LUNGS: Normal breath sounds bilaterally, no wheezing, mild rales. No use of accessory muscles of respiration.  CARDIOVASCULAR: S1, S2 normal. No murmurs, rubs, or gallops.  ABDOMEN: Soft, nontender, nondistended. Bowel sounds present. No organomegaly or mass.  EXTREMITIES:  Bilateral lower extremity edema 2-3+, no cyanosis, or clubbing.  NEUROLOGIC: Cranial nerves II through XII are intact. Muscle strength 5/5 in all extremities. Sensation intact. Gait not checked.  PSYCHIATRIC: The patient is alert and oriented x 3.  SKIN: No obvious rash, lesion, or ulcer.    LABORATORY PANEL:   CBC  Recent Labs Lab 05/18/15 0514  WBC 3.6*  HGB 9.7*  HCT 31.5*  PLT 68*   ------------------------------------------------------------------------------------------------------------------  Chemistries   Recent Labs Lab 2015/06/04 1402  05/18/15 1146  NA 135  < > 136  K 5.0  < > 5.5*  CL 105  < > 105  CO2 22  < > 23  GLUCOSE 181*  < > 216*  BUN 76*  < > 80*  CREATININE 4.21*  < > 4.54*  CALCIUM 8.0*  < > 8.5*  AST 14*  --   --   ALT 15*  --   --   ALKPHOS 62  --   --   BILITOT 0.8  --   --   < > = values in this interval not displayed. ------------------------------------------------------------------------------------------------------------------  Cardiac Enzymes  Recent Labs Lab 06-04-15 1402  TROPONINI 0.03   ------------------------------------------------------------------------------------------------------------------  RADIOLOGY:  US Renal  05/18/2015   CLINICAL DATA:  Acute renal failure.  EXAM: RENAL / URINARY TRACT ULTRASOUND COMPLETE  COMPARISON:  Renal ultrasound 05/03/2015  FINDINGS: Right Kidney:  Length: 10.7 cm. No hydronephrosis. Diffusely increased renal cortical echogenicity.  Left Kidney:  Length: 7.8 cm. No hydronephrosis. Diffusely increased renal cortical echogenicity.  Bladder:  Not fully distended  IMPRESSION: Diffusely increased renal cortical echogenicity as can be seen with chronic medical renal disease.  No hydronephrosis.   Electronically Signed   By: Annia Belt M.D.   On: 05/18/2015 14:21   Dg Chest Port 1 View  05/01/2015   CLINICAL DATA:  Short of breath  EXAM: PORTABLE CHEST 1 VIEW  COMPARISON:  05/01/2015  FINDINGS:  The heart remains severely enlarged. Airspace disease at the right base has improved. Airspace disease in the medial right upper lung zone has worsened. Opacity at the left base persists. No pneumothorax.  IMPRESSION: Stable severe cardiomegaly  Bilateral airspace disease is improved at the right base, stable at the left base but worse in the right upper lobe.   Electronically Signed   By: Jolaine Click M.D.   On: 05/24/2015 14:36    EKG:   Orders placed or performed during the hospital encounter of 05/06/2015  . ED EKG  . ED EKG    ASSESSMENT AND PLAN:   1. Acute on chronic respiratory failure- continue oxygen of 2 L nasal cannula.  2. COPD exacerbation. Continue IV Solu-Medrol, Levaquin, budesonide nebulizer, Spiriva and DuoNeb nebulizers.  3. Acute on chronic diastolic congestive heart failure. Lasix stat and 80 mg IV twice a day, hold the patient's Coreg secondary to bradycardia. Holding the hydralazine and norvasc secondary to relative hypotension.   4. Acute on chronic kidney disease stage IV. Continue Lasix, follow-up renal ultrasound per Dr. Cherylann Ratel.  5. Morbid obesity and likely sleep apnea 6. History of hepatitis B and liver disease- on lactulose 7. Chronic back pain on chronic pain medications.  Hyperkalemia. Hold spironolactone, give Kayexalate and follow-up potassium level.   All the records are reviewed and case discussed with Care Management/Social Workerr. Management plans discussed with the patient, family and they are in agreement.  CODE STATUS: Full code  TOTAL TIME TAKING CARE OF THIS PATIENT: 45 minutes.   POSSIBLE D/C IN 3 DAYS, DEPENDING ON CLINICAL CONDITION.   Shaune Pollack M.D on 05/18/2015 at 2:53 PM  Between 7am to 6pm - Pager - [phone]  After 6pm go to [email or web address] - password [email or web address] [facility]  [facility]  Office  [phone]  CC: Primary care physician; Verlee Monte, PA-C

## 2015-05-18 NOTE — Progress Notes (Signed)
[facility]  ROUNDING NOTE   Subjective:  Pt well known to [place] from admission earlier this month. Presents now again with progressive shortness of breath and edema. Significant weight gain noted.  Home health nurse was by prior to admission and pt states he got quite short of breath by just walking to the door. Pt is on torsemide currently.    Objective:  Vital signs in last 24 hours:  Temp:  [97.4 F (36.3 C)-98.2 F (36.8 C)] 97.7 F (36.5 C) (09/24 0431) Pulse Rate:  [42-55] 55 (09/24 0745) Resp:  [14-20] 20 (09/24 0745) BP: (101-137)/(53-66) 137/63 mmHg (09/24 0745) SpO2:  [90 %-97 %] 92 % (09/24 0745) Weight:  [174.317 kg (384 lb 4.8 oz)-178.201 kg (392 lb 13.8 oz)] 174.317 kg (384 lb 4.8 oz) (09/23 1742)  Weight change:  Filed Weights   05/16/2015 1359 05/19/2015 1742  Weight: 178.201 kg (392 lb 13.8 oz) 174.317 kg (384 lb 4.8 oz)    Intake/Output: I/O last 3 completed shifts: In: 240 [P.O.:240] Out: 550 [Urine:550]   Intake/Output this shift:     Physical Exam: General: morbidly obese NAD  Head: Normocephalic, atraumatic. Moist oral mucosal membranes  Eyes: Anicteric  Neck: Supple, trachea midline  Lungs:  Bilateral rales normal effort  Heart: S1S2 no rubs  Abdomen:  Soft, nontender, obese, lower abdominal wall edema noted  Extremities: 3+ b/l LE edema  Neurologic: Awake, alert, following commands  Skin: No lesions       Basic Metabolic Panel:  Recent Labs Lab 05/03/2015 1402 05/18/15 0514  NA 135 137  K 5.0 5.4*  CL 105 106  CO2 22 23  GLUCOSE 181* 243*  BUN 76* 80*  CREATININE 4.21* 4.32*  CALCIUM 8.0* 8.3*    Liver Function Tests:  Recent Labs Lab 04/29/2015 1402  AST 14*  ALT 15*  ALKPHOS 62  BILITOT 0.8  PROT 6.2*  ALBUMIN 3.0*   No results for input(s): LIPASE, AMYLASE in the last 168 hours. No results for input(s): AMMONIA in the last 168 hours.  CBC:  Recent Labs Lab 05/18/2015 1402 05/18/15 0514  WBC 4.5 3.6*   HGB 9.9* 9.7*  HCT 31.5* 31.5*  MCV 85.4 85.7  PLT 67* 68*    Cardiac Enzymes:  Recent Labs Lab 05/02/2015 1402  TROPONINI 0.03    BNP: Invalid input(s): POCBNP  CBG: No results for input(s): GLUCAP in the last 168 hours.  Microbiology: Results for orders placed or performed during the hospital encounter of 05/01/15  MRSA PCR Screening     Status: None   Collection Time: 05/01/15  9:42 PM  Result Value Ref Range Status   MRSA by PCR NEGATIVE NEGATIVE Final    Comment:        The GeneXpert MRSA Assay (FDA approved for NASAL specimens only), is one component of a comprehensive MRSA colonization surveillance program. It is not intended to diagnose MRSA infection nor to guide or monitor treatment for MRSA infections.     Coagulation Studies: No results for input(s): LABPROT, INR in the last 72 hours.  Urinalysis: No results for input(s): COLORURINE, LABSPEC, PHURINE, GLUCOSEU, HGBUR, BILIRUBINUR, KETONESUR, PROTEINUR, UROBILINOGEN, NITRITE, LEUKOCYTESUR in the last 72 hours.  Invalid input(s): APPERANCEUR    Imaging: Dg Chest Port 1 View  05/14/2015   CLINICAL DATA:  Short of breath  EXAM: PORTABLE CHEST 1 VIEW  COMPARISON:  05/01/2015  FINDINGS: The heart remains severely enlarged. Airspace disease at the right base has improved. Airspace disease in  the medial right upper lung zone has worsened. Opacity at the left base persists. No pneumothorax.  IMPRESSION: Stable severe cardiomegaly  Bilateral airspace disease is improved at the right base, stable at the left base but worse in the right upper lobe.   Electronically Signed   By: Jolaine Click M.D.   On: 04/25/2015 14:36     Medications:     . amLODipine  10 mg Oral Daily  . aspirin EC  81 mg Oral Daily  . atorvastatin  20 mg Oral QHS  . budesonide (PULMICORT) nebulizer solution  0.25 mg Nebulization BID  . clopidogrel  75 mg Oral Daily  . fluticasone  2 spray Each Nare Daily  . furosemide  80 mg  Intravenous BID  . gabapentin  300 mg Oral TID  . heparin  5,000 Units Subcutaneous 3 times per day  . ipratropium-albuterol  3 mL Nebulization Q6H  . isosorbide mononitrate  30 mg Oral Daily  . lactulose  30 g Oral BID  . levofloxacin (LEVAQUIN) IV  250 mg Intravenous Q24H  . methylPREDNISolone (SOLU-MEDROL) injection  60 mg Intravenous Q8H  . pantoprazole  40 mg Oral QHS  . tiotropium  18 mcg Inhalation Daily   albuterol, HYDROcodone-acetaminophen, nitroGLYCERIN  Assessment/ Plan:  [name] is a 54 y.o.  white male with hepatits B, hypertension, COPD, anemia, thrombocytopenia, morbid obesity, tobacco abuse.  1. Acute Renal Failure on chronic kidney disease stage IV with proteinuria:  Concern for progression of disease. Creatinine baseline of 3.5 from 9/16.  Acute renal failure due to acute exacerbation of diastolic heart failure. - Pt appears to have proteinuria and hematuria.  Will obtain full serologic work up again.  Pt appears to have progressive renal disease.  Will also check renal US.  Renal function has the potential to get worse with additional diuresis but he clearly appears to be in need of it.  May need to consider dialysis for treatment if renal function continues to deteriorate.    2. Acute diastolic heart failure:  Significant weight gain noted in a short period of time, continue lasix  IV BID for now.  Will need to monitor renal function closely while doing this.     LOS: 1 Gary Juarez, Gary Juarez 9/24/201611:55 AM

## 2015-05-19 LAB — BASIC METABOLIC PANEL
ANION GAP: 9 (ref 5–15)
BUN: 89 mg/dL — ABNORMAL HIGH (ref 6–20)
CALCIUM: 8.2 mg/dL — AB (ref 8.9–10.3)
CO2: 23 mmol/L (ref 22–32)
Chloride: 102 mmol/L (ref 101–111)
Creatinine, Ser: 4.89 mg/dL — ABNORMAL HIGH (ref 0.61–1.24)
GFR, EST AFRICAN AMERICAN: 14 mL/min — AB (ref >60)
GFR, EST NON AFRICAN AMERICAN: 12 mL/min — AB (ref >60)
Glucose, Bld: 212 mg/dL — ABNORMAL HIGH (ref 65–99)
Potassium: 5.2 mmol/L — ABNORMAL HIGH (ref 3.5–5.1)
Sodium: 134 mmol/L — ABNORMAL LOW (ref 135–145)

## 2015-05-19 LAB — PHOSPHORUS: Phosphorus: 8.3 mg/dL — ABNORMAL HIGH (ref 2.5–4.6)

## 2015-05-19 MED ORDER — LEVOFLOXACIN 250 MG PO TABS: 250.0000 mg | ORAL_TABLET | ORAL | Status: DC

## 2015-05-19 MED ORDER — SODIUM CHLORIDE 0.9 % IV SOLN: 100.0000 mL | INTRAVENOUS | Status: DC | PRN

## 2015-05-19 MED ORDER — LIDOCAINE HCL (PF) 1 % IJ SOLN
5.0000 mL | INTRAMUSCULAR | Status: DC | PRN
  Filled 2015-05-19: qty 5

## 2015-05-19 MED ORDER — LIDOCAINE-PRILOCAINE 2.5-2.5 % EX CREA: 1.0000 "application " | TOPICAL_CREAM | CUTANEOUS | Status: DC | PRN

## 2015-05-19 MED ORDER — HEPARIN SODIUM (PORCINE) 1000 UNIT/ML IJ SOLN
1000.0000 [IU] | INTRAMUSCULAR | Status: DC | PRN
  Administered 2015-05-19 – 2015-05-20 (×2): 2600 [IU]
  Filled 2015-05-19 (×5): qty 1

## 2015-05-19 MED ORDER — METHYLPREDNISOLONE SODIUM SUCC 125 MG IJ SOLR
60.0000 mg | Freq: Two times a day (BID) | INTRAMUSCULAR | Status: DC
  Administered 2015-05-19: 60 mg via INTRAVENOUS
  Filled 2015-05-19 (×2): qty 2

## 2015-05-19 MED ORDER — ALTEPLASE 2 MG IJ SOLR: 2.0000 mg | Freq: Once | INTRAMUSCULAR | Status: DC | PRN

## 2015-05-19 MED ORDER — SODIUM CHLORIDE 0.9 % IJ SOLN: 3.0000 mL | INTRAMUSCULAR | Status: DC | PRN

## 2015-05-19 MED ORDER — SODIUM CHLORIDE 0.9 % IJ SOLN
3.0000 mL | Freq: Two times a day (BID) | INTRAMUSCULAR | Status: DC
  Administered 2015-05-19 – 2015-05-21 (×7): 3 mL via INTRAVENOUS

## 2015-05-19 MED ORDER — PENTAFLUOROPROP-TETRAFLUOROETH EX AERO: 1.0000 "application " | INHALATION_SPRAY | CUTANEOUS | Status: DC | PRN

## 2015-05-19 MED ORDER — IPRATROPIUM-ALBUTEROL 0.5-2.5 (3) MG/3ML IN SOLN
3.0000 mL | Freq: Four times a day (QID) | RESPIRATORY_TRACT | Status: DC
Start: 2015-05-20 — End: 2015-05-20
  Administered 2015-05-20 (×2): 3 mL via RESPIRATORY_TRACT
  Filled 2015-05-19 (×2): qty 3

## 2015-05-19 NOTE — Progress Notes (Signed)
[facility]  ROUNDING NOTE   Subjective:  Renal function worse, Cr up to 4.89, egfr only 12. Massive edema noted.  Pt incontinent of urine, however 700 recorded.   Pt also with hyperkalemia. Doesn't appear pt has had good response to diuretic therapy. Talked in depth with pt about dialysis.   Objective:  Vital signs in last 24 hours:  Temp:  [97.9 F (36.6 C)-98.3 F (36.8 C)] 98.3 F (36.8 C) (09/25 0509) Pulse Rate:  [49-55] 55 (09/25 0730) Resp:  [20-22] 22 (09/25 0730) BP: (108-142)/(54-100) 138/100 mmHg (09/25 0730) SpO2:  [91 %-95 %] 93 % (09/25 0730)  Weight change:  Filed Weights   05/01/2015 1359 05/12/2015 1742  Weight: 178.201 kg (392 lb 13.8 oz) 174.317 kg (384 lb 4.8 oz)    Intake/Output: I/O last 3 completed shifts: In: 1200 [P.O.:1200] Out: 700 [Urine:700]   Intake/Output this shift:     Physical Exam: General: morbidly obese NAD  Head: Normocephalic, atraumatic. Moist oral mucosal membranes  Eyes: Anicteric  Neck: Supple, trachea midline  Lungs:  Bilateral rales normal effort  Heart: S1S2 no rubs  Abdomen:  Soft, nontender, obese, lower abdominal wall edema noted  Extremities: 3+ b/l LE edema  Neurologic: Awake, alert, following commands  Skin: No lesions       Basic Metabolic Panel:  Recent Labs Lab 05/13/2015 1402 05/18/15 0514 05/18/15 1146 05/18/15 2048 05/19/15 0442  NA 135 137 136 134* 134*  K 5.0 5.4* 5.5* 5.4* 5.2*  CL 105 106 105 103 102  CO2 22 23 23 22 23   GLUCOSE 181* 243* 216* 268* 212*  BUN 76* 80* 80* 89* 89*  CREATININE 4.21* 4.32* 4.54* 4.64* 4.89*  CALCIUM 8.0* 8.3* 8.5* 8.1* 8.2*    Liver Function Tests:  Recent Labs Lab 05/08/2015 1402  AST 14*  ALT 15*  ALKPHOS 62  BILITOT 0.8  PROT 6.2*  ALBUMIN 3.0*   No results for input(s): LIPASE, AMYLASE in the last 168 hours. No results for input(s): AMMONIA in the last 168 hours.  CBC:  Recent Labs Lab 05/15/2015 1402 05/18/15 0514  WBC 4.5 3.6*   HGB 9.9* 9.7*  HCT 31.5* 31.5*  MCV 85.4 85.7  PLT 67* 68*    Cardiac Enzymes:  Recent Labs Lab 05/08/2015 1402  TROPONINI 0.03    BNP: Invalid input(s): POCBNP  CBG: No results for input(s): GLUCAP in the last 168 hours.  Microbiology: Results for orders placed or performed during the hospital encounter of 05/09/2015  Culture, blood (routine x 2)     Status: None (Preliminary result)   Collection Time: 05/20/2015  4:10 PM  Result Value Ref Range Status   Specimen Description BLOOD RIGHT AC  Final   Special Requests BOTTLES DRAWN AEROBIC AND ANAEROBIC  [place]  Final   Culture NO GROWTH 2 DAYS  Final   Report Status PENDING  Incomplete  Culture, blood (routine x 2)     Status: None (Preliminary result)   Collection Time: 05/14/2015  4:18 PM  Result Value Ref Range Status   Specimen Description BLOOD LEFT HAND  Final   Special Requests BOTTLES DRAWN AEROBIC AND ANAEROBIC  4CC  Final   Culture NO GROWTH 2 DAYS  Final   Report Status PENDING  Incomplete    Coagulation Studies: No results for input(s): LABPROT, INR in the last 72 hours.  Urinalysis: No results for input(s): COLORURINE, LABSPEC, PHURINE, GLUCOSEU, HGBUR, BILIRUBINUR, KETONESUR, PROTEINUR, UROBILINOGEN, NITRITE, LEUKOCYTESUR in the last 72 hours.  Invalid input(s): APPERANCEUR    Imaging: US Renal  05/18/2015   CLINICAL DATA:  Acute renal failure.  EXAM: RENAL / URINARY TRACT ULTRASOUND COMPLETE  COMPARISON:  Renal ultrasound 05/03/2015  FINDINGS: Right Kidney:  Length: 10.7 cm. No hydronephrosis. Diffusely increased renal cortical echogenicity.  Left Kidney:  Length: 7.8 cm. No hydronephrosis. Diffusely increased renal cortical echogenicity.  Bladder:  Not fully distended  IMPRESSION: Diffusely increased renal cortical echogenicity as can be seen with chronic medical renal disease.  No hydronephrosis.   Electronically Signed   By: Lovey Newcomer M.D.   On: 05/18/2015 14:21   Dg Chest Port 1 View  04/30/2015    CLINICAL DATA:  Short of breath  EXAM: PORTABLE CHEST 1 VIEW  COMPARISON:  05/01/2015  FINDINGS: The heart remains severely enlarged. Airspace disease at the right base has improved. Airspace disease in the medial right upper lung zone has worsened. Opacity at the left base persists. No pneumothorax.  IMPRESSION: Stable severe cardiomegaly  Bilateral airspace disease is improved at the right base, stable at the left base but worse in the right upper lobe.   Electronically Signed   By: Marybelle Killings M.D.   On: 04/29/2015 14:36     Medications:     . aspirin EC  81 mg Oral Daily  . atorvastatin  20 mg Oral QHS  . budesonide (PULMICORT) nebulizer solution  0.25 mg Nebulization BID  . clopidogrel  75 mg Oral Daily  . fluticasone  2 spray Each Nare Daily  . furosemide  80 mg Intravenous BID  . gabapentin  300 mg Oral TID  . heparin  5,000 Units Subcutaneous 3 times per day  . ipratropium-albuterol  3 mL Nebulization Q6H  . isosorbide mononitrate  30 mg Oral Daily  . lactulose  30 g Oral BID  . levofloxacin  250 mg Oral Q24H  . methylPREDNISolone (SOLU-MEDROL) injection  60 mg Intravenous Q8H  . pantoprazole  40 mg Oral QHS  . sodium chloride  3 mL Intravenous Q12H  . tiotropium  18 mcg Inhalation Daily   albuterol, HYDROcodone-acetaminophen, nitroGLYCERIN, sodium chloride  Assessment/ Plan:  Mr. JENIEL SLAUSON is a 54 y.o.  white male with hepatits B, hypertension, COPD, anemia, thrombocytopenia, morbid obesity, tobacco abuse.  1. 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 Creatinine baseline of 3.5 from 9/16.  Acute renal failure due to acute exacerbation of diastolic heart failure. - Renal function worse, pt also with hyperkalemia and massive volume overload.  Discussed dialysis in depth with pt given these findings.  Would plan for temporary HD as earlier this month Cr was 3.5.  Consulted with vascular surgery  to place temporary HD catheter for now.  Will plan for first HD treatment later today..    2. Acute diastolic heart failure:  Significant weight gain noted in a short period of time, lasix doesn't appear to be fully effective, will opt for HD for now.  3.  Massive generalized edema R60.1:  Significant edema noted, pt likely has at least 40lbs of fluid overload.  Will plan for HD, may need daily HD for a bit.  LOS: 2 LATEEF, MUNSOOR 9/25/20169:57 AM

## 2015-05-19 NOTE — Care Management Note (Signed)
Case Management Note  Patient Details  Name: Gary Juarez MRN: [id number] Date of Birth: [birth year]  Subjective/Objective:    Today creatinine=4.89, BUN=89, K=5.2. Currently receiving 2.5L N/C. Not responding well to diuretics. Today [name] received placement of a right femoral temporary dialysis catheter. Scheduled to receive first HD treatment later today.                 Action/Plan:   Expected Discharge Date:                  Expected Discharge Plan:     In-House Referral:     Discharge planning Services     Post Acute Care Choice:    Choice offered to:     DME Arranged:    DME Agency:     HH Arranged:    HH Agency:     Status of Service:     Medicare Important Message Given:    Date Medicare IM Given:    Medicare IM give by:    Date Additional Medicare IM Given:    Additional Medicare Important Message give by:     If discussed at Long Length of Stay Meetings, dates discussed:    Additional Comments:  Rockett,Marilyn A, RN 05/19/2015, 3:13 PM

## 2015-05-19 NOTE — Care Management Note (Signed)
I will begin to gather records in case patient is to need outpatient dialysis at discharge.  Ivor Reining Dialysis Liaison [phone]

## 2015-05-19 NOTE — Progress Notes (Signed)
[facility] - [place] at [facility]   PATIENT NAME: Gary Juarez    MR#:  [id number]  DATE OF BIRTH:  [birth year]  SUBJECTIVE:  CHIEF COMPLAINT:   Chief Complaint  Patient presents with  . Shortness of Breath  better cough and shortness of breath, on oxygen by nasal cannular 2 L  REVIEW OF SYSTEMS:  CONSTITUTIONAL: No fever, generalized weakness.  EYES: No blurred or double vision.  EARS, NOSE, AND THROAT: No tinnitus or ear pain.  RESPIRATORY: Has cough, shortness of breath, no wheezing or hemoptysis.  CARDIOVASCULAR: No chest pain, orthopnea, but has leg edema.  GASTROINTESTINAL: No nausea, vomiting, diarrhea or abdominal pain.  GENITOURINARY: No dysuria, hematuria.  ENDOCRINE: No polyuria, nocturia,  HEMATOLOGY: No anemia, easy bruising or bleeding SKIN: No rash or lesion. MUSCULOSKELETAL: No joint pain or arthritis.   NEUROLOGIC: No tingling, numbness, weakness.  PSYCHIATRY: No anxiety or depression.   DRUG ALLERGIES:   Allergies  Allergen Reactions  . Morphine And Related Other (See Comments)    Reaction:  Headaches     VITALS:  Blood pressure 138/100, pulse 55, temperature 98.3 F (36.8 C), temperature source Oral, resp. rate 22, height  (1.702 m), weight 174.317 kg (384 lb 4.8 oz), SpO2 93 %.  PHYSICAL EXAMINATION:  GENERAL:  54 y.o.-year-old patient lying in the bed with no acute distress. Morbidly obese. EYES: Pupils equal, round, reactive to light and accommodation. No scleral icterus. Extraocular muscles intact.  HEENT: Head atraumatic, normocephalic. Oropharynx and nasopharynx clear. Moist oral mucosa. NECK:  Supple, no jugular venous distention. No thyroid enlargement, no tenderness.  LUNGS: Normal breath sounds bilaterally, no wheezing, mild rales. No use of accessory muscles of respiration.  CARDIOVASCULAR: S1, S2 normal. No murmurs, rubs, or gallops.  ABDOMEN: Soft, nontender, nondistended. Bowel sounds present. Abdominal  wall edema 1+. EXTREMITIES: Bilateral lower extremity edema 2-3+, no cyanosis, or clubbing.  NEUROLOGIC: Cranial nerves II through XII are intact. Muscle strength 5/5 in all extremities. Sensation intact. Gait not checked.  PSYCHIATRIC: The patient is alert and oriented x 3.  SKIN: No obvious rash, lesion, or ulcer.    LABORATORY PANEL:   CBC  Recent Labs Lab 05/18/15 0514  WBC 3.6*  HGB 9.7*  HCT 31.5*  PLT 68*   ------------------------------------------------------------------------------------------------------------------  Chemistries   Recent Labs Lab 05/22/2015 1402  05/19/15 0442  NA 135  < > 134*  K 5.0  < > 5.2*  CL 105  < > 102  CO2 22  < > 23  GLUCOSE 181*  < > 212*  BUN 76*  < > 89*  CREATININE 4.21*  < > 4.89*  CALCIUM 8.0*  < > 8.2*  AST 14*  --   --   ALT 15*  --   --   ALKPHOS 62  --   --   BILITOT 0.8  --   --   < > = values in this interval not displayed. ------------------------------------------------------------------------------------------------------------------  Cardiac Enzymes  Recent Labs Lab 05/22/2015 1402  TROPONINI 0.03   ------------------------------------------------------------------------------------------------------------------  RADIOLOGY:  US Renal  05/18/2015   CLINICAL DATA:  Acute renal failure.  EXAM: RENAL / URINARY TRACT ULTRASOUND COMPLETE  COMPARISON:  Renal ultrasound 05/03/2015  FINDINGS: Right Kidney:  Length: 10.7 cm. No hydronephrosis. Diffusely increased renal cortical echogenicity.  Left Kidney:  Length: 7.8 cm. No hydronephrosis. Diffusely increased renal cortical echogenicity.  Bladder:  Not fully distended  IMPRESSION: Diffusely increased renal cortical echogenicity as can be seen with  chronic medical renal disease.  No hydronephrosis.   Electronically Signed   By: Annia Belt M.D.   On: 05/18/2015 14:21   Dg Chest Port 1 View  04/30/2015   CLINICAL DATA:  Short of breath  EXAM: PORTABLE CHEST 1 VIEW   COMPARISON:  05/01/2015  FINDINGS: The heart remains severely enlarged. Airspace disease at the right base has improved. Airspace disease in the medial right upper lung zone has worsened. Opacity at the left base persists. No pneumothorax.  IMPRESSION: Stable severe cardiomegaly  Bilateral airspace disease is improved at the right base, stable at the left base but worse in the right upper lobe.   Electronically Signed   By: Jolaine Click M.D.   On: 05/16/2015 14:36    EKG:   Orders placed or performed during the hospital encounter of 05/16/2015  . ED EKG  . ED EKG    ASSESSMENT AND PLAN:   1. Acute on chronic respiratory failure- continue oxygen of 2 L nasal cannula.  2. COPD exacerbation. Taper IV Solu-Medrol, Levaquin, budesonide nebulizer, Spiriva and DuoNeb nebulizers.  3. Acute on chronic diastolic congestive heart failure. On Lasix 80 mg IV twice a day, hold the patient's Coreg secondary to bradycardia. Holding the hydralazine and norvasc secondary to relative hypotension.  Start temporary hemodialysis today per Dr. Cherylann Ratel.  4. Acute on chronic kidney disease stage IV. Start temporary hemodialysis today per Dr. Cherylann Ratel.  5. Morbid obesity and likely sleep apnea 6. History of hepatitis B and liver disease- on lactulose 7. Chronic back pain on chronic pain medications.  Hyperkalemia. Hold spironolactone, given Kayexalate yesterday. K 5.2. Start temporary hemodialysis today per Dr. Cherylann Ratel. Follow up BMP.  Chronic thrombocytopenia. Follow-up CBC.  I discussed with Dr. Cherylann Ratel. All the records are reviewed and case discussed with Care Management/Social Workerr. Management plans discussed with the patient, his father and they are in agreement.  CODE STATUS: Full code  TOTAL TIME TAKING CARE OF THIS PATIENT: 42 minutes.   POSSIBLE D/C IN >3 DAYS, DEPENDING ON CLINICAL CONDITION.   Shaune Pollack M.D on 05/19/2015 at 11:09 AM  Between 7am to 6pm - Pager - [phone]  After 6pm go  to [email or web address] - password [email or web address] [facility]  [facility]  Office  [phone]  CC: Primary care physician; Verlee Monte, PA-C

## 2015-05-19 NOTE — Plan of Care (Signed)
Problem: Phase I Progression Outcomes Goal: EF % per last Echo/documented,Core Reminder form on chart Outcome: Completed/Met Date Met:  05/19/15 60-65%

## 2015-05-19 NOTE — Progress Notes (Signed)
IV to PO:    54 yo male on Levaquin  IV Q24H.    Patient is taking other meds by mouth, is eating >50% of meals, is afebrile and has WBC <11.1.    Will transition to Levaquin  PO Q24H as per policy.  [place],Christine K 05/19/2015

## 2015-05-19 NOTE — Consult Note (Signed)
Vascular Surgery  Reason for Consult:CKD IV/V requiring temporary HD access Referring Physician: Dr. Berkley Juarez is an 54 y.o. male.  HPI: Patient with CKD IV/V progressing with significant fluid overload requiring temporary HD.  Past Medical History  Diagnosis Date  . Hypertension   . COPD (chronic obstructive pulmonary disease)   . Chronic kidney disease   . Chronic back pain greater than 3 months duration   . Phlebitis 1992    left calf  . Anxiety and depression   . CHF (congestive heart failure)   . Hepatitis B     Past Surgical History  Procedure Laterality Date  . Wisdom tooth extraction      Family History  Problem Relation Age of Onset  . Stroke Mother   . Cancer Mother     Breast    Social History:  reports that he quit smoking about 5 months ago. His smoking use included Cigarettes. He has Gary Juarez 5 pack-year smoking history. He has never used smokeless tobacco. He reports that he does not drink alcohol or use illicit drugs.  Allergies:  Allergies  Allergen Reactions  . Morphine And Related Other (See Comments)    Reaction:  Headaches     Medications: I have reviewed the patient's current medications.  Results for orders placed or performed during the hospital encounter of 04/27/2015 (from the past 48 hour(s))  CBC     Status: Abnormal   Collection Time: 04/26/2015  2:02 PM  Result Value Ref Range   WBC 4.5 3.8 - 10.6 K/uL   RBC 3.69 (L) 4.40 - 5.90 MIL/uL   Hemoglobin 9.9 (L) 13.0 - 18.0 g/dL   HCT 31.5 (L) 40.0 - 52.0 %   MCV 85.4 80.0 - 100.0 fL   MCH 26.8 26.0 - 34.0 pg   MCHC 31.4 (L) 32.0 - 36.0 g/dL   RDW 20.4 (H) 11.5 - 14.5 %   Platelets 67 (L) 150 - 440 K/uL  Troponin I     Status: None   Collection Time: 05/20/2015  2:02 PM  Result Value Ref Range   Troponin I 0.03 <0.031 ng/mL    Comment:        NO INDICATION OF MYOCARDIAL INJURY.   Comprehensive metabolic panel     Status: Abnormal   Collection Time: 05/20/2015  2:02 PM  Result  Value Ref Range   Sodium 135 135 - 145 mmol/L   Potassium 5.0 3.5 - 5.1 mmol/L   Chloride 105 101 - 111 mmol/L   CO2 22 22 - 32 mmol/L   Glucose, Bld 181 (H) 65 - 99 mg/dL   BUN 76 (H) 6 - 20 mg/dL   Creatinine, Ser 4.21 (H) 0.61 - 1.24 mg/dL   Calcium 8.0 (L) 8.9 - 10.3 mg/dL   Total Protein 6.2 (L) 6.5 - 8.1 g/dL   Albumin 3.0 (L) 3.5 - 5.0 g/dL   AST 14 (L) 15 - 41 U/L   ALT 15 (L) 17 - 63 U/L   Alkaline Phosphatase 62 38 - 126 U/L   Total Bilirubin 0.8 0.3 - 1.2 mg/dL   GFR calc non Af Amer 15 (L) >60 mL/min   GFR calc Af Amer 17 (L) >60 mL/min    Comment: (NOTE) The eGFR has been calculated using the CKD EPI equation. This calculation has not been validated in all clinical situations. eGFR's persistently <60 mL/min signify possible Chronic Kidney Disease.    Anion gap 8 5 - 15  Brain natriuretic  peptide     Status: Abnormal   Collection Time: 04/25/2015  2:02 PM  Result Value Ref Range   B Natriuretic Peptide 1343.0 (H) 0.0 - 100.0 pg/mL  Culture, blood (routine x 2)     Status: None (Preliminary result)   Collection Time: 05/18/2015  4:10 PM  Result Value Ref Range   Specimen Description BLOOD RIGHT AC    Special Requests BOTTLES DRAWN AEROBIC AND ANAEROBIC  [place]    Culture NO GROWTH 2 DAYS    Report Status PENDING   Culture, blood (routine x 2)     Status: None (Preliminary result)   Collection Time: 05/16/2015  4:18 PM  Result Value Ref Range   Specimen Description BLOOD LEFT HAND    Special Requests BOTTLES DRAWN AEROBIC AND ANAEROBIC  4CC    Culture NO GROWTH 2 DAYS    Report Status PENDING   Basic metabolic panel     Status: Abnormal   Collection Time: 05/18/15  5:14 AM  Result Value Ref Range   Sodium 137 135 - 145 mmol/L   Potassium 5.4 (H) 3.5 - 5.1 mmol/L   Chloride 106 101 - 111 mmol/L   CO2 23 22 - 32 mmol/L   Glucose, Bld 243 (H) 65 - 99 mg/dL   BUN 80 (H) 6 - 20 mg/dL   Creatinine, Ser 4.32 (H) 0.61 - 1.24 mg/dL   Calcium 8.3 (L) 8.9 - 10.3 mg/dL   GFR  calc non Af Amer 14 (L) >60 mL/min   GFR calc Af Amer 16 (L) >60 mL/min    Comment: (NOTE) The eGFR has been calculated using the CKD EPI equation. This calculation has not been validated in all clinical situations. eGFR's persistently <60 mL/min signify possible Chronic Kidney Disease.    Anion gap 8 5 - 15  CBC     Status: Abnormal   Collection Time: 05/18/15  5:14 AM  Result Value Ref Range   WBC 3.6 (L) 3.8 - 10.6 K/uL   RBC 3.68 (L) 4.40 - 5.90 MIL/uL   Hemoglobin 9.7 (L) 13.0 - 18.0 g/dL   HCT 31.5 (L) 40.0 - 52.0 %   MCV 85.7 80.0 - 100.0 fL   MCH 26.5 26.0 - 34.0 pg   MCHC 30.9 (L) 32.0 - 36.0 g/dL   RDW 20.7 (H) 11.5 - 14.5 %   Platelets 68 (L) 150 - 440 K/uL  Basic metabolic panel     Status: Abnormal   Collection Time: 05/18/15 11:46 AM  Result Value Ref Range   Sodium 136 135 - 145 mmol/L   Potassium 5.5 (H) 3.5 - 5.1 mmol/L   Chloride 105 101 - 111 mmol/L   CO2 23 22 - 32 mmol/L   Glucose, Bld 216 (H) 65 - 99 mg/dL   BUN 80 (H) 6 - 20 mg/dL   Creatinine, Ser 4.54 (H) 0.61 - 1.24 mg/dL   Calcium 8.5 (L) 8.9 - 10.3 mg/dL   GFR calc non Af Amer 13 (L) >60 mL/min   GFR calc Af Amer 15 (L) >60 mL/min    Comment: (NOTE) The eGFR has been calculated using the CKD EPI equation. This calculation has not been validated in all clinical situations. eGFR's persistently <60 mL/min signify possible Chronic Kidney Disease.    Anion gap 8 5 - 15  Basic metabolic panel     Status: Abnormal   Collection Time: 05/18/15  8:48 PM  Result Value Ref Range   Sodium 134 (L) 135 - 145  mmol/L   Potassium 5.4 (H) 3.5 - 5.1 mmol/L   Chloride 103 101 - 111 mmol/L   CO2 22 22 - 32 mmol/L   Glucose, Bld 268 (H) 65 - 99 mg/dL   BUN 89 (H) 6 - 20 mg/dL   Creatinine, Ser 4.64 (H) 0.61 - 1.24 mg/dL   Calcium 8.1 (L) 8.9 - 10.3 mg/dL   GFR calc non Af Amer 13 (L) >60 mL/min   GFR calc Af Amer 15 (L) >60 mL/min    Comment: (NOTE) The eGFR has been calculated using the CKD EPI  equation. This calculation has not been validated in all clinical situations. eGFR's persistently <60 mL/min signify possible Chronic Kidney Disease.    Anion gap 9 5 - 15  Basic metabolic panel     Status: Abnormal   Collection Time: 05/19/15  4:42 AM  Result Value Ref Range   Sodium 134 (L) 135 - 145 mmol/L   Potassium 5.2 (H) 3.5 - 5.1 mmol/L   Chloride 102 101 - 111 mmol/L   CO2 23 22 - 32 mmol/L   Glucose, Bld 212 (H) 65 - 99 mg/dL   BUN 89 (H) 6 - 20 mg/dL   Creatinine, Ser 4.89 (H) 0.61 - 1.24 mg/dL   Calcium 8.2 (L) 8.9 - 10.3 mg/dL   GFR calc non Af Amer 12 (L) >60 mL/min   GFR calc Af Amer 14 (L) >60 mL/min    Comment: (NOTE) The eGFR has been calculated using the CKD EPI equation. This calculation has not been validated in all clinical situations. eGFR's persistently <60 mL/min signify possible Chronic Kidney Disease.    Anion gap 9 5 - 15  Phosphorus     Status: Abnormal   Collection Time: 05/19/15 10:24 AM  Result Value Ref Range   Phosphorus 8.3 (H) 2.5 - 4.6 mg/dL    US Renal  05/18/2015   CLINICAL DATA:  Acute renal failure.  EXAM: RENAL / URINARY TRACT ULTRASOUND COMPLETE  COMPARISON:  Renal ultrasound 05/03/2015  FINDINGS: Right Kidney:  Length: 10.7 cm. No hydronephrosis. Diffusely increased renal cortical echogenicity.  Left Kidney:  Length: 7.8 cm. No hydronephrosis. Diffusely increased renal cortical echogenicity.  Bladder:  Not fully distended  IMPRESSION: Diffusely increased renal cortical echogenicity as can be seen with chronic medical renal disease.  No hydronephrosis.   Electronically Signed   By: Lovey Newcomer M.D.   On: 05/18/2015 14:21   Dg Chest Port 1 View  04/30/2015   CLINICAL DATA:  Short of breath  EXAM: PORTABLE CHEST 1 VIEW  COMPARISON:  05/01/2015  FINDINGS: The heart remains severely enlarged. Airspace disease at the right base has improved. Airspace disease in the medial right upper lung zone has worsened. Opacity at the left base persists.  No pneumothorax.  IMPRESSION: Stable severe cardiomegaly  Bilateral airspace disease is improved at the right base, stable at the left base but worse in the right upper lobe.   Electronically Signed   By: Marybelle Killings M.D.   On: 04/28/2015 14:36    Review of Systems  Constitutional: Positive for malaise/fatigue.  HENT: Negative.   Eyes: Negative.   Respiratory: Positive for wheezing.   Cardiovascular: Positive for orthopnea and leg swelling.  Gastrointestinal: Negative.   Genitourinary: Negative.   Musculoskeletal: Negative.   Neurological: Positive for weakness.  Endo/Heme/Allergies: Negative.   Psychiatric/Behavioral: Negative.    Blood pressure 125/62, pulse 64, temperature 98.3 F (36.8 C), temperature source Oral, resp. rate 22, height 5' 7"  (  1.702 m), weight 174.317 kg (384 lb 4.8 oz), SpO2 93 %. Physical Exam   Gen: Arousable, moments of falling asleep, mild distress  CV: RR  Lungs: wheezing bilaterally  Abd: obese, panniculitis noted with tenderness  Ext: 2+ edema  Assessment/Plan: CKD IV/V significant fluid overload, hyperkalemia requiring HD  Will place temporary HD catheter   Procedure Note: Informed consent obtained. Patient did not want catheter placed in neck. Right groin prepped and draped in Gary Juarez sterile fashion. Right femoral region over vein- 1% lidocaine was used to anesthesize. Gary Gary Juarez used to access the femoral vein, guide wire advanced, nick made at venotomy site, serial dilators were used and Gary Juarez 20cm temporary dialysis catheter was placed and secured using 2-0 nylon, dressing placed. All ports flushed easily with saline.  Patient tolerated procedure well.  Catheter OK for use.  Gary Gary Juarez, Gary Gary Juarez 05/19/2015, 11:24 AM

## 2015-05-19 NOTE — Progress Notes (Signed)
   SUBJECTIVE: pt remains SOB  Filed Vitals:   05/18/15 2002 05/18/15 2030 05/19/15 0509 05/19/15 0730  BP: 142/93  108/59 138/100  Pulse: 55  49 55  Temp: 97.9 F (36.6 C)  98.3 F (36.8 C)   TempSrc: Oral  Oral   Resp: Height:      Weight:      SpO2: 91% 95% 92% 93%    Intake/Output Summary (Last 24 hours) at 05/19/15 1048 Last data filed at 05/19/15 0830  Gross per 24 hour  Intake    960 ml  Output    150 ml  Net    810 ml    LABS: Basic Metabolic Panel:  Recent Labs  16/10/96 2048 05/19/15 0442  NA 134* 134*  K 5.4* 5.2*  CL 103 102  CO2 22 23  GLUCOSE 268* 212*  BUN 89* 89*  CREATININE 4.64* 4.89*  CALCIUM 8.1* 8.2*   Liver Function Tests:  Recent Labs  05-18-15 1402  AST 14*  ALT 15*  ALKPHOS 62  BILITOT 0.8  PROT 6.2*  ALBUMIN 3.0*   No results for input(s): LIPASE, AMYLASE in the last 72 hours. CBC:  Recent Labs  2015/05/18 1402 05/18/15 0514  WBC 4.5 3.6*  HGB 9.9* 9.7*  HCT 31.5* 31.5*  MCV 85.4 85.7  PLT 67* 68*   Cardiac Enzymes:  Recent Labs  May 18, 2015 1402  TROPONINI 0.03   BNP: Invalid input(s): POCBNP D-Dimer: No results for input(s): DDIMER in the last 72 hours. Hemoglobin A1C: No results for input(s): HGBA1C in the last 72 hours. Fasting Lipid Panel: No results for input(s): CHOL, HDL, LDLCALC, TRIG, CHOLHDL, LDLDIRECT in the last 72 hours. Thyroid Function Tests: No results for input(s): TSH, T4TOTAL, T3FREE, THYROIDAB in the last 72 hours.  Invalid input(s): FREET3 Anemia Panel: No results for input(s): VITAMINB12, FOLATE, FERRITIN, TIBC, IRON, RETICCTPCT in the last 72 hours.   PHYSICAL EXAM General: Obese, disheveled  HEENT: normal Neck: supple. no JVD. Carotids 2+ bilat; no bruits. No lymphadenopathy or thryomegaly appreciated. Cor: PMI nondisplaced. bradycardic & normal rhythm. No rubs, gallops or murmurs. Lungs: decreased breath sounds b/l. Mild wheezes  Abdomen: soft, nontender,  nondistended. No hepatosplenomegaly. No bruits or masses. Good bowel sounds. Extremities: no cyanosis, clubbing, rash. 2+ pedal edema b/l Neuro: alert & oriented x 3, cranial nerves grossly intact. moves all 4 extremities w/o difficulty.  TELEMETRY: Reviewed telemetry pt in sinus bradycardia  ASSESSMENT AND PLAN: acute on chronic diastolic dysfunction 2/2 fluid overload. Pt will start HD today. Consider cardiac cath during this admission as we have been holding off 2/2 renal function up to this point.    Patient and plan discussed with supervising provider, Dr. Adrian Blackwater, who agrees with above findings.   Gary Juarez Margarito Courser [facility]  05/19/2015 10:48 AM

## 2015-05-20 ENCOUNTER — Inpatient Hospital Stay: Payer: Self-pay

## 2015-05-20 LAB — ANTINUCLEAR ANTIBODIES, IFA: ANA Ab, IFA: NEGATIVE

## 2015-05-20 LAB — BLOOD GAS, ARTERIAL
ACID-BASE DEFICIT: 4.1 mmol/L — AB (ref 0.0–2.0)
ACID-BASE EXCESS: 0.2 mmol/L (ref 0.0–3.0)
ALLENS TEST (PASS/FAIL): POSITIVE — AB
Acid-base deficit: 0.2 mmol/L (ref 0.0–2.0)
Allens test (pass/fail): POSITIVE — AB
Allens test (pass/fail): POSITIVE — AB
BICARBONATE: 23 meq/L (ref 21.0–28.0)
BICARBONATE: 26.9 meq/L (ref 21.0–28.0)
BICARBONATE: 25.9 meq/L (ref 21.0–28.0)
DELIVERY SYSTEMS: POSITIVE
EXPIRATORY PAP: 8
FIO2: 28
FIO2: 0.45
FIO2: 0.45
INSPIRATORY PAP: 14
MECHANICAL RATE: 10
O2 SAT: 92.3 %
O2 SAT: 93.8 %
O2 Saturation: 93.7 %
PATIENT TEMPERATURE: 37
PATIENT TEMPERATURE: 37
PO2 ART: 73 mmHg — AB (ref 83.0–108.0)
Patient temperature: 37
pCO2 arterial: 49 mmHg — ABNORMAL HIGH (ref 32.0–48.0)
pCO2 arterial: 51 mmHg — ABNORMAL HIGH (ref 32.0–48.0)
pCO2 arterial: 47 mmHg (ref 32.0–48.0)
pH, Arterial: 7.28 — ABNORMAL LOW (ref 7.350–7.450)
pH, Arterial: 7.33 — ABNORMAL LOW (ref 7.350–7.450)
pH, Arterial: 7.35 (ref 7.350–7.450)
pO2, Arterial: 75 mmHg — ABNORMAL LOW (ref 83.0–108.0)
pO2, Arterial: 73 mmHg — ABNORMAL LOW (ref 83.0–108.0)

## 2015-05-20 LAB — BASIC METABOLIC PANEL
ANION GAP: 8 (ref 5–15)
Anion gap: 10 (ref 5–15)
BUN: 89 mg/dL — AB (ref 6–20)
BUN: 70 mg/dL — ABNORMAL HIGH (ref 6–20)
CALCIUM: 8.4 mg/dL — AB (ref 8.9–10.3)
CALCIUM: 8.2 mg/dL — AB (ref 8.9–10.3)
CHLORIDE: 102 mmol/L (ref 101–111)
CO2: 23 mmol/L (ref 22–32)
CO2: 28 mmol/L (ref 22–32)
CREATININE: 4.76 mg/dL — AB (ref 0.61–1.24)
CREATININE: 3.8 mg/dL — AB (ref 0.61–1.24)
Chloride: 101 mmol/L (ref 101–111)
GFR, EST AFRICAN AMERICAN: 15 mL/min — AB (ref >60)
GFR, EST AFRICAN AMERICAN: 19 mL/min — AB (ref >60)
GFR, EST NON AFRICAN AMERICAN: 13 mL/min — AB (ref >60)
GFR, EST NON AFRICAN AMERICAN: 16 mL/min — AB (ref >60)
GLUCOSE: 164 mg/dL — AB (ref 65–99)
Glucose, Bld: 225 mg/dL — ABNORMAL HIGH (ref 65–99)
Potassium: 5.8 mmol/L — ABNORMAL HIGH (ref 3.5–5.1)
Potassium: 4.7 mmol/L (ref 3.5–5.1)
SODIUM: 135 mmol/L (ref 135–145)
Sodium: 137 mmol/L (ref 135–145)

## 2015-05-20 LAB — GLOMERULAR BASEMENT MEMBRANE ANTIBODIES: GBM Ab: 3 units (ref 0–20)

## 2015-05-20 LAB — COMPLEMENT, TOTAL: COMPL TOTAL (CH50): 52 U/mL (ref 42–60)

## 2015-05-20 LAB — GLUCOSE, CAPILLARY
GLUCOSE-CAPILLARY: 180 mg/dL — AB (ref 65–99)
GLUCOSE-CAPILLARY: 152 mg/dL — AB (ref 65–99)

## 2015-05-20 LAB — CBC
HEMATOCRIT: 31.4 % — AB (ref 40.0–52.0)
Hemoglobin: 10.1 g/dL — ABNORMAL LOW (ref 13.0–18.0)
MCH: 26.9 pg (ref 26.0–34.0)
MCHC: 32.1 g/dL (ref 32.0–36.0)
MCV: 83.7 fL (ref 80.0–100.0)
PLATELETS: 75 10*3/uL — AB (ref 150–440)
RBC: 3.75 MIL/uL — ABNORMAL LOW (ref 4.40–5.90)
RDW: 20.2 % — AB (ref 11.5–14.5)
WBC: 5.6 10*3/uL (ref 3.8–10.6)

## 2015-05-20 LAB — PROTEIN ELECTROPHORESIS, SERUM
A/G Ratio: 1 (ref 0.7–1.7)
ALBUMIN ELP: 3.2 g/dL (ref 2.9–4.4)
ALPHA-2-GLOBULIN: 0.7 g/dL (ref 0.4–1.0)
Alpha-1-Globulin: 0.3 g/dL (ref 0.0–0.4)
BETA GLOBULIN: 1 g/dL (ref 0.7–1.3)
GAMMA GLOBULIN: 1.4 g/dL (ref 0.4–1.8)
Globulin, Total: 3.3 g/dL (ref 2.2–3.9)
Total Protein ELP: 6.5 g/dL (ref 6.0–8.5)

## 2015-05-20 LAB — C3 COMPLEMENT: C3 COMPLEMENT: 84 mg/dL (ref 82–167)

## 2015-05-20 LAB — MPO/PR-3 (ANCA) ANTIBODIES

## 2015-05-20 LAB — HEPATITIS B SURFACE ANTIBODY,QUALITATIVE: HEP B S AB: NONREACTIVE

## 2015-05-20 LAB — MRSA PCR SCREENING: MRSA by PCR: NEGATIVE

## 2015-05-20 LAB — PARATHYROID HORMONE, INTACT (NO CA): PTH: 142 pg/mL — AB (ref 15–65)

## 2015-05-20 LAB — KAPPA/LAMBDA LIGHT CHAINS
KAPPA, LAMDA LIGHT CHAIN RATIO: 1.09 (ref 0.26–1.65)
Kappa free light chain: 100.09 mg/L — ABNORMAL HIGH (ref 3.30–19.40)
Lambda free light chains: 91.89 mg/L — ABNORMAL HIGH (ref 5.71–26.30)

## 2015-05-20 LAB — C4 COMPLEMENT: Complement C4, Body Fluid: 15 mg/dL (ref 14–44)

## 2015-05-20 LAB — HEPATITIS B SURFACE ANTIGEN: HEP B S AG: NEGATIVE

## 2015-05-20 LAB — HEPATITIS B CORE ANTIBODY, TOTAL: HEP B C TOTAL AB: POSITIVE — AB

## 2015-05-20 MED ORDER — METHYLPREDNISOLONE SODIUM SUCC 125 MG IJ SOLR
60.0000 mg | Freq: Every day | INTRAMUSCULAR | Status: DC
  Administered 2015-05-20 – 2015-05-26 (×7): 60 mg via INTRAVENOUS
  Filled 2015-05-20 (×6): qty 2

## 2015-05-20 MED ORDER — PANTOPRAZOLE SODIUM 40 MG IV SOLR
40.0000 mg | INTRAVENOUS | Status: DC
  Administered 2015-05-20 – 2015-05-26 (×7): 40 mg via INTRAVENOUS
  Filled 2015-05-20 (×7): qty 40

## 2015-05-20 MED ORDER — MOMETASONE FURO-FORMOTEROL FUM 100-5 MCG/ACT IN AERO
2.0000 | INHALATION_SPRAY | Freq: Two times a day (BID) | RESPIRATORY_TRACT | Status: DC
  Filled 2015-05-20: qty 8.8

## 2015-05-20 MED ORDER — HYDRALAZINE HCL 25 MG PO TABS
25.0000 mg | ORAL_TABLET | Freq: Three times a day (TID) | ORAL | Status: DC
  Administered 2015-05-21 – 2015-05-26 (×13): 25 mg via ORAL
  Filled 2015-05-20 (×14): qty 1

## 2015-05-20 MED ORDER — IPRATROPIUM-ALBUTEROL 0.5-2.5 (3) MG/3ML IN SOLN
3.0000 mL | RESPIRATORY_TRACT | Status: DC
  Administered 2015-05-21 – 2015-05-26 (×32): 3 mL via RESPIRATORY_TRACT
  Filled 2015-05-20 (×32): qty 3

## 2015-05-20 MED ORDER — AMLODIPINE BESYLATE 10 MG PO TABS: 10.0000 mg | ORAL_TABLET | Freq: Every day | ORAL | Status: DC

## 2015-05-20 MED ORDER — INSULIN ASPART 100 UNIT/ML ~~LOC~~ SOLN
0.0000 [IU] | Freq: Three times a day (TID) | SUBCUTANEOUS | Status: DC
  Administered 2015-05-20: 2 [IU] via SUBCUTANEOUS
  Filled 2015-05-20: qty 2

## 2015-05-20 MED ORDER — INSULIN ASPART 100 UNIT/ML ~~LOC~~ SOLN: 0.0000 [IU] | Freq: Every day | SUBCUTANEOUS | Status: DC

## 2015-05-20 MED ORDER — SODIUM POLYSTYRENE SULFONATE 15 GM/60ML PO SUSP
30.0000 g | Freq: Once | ORAL | Status: AC
  Administered 2015-05-20: 30 g via ORAL
  Filled 2015-05-20: qty 120

## 2015-05-20 MED ORDER — LORAZEPAM 2 MG/ML IJ SOLN
2.0000 mg | Freq: Once | INTRAMUSCULAR | Status: AC
  Administered 2015-05-20: 2 mg via INTRAVENOUS
  Filled 2015-05-20: qty 1

## 2015-05-20 MED ORDER — INSULIN ASPART 100 UNIT/ML ~~LOC~~ SOLN
2.0000 [IU] | SUBCUTANEOUS | Status: DC
  Administered 2015-05-20 – 2015-05-21 (×3): 2 [IU] via SUBCUTANEOUS
  Administered 2015-05-21: 4 [IU] via SUBCUTANEOUS
  Administered 2015-05-21 – 2015-05-22 (×4): 2 [IU] via SUBCUTANEOUS
  Administered 2015-05-23: 4 [IU] via SUBCUTANEOUS
  Administered 2015-05-23 (×2): 2 [IU] via SUBCUTANEOUS
  Administered 2015-05-23 (×3): 4 [IU] via SUBCUTANEOUS
  Administered 2015-05-23 – 2015-05-24 (×3): 2 [IU] via SUBCUTANEOUS
  Administered 2015-05-24 (×3): 4 [IU] via SUBCUTANEOUS
  Administered 2015-05-25: 6 [IU] via SUBCUTANEOUS
  Administered 2015-05-25 (×2): 4 [IU] via SUBCUTANEOUS
  Administered 2015-05-25 – 2015-05-26 (×3): 2 [IU] via SUBCUTANEOUS
  Administered 2015-05-26: 4 [IU] via SUBCUTANEOUS
  Administered 2015-05-26: 2 [IU] via SUBCUTANEOUS
  Administered 2015-05-26 (×2): 4 [IU] via SUBCUTANEOUS
  Filled 2015-05-20: qty 6
  Filled 2015-05-20 (×3): qty 2
  Filled 2015-05-20: qty 4
  Filled 2015-05-20: qty 2
  Filled 2015-05-20: qty 4
  Filled 2015-05-20 (×3): qty 2
  Filled 2015-05-20 (×4): qty 4
  Filled 2015-05-20: qty 2
  Filled 2015-05-20: qty 4
  Filled 2015-05-20: qty 2
  Filled 2015-05-20: qty 4
  Filled 2015-05-20 (×2): qty 2
  Filled 2015-05-20 (×2): qty 4
  Filled 2015-05-20: qty 2
  Filled 2015-05-20: qty 4
  Filled 2015-05-20: qty 2
  Filled 2015-05-20 (×2): qty 4
  Filled 2015-05-20: qty 2
  Filled 2015-05-20: qty 3

## 2015-05-20 MED ORDER — HYDRALAZINE HCL 20 MG/ML IJ SOLN
10.0000 mg | Freq: Four times a day (QID) | INTRAMUSCULAR | Status: DC | PRN
  Administered 2015-05-21: 10 mg via INTRAVENOUS
  Filled 2015-05-20 (×2): qty 1

## 2015-05-20 NOTE — Plan of Care (Signed)
Problem: Phase I Progression Outcomes Goal: Voiding-avoid urinary catheter unless indicated Outcome: Completed/Met Date Met:  05/20/15 Patient is currently receiving hemodialysis.  Problem: Phase II Progression Outcomes Goal: Tolerating diet Outcome: Not Progressing Patient declined dinner

## 2015-05-20 NOTE — Progress Notes (Signed)
Respiratory at patient's bedside drawing ABG. RT to retrieve results. Waiting on MD to assess patient.

## 2015-05-20 NOTE — Progress Notes (Signed)
[facility] - [place] at [facility]   PATIENT NAME: Gevork Ayyad    MR#:  [id number]  DATE OF BIRTH:  Dec 05, 1960  SUBJECTIVE:  CHIEF COMPLAINT:   Chief Complaint  Patient presents with  . Shortness of Breath   see the patient in dialysis Center. He is confused, moaning and unable to answer questions.  REVIEW OF SYSTEMS:  Unable to get a ROS due to confusion.  DRUG ALLERGIES:   Allergies  Allergen Reactions  . Morphine And Related Other (See Comments)    Reaction:  Headaches     VITALS:  Blood pressure 165/80, pulse 75, temperature 97.5 F (36.4 C), temperature source Oral, resp. rate 20, height  (1.702 m), weight 174.1 kg (383 lb 13.1 oz), SpO2 90 %.  PHYSICAL EXAMINATION:  GENERAL:  54 y.o.-year-old patient lying in the bed with no acute distress. Morbidly obese. EYES: Pupils equal, round, reactive to light and accommodation. No scleral icterus. Extraocular muscles intact.  HEENT: Head atraumatic, normocephalic. Oropharynx and nasopharynx clear. Moist oral mucosa. NECK:  Supple, no jugular venous distention. No thyroid enlargement, no tenderness.  LUNGS: Weak breath sounds bilaterally, no wheezing, mild rales. No use of accessory muscles of respiration.  CARDIOVASCULAR: S1, S2 normal. No murmurs, rubs, or gallops.  ABDOMEN: Soft, nontender, nondistended. Bowel sounds present. Abdominal wall edema 1+. EXTREMITIES: Bilateral lower extremity edema 2-3+, no cyanosis, or clubbing.  NEUROLOGIC: Confused, unable to exam. PSYCHIATRIC: The patient is confused. SKIN: No obvious rash, lesion, or ulcer.    LABORATORY PANEL:   CBC  Recent Labs Lab 05/20/15 1224  WBC 5.6  HGB 10.1*  HCT 31.4*  PLT 75*   ------------------------------------------------------------------------------------------------------------------  Chemistries   Recent Labs Lab 05/22/2015 1402  05/20/15 0428  NA 135  < > 135  K 5.0  < > 5.8*  CL 105  < > 102  CO2 22   < > 23  GLUCOSE 181*  < > 225*  BUN 76*  < > 89*  CREATININE 4.21*  < > 4.76*  CALCIUM 8.0*  < > 8.4*  AST 14*  --   --   ALT 15*  --   --   ALKPHOS 62  --   --   BILITOT 0.8  --   --   < > = values in this interval not displayed. ------------------------------------------------------------------------------------------------------------------  Cardiac Enzymes  Recent Labs Lab 05/19/2015 1402  TROPONINI 0.03   ------------------------------------------------------------------------------------------------------------------  RADIOLOGY:  US Renal  05/18/2015   CLINICAL DATA:  Acute renal failure.  EXAM: RENAL / URINARY TRACT ULTRASOUND COMPLETE  COMPARISON:  Renal ultrasound 05/03/2015  FINDINGS: Right Kidney:  Length: 10.7 cm. No hydronephrosis. Diffusely increased renal cortical echogenicity.  Left Kidney:  Length: 7.8 cm. No hydronephrosis. Diffusely increased renal cortical echogenicity.  Bladder:  Not fully distended  IMPRESSION: Diffusely increased renal cortical echogenicity as can be seen with chronic medical renal disease.  No hydronephrosis.   Electronically Signed   By: Annia Belt M.D.   On: 05/18/2015 14:21    EKG:   Orders placed or performed during the hospital encounter of 05/07/2015  . ED EKG  . ED EKG    ASSESSMENT AND PLAN:   1. Acute on chronic respiratory failure- continue oxygen of 2 L nasal cannula.  2. COPD exacerbation. Taper IV Solu-Medrol, Levaquin, budesonide nebulizer, Spiriva and DuoNeb nebulizers.  3. Acute on chronic diastolic congestive heart failure. Started hemodialysis yesterday, On Lasix 80 mg IV twice a day. Coreg was  on hold secondary to bradycardia. Bradycardia is better.  hydralazine and norvasc were on hold secondary to relative hypotension. Blood pressure is high, we will resume hydralazine and norvasc.  4. Acute on chronic kidney disease stage IV, progressed to stage V Started hemodialysis yesterday and got hemodialysis today.  5.  Morbid obesity and likely sleep apnea 6. History of hepatitis B and liver disease- on lactulose 7. Chronic back pain on chronic pain medications.  Hyperkalemia. Hold spironolactone, given Kayexalate yesterday. K 5.8 this morning. Got hemodialysis today per Dr. Doristine Church recommendation. Follow up BMP.  Chronic thrombocytopenia. Stable.  Altered mental status due to acute metabolic encephalopathy. Possible due to Neurontin per Dr. Thedore Mins, which was discontinued this morning. Fall and aspiration precaution.  I discussed with Dr. Thedore Mins. All the records are reviewed and case discussed with Care Management/Social Workerr. Management plans discussed with the patient, his father and they are in agreement.  CODE STATUS: Full code  TOTAL TIME TAKING CARE OF THIS PATIENT: 45 minutes.   POSSIBLE D/C IN >3 DAYS, DEPENDING ON CLINICAL CONDITION.   Shaune Pollack M.D on 05/20/2015 at 1:47 PM  Between 7am to 6pm - Pager - [phone]  After 6pm go to [email or web address] - password [email or web address] [facility]  [place] [facility]  Office  [phone]  CC: Primary care physician; Verlee Monte, PA-C

## 2015-05-20 NOTE — Progress Notes (Signed)
   SUBJECTIVE: Pt sitting on edge of bed, in and out of responsiveness. C/o occasional CP.    Filed Vitals:   05/19/15 2055 05/20/15 0411 05/20/15 0918 05/20/15 0923  BP: 129/86 145/93 138/66   Pulse: 57 55 55   Temp: 97.4 F (36.3 C) 97.9 F (36.6 C)    TempSrc: Oral Oral    Resp: Height:      Weight:      SpO2: 92% 94% 91% 92%    Intake/Output Summary (Last 24 hours) at 05/20/15 1007 Last data filed at 05/19/15 2158  Gross per 24 hour  Intake    250 ml  Output   1000 ml  Net   -750 ml    LABS: Basic Metabolic Panel:  Recent Labs  16/10/96 0442 05/19/15 1024 05/20/15 0428  NA 134*  --  135  K 5.2*  --  5.8*  CL 102  --  102  CO2 23  --  23  GLUCOSE 212*  --  225*  BUN 89*  --  89*  CREATININE 4.89*  --  4.76*  CALCIUM 8.2*  --  8.4*  PHOS  --  8.3*  --    Liver Function Tests:  Recent Labs  05/03/2015 1402  AST 14*  ALT 15*  ALKPHOS 62  BILITOT 0.8  PROT 6.2*  ALBUMIN 3.0*   No results for input(s): LIPASE, AMYLASE in the last 72 hours. CBC:  Recent Labs  05/24/2015 1402 05/18/15 0514  WBC 4.5 3.6*  HGB 9.9* 9.7*  HCT 31.5* 31.5*  MCV 85.4 85.7  PLT 67* 68*   Cardiac Enzymes:  Recent Labs  04/27/2015 1402  TROPONINI 0.03   BNP: Invalid input(s): POCBNP D-Dimer: No results for input(s): DDIMER in the last 72 hours. Hemoglobin A1C: No results for input(s): HGBA1C in the last 72 hours. Fasting Lipid Panel: No results for input(s): CHOL, HDL, LDLCALC, TRIG, CHOLHDL, LDLDIRECT in the last 72 hours. Thyroid Function Tests: No results for input(s): TSH, T4TOTAL, T3FREE, THYROIDAB in the last 72 hours.  Invalid input(s): FREET3 Anemia Panel: No results for input(s): VITAMINB12, FOLATE, FERRITIN, TIBC, IRON, RETICCTPCT in the last 72 hours.   PHYSICAL EXAM General: Obese, disheveled  HEENT: normal Neck: supple. no JVD. Carotids 2+ bilat; no bruits. No lymphadenopathy or thryomegaly appreciated. Cor: PMI nondisplaced.  bradycardic & normal rhythm. No rubs, gallops or murmurs. Lungs: decreased breath sounds b/l. Mild wheezes  Abdomen: soft, nontender, nondistended. No hepatosplenomegaly. No bruits or masses. Good bowel sounds. Extremities: no cyanosis, clubbing, rash. 2+ pedal edema b/l Neuro: alert & oriented x 3, cranial nerves grossly intact. moves all 4 extremities w/o difficulty.  TELEMETRY: Reviewed telemetry pt in sinus bradycardia  ASSESSMENT AND PLAN: acute on chronic diastolic dysfunction 2/2 fluid overload. Pt will contine HD today. Plan for cardiac cath tomorrow.    Patient and plan discussed with supervising provider, Dr. Adrian Blackwater, who agrees with above findings.   Gary Juarez Margarito Courser [facility]  05/20/2015 10:07 AM

## 2015-05-20 NOTE — Progress Notes (Addendum)
Upon change of shift/ shift assessment, patient found to be unresponsive to nipple twist and sternal rub with oxygen saturations 85% on 2L [place]. Patient placed on venti-mask %. Oxygen improved to 94% and Other VS and CBG obtained, all WNL. MD notified of patient's current status. MD to come assess patient. Also, orders obtained for STAT ABG

## 2015-05-20 NOTE — Progress Notes (Signed)
Entered patient's room at 0715 for bedside report. Patient was standing by the sink and urinated in the floor. Oxygen was not in place. Patient did not answer nursing staff appropriately and appeared dazed. Notified Dr.Chen of changes. Notified Dr.Lateef per Dr.Chen's request for dialysis appointment. Orders in place. ABG was did not warrant CPAP. Pt answered RT and family members appropriately. Mental status appears to have changed since 0600 medication administration. Pt is sitting on the side of the bed. Family members are at the bedside.

## 2015-05-20 NOTE — Progress Notes (Signed)
Called to evaluate patient as he was lethargic/unresponsive after hemodialysis today.  Pt. Was apparently agitated at dialyis and got so Ativan and then was able to finish his dialysis but has been lethargic and somnolent since then.    Pt. Also had his Gabapentin discontinued as that could be contributing to his altered mental status.   Physical exam:  GENERAL: Lethargic/encephalopathic.  HEAD, EYES, EARS, NOSE AND THROAT: Atraumatic, normocephalic. PERRL  HEART: Regular rate and rhythm, tachycardic. No murmurs, no rubs, no clicks.  LUNGS: Clear to auscultation bilaterally. No rales or rhonchi. No wheezes.  NEUROLOGIC: Patient is lethargic/encephalopathic and does not respond to sternal rub.    ABG    Component Value Date/Time   PHART 7.33* 05/20/2015 2005   PCO2ART 51* 05/20/2015 2005   PO2ART 75* 05/20/2015 2005   HCO3 26.9 05/20/2015 2005   ACIDBASEDEF 4.1* 05/20/2015 0802   O2SAT 93.8 05/20/2015 2005    Assessment & Plan:  #1Altered mental status-this is likely multifactorial. Likely due to hypercarbic respiratory failure, complicated with benzodiazepine use and also patient's use of high-dose gabapentin. -Patient presently is unresponsive to sternal rub. -We'll transfer patient to stepdown level of care. Place patient on BiPAP. Repeat ABG in 2 hours. -Avoid any sedative meds, benzodiazepines, or any other neurogenic meds. -If ABG and patient's mental status is not improving he likely may need to get intubated.  #2 hypercarbic respiratory failure-likely due to underlying COPD combined with recent benzodiazepine given for his agitation. -We'll place on BiPAP and follow serial ABGs.  Critical care time spent: 30 min.    Plan discussed w/ nursing staff and also family at bedside.

## 2015-05-20 NOTE — Progress Notes (Addendum)
Inpatient Diabetes Program Recommendations  AACE/ADA: New Consensus Statement on Inpatient Glycemic Control (2015)  Target Ranges:  Prepandial:   less than 140 mg/dL      Peak postprandial:   less than 180 mg/dL (1-2 hours)      Critically ill patients:  140 - 180 mg/dL   Review of Glycemic Control:  Results for [name], [name] (MRN [id number]) as of 05/20/2015 11:09  Ref. Range 05/18/2015 05:14 05/18/2015 11:46 05/18/2015 20:48 05/19/2015 04:42 05/20/2015 04:28  Glucose Latest Ref Range: 65-99 mg/dL 213 (H) 086 (H) 578 (H) 212 (H) 225 (H)    Diabetes history: No history noted Outpatient Diabetes medications: None Inpatient Diabetes Program Recommendations:   Note elevated glucose with IV steroids.  May consider checking CBG's tid with meals and HS.  If greater than 180 mg/dL, consider adding Novolog sensitive correction due to elevated BUN/Creatinine.   Thanks, Beryl Meager, RN, BC-ADM Inpatient Diabetes Coordinator Pager [phone] (8a-5p)

## 2015-05-20 NOTE — Progress Notes (Signed)
eLink Physician-Brief Progress Note Patient Name: BIJAN RIDGLEY DOB: 1960/12/02 MRN: [id number]   Date of Service  05/20/2015  HPI/Events of Note  New pt evaluation.  Pt tfr from floor with hypoxic respiratory failure on bipap.  Pt with AMS as well s/p HD earlier today  eICU Interventions  See orders, increase nebs, adjust bipap, repeat ABGs  See orders. Will ask pulm ccm to consult in am for f/u and assistance.  Needs/meets ICU level of care for tonite     Intervention Category Major Interventions: Respiratory failure - evaluation and management Evaluation Type: New Patient Evaluation  Shan Levans 05/20/2015, 9:20 PM

## 2015-05-20 NOTE — Progress Notes (Signed)
Patient confused and agitated in the dialysis suite. Cursing. Trying to get out pf bed PLAN - Sitter during dialysis - Ativan 2 mg iv x 1  - If still agitated, will cancel treatment and try again tomorrow

## 2015-05-20 NOTE — Progress Notes (Signed)
Patient continues to removes oxygen, must be reminded to keep in place. Pt is sitting on side of bed, dozing off. Expressed concerns about safety to patient. Patient has ignored instructions. Refuses to have bed alarm activated.

## 2015-05-20 NOTE — Progress Notes (Signed)
Patient transported via bed with venti mask in place to ICU room 06. Patient's father escorted to the ICU waiting room. Patient's father, Rocky Link, took patient's belongings, including clothing, shoes and cellphone. Orderly to retrieve patient's keys, that were sent to pharmacy, accidentally with home medications for storage, and give to patient's father.

## 2015-05-20 NOTE — Progress Notes (Signed)
eLink Physician-Brief Progress Note Patient Name: Gary Juarez DOB: 1961-03-26 MRN: [id number]   Date of Service  05/20/2015  HPI/Events of Note  F/u CXR shows acute pulm edema. ABGs acceptable.  eICU Interventions  Will monitor, may yet need intubation      Intervention Category Major Interventions: Respiratory failure - evaluation and management  Shan Levans 05/20/2015, 10:56 PM

## 2015-05-20 NOTE — Progress Notes (Signed)
MD at bedside to assess patient. Patient remains unresponsive. MD given VS, CBG and ABG results. Also, MD spoke with RT about patient's plan to move to ICU stepdown, will need Bipap and possible intubation.

## 2015-05-20 NOTE — Progress Notes (Signed)
Post dialysis pt has constantly moaned with eyes closed but is arousable by sternal rub and has garbled speech which is a complete shift in mental status since this morning al;though pt was somewhat confused predialysis. MD paged and made aware of change and he stated that Gabapentin was D/C and he has placed no new orders. MD stated that he saw the pt in dialysis and does not feel the need to come to reassess the pt. Pt remains moaning with family at bedside.

## 2015-05-20 NOTE — Progress Notes (Signed)
Report called to ICU RN [facility]

## 2015-05-20 NOTE — Care Management (Signed)
Patient  Required placement of a temporary dialysis catheter and is requiring dialysis.  At present, it is not known if this will be chronic.  Patient is currently followed by [facility] nursing and physical therapy.  Notified agency of admission.  Patient is current with [facility] since Feb 2016.  Unsure if he is followed by [facility].  Have notified Louretta Parma of the need to assess for [facility]

## 2015-05-20 NOTE — Progress Notes (Signed)
Notified physician of mental changes, pt is confused, incomprehensible conversation. Pt continues to remove oxygen. Pulse ox is above 90%. Obtained order for ABG.

## 2015-05-20 NOTE — Progress Notes (Signed)
Subjective:  Renal function worse, Massive edema noted.    Pt also with hyperkalemia this AM. Bags of potato chips and Reeses candy are found in room along with several bottles of regular coke.  Was started on temporary dialysis after failure of diuretic therapy 1st HD done on 9/25 (sunday)  Objective:  Vital signs in last 24 hours:  Temp:  [97.4 F (36.3 C)-97.9 F (36.6 C)] 97.9 F (36.6 C) (09/26 0411) Pulse Rate:  [50-64] 55 (09/26 0411) Resp:  [14-24] 20 (09/26 0411) BP: (125-145)/(62-93) 145/93 mmHg (09/26 0411) SpO2:  [91 %-94 %] 94 % (09/26 0411)  Weight change:  Filed Weights   05/09/2015 1359 05/16/2015 1742  Weight: 178.201 kg (392 lb 13.8 oz) 174.317 kg (384 lb 4.8 oz)    Intake/Output: I/O last 3 completed shifts: In: 730 [P.O.:730] Out: 1000 [Other:1000]   Intake/Output this shift:     Physical Exam: General: morbidly obese NAD, sitting up on edge of the bed  Head: Normocephalic, atraumatic. Moist oral mucosal membranes  Eyes: Anicteric  Neck: Supple, trachea midline  Lungs:  Mild bilateral rales normal effort  Heart: S1S2 no rubs  Abdomen:  Soft, nontender, obese, lower abdominal wall edema noted  Extremities: 3+ b/l LE edema  Neurologic: Lethargic, answers a few questions only  Skin: No lesions       Basic Metabolic Panel:  Recent Labs Lab 05/18/15 0514 05/18/15 1146 05/18/15 2048 05/19/15 0442 05/19/15 1024 05/20/15 0428  NA 137 136 134* 134*  --  135  K 5.4* 5.5* 5.4* 5.2*  --  5.8*  CL 106 105 103 102  --  102  CO2 --  23  GLUCOSE 243* 216* 268* 212*  --  225*  BUN 80* 80* 89* 89*  --  89*  CREATININE 4.32* 4.54* 4.64* 4.89*  --  4.76*  CALCIUM 8.3* 8.5* 8.1* 8.2*  --  8.4*  PHOS  --   --   --   --  8.3*  --     Liver Function Tests:  Recent Labs Lab 05/19/2015 1402  AST 14*  ALT 15*  ALKPHOS 62  BILITOT 0.8  PROT 6.2*  ALBUMIN 3.0*   No results for input(s): LIPASE, AMYLASE in the last 168 hours. No results  for input(s): AMMONIA in the last 168 hours.  CBC:  Recent Labs Lab 05/21/2015 1402 05/18/15 0514  WBC 4.5 3.6*  HGB 9.9* 9.7*  HCT 31.5* 31.5*  MCV 85.4 85.7  PLT 67* 68*    Cardiac Enzymes:  Recent Labs Lab 05/08/2015 1402  TROPONINI 0.03    BNP: Invalid input(s): POCBNP  CBG: No results for input(s): GLUCAP in the last 168 hours.  Microbiology: Results for orders placed or performed during the hospital encounter of 04/25/2015  Culture, blood (routine x 2)     Status: None (Preliminary result)   Collection Time: 04/30/2015  4:10 PM  Result Value Ref Range Status   Specimen Description BLOOD RIGHT AC  Final   Special Requests BOTTLES DRAWN AEROBIC AND ANAEROBIC  9CC  Final   Culture NO GROWTH 3 DAYS  Final   Report Status PENDING  Incomplete  Culture, blood (routine x 2)     Status: None (Preliminary result)   Collection Time: 05/24/2015  4:18 PM  Result Value Ref Range Status   Specimen Description BLOOD LEFT HAND  Final   Special Requests BOTTLES DRAWN AEROBIC AND ANAEROBIC  4CC  Final   Culture NO  GROWTH 3 DAYS  Final   Report Status PENDING  Incomplete    Coagulation Studies: No results for input(s): LABPROT, INR in the last 72 hours.  Urinalysis: No results for input(s): COLORURINE, LABSPEC, PHURINE, GLUCOSEU, HGBUR, BILIRUBINUR, KETONESUR, PROTEINUR, UROBILINOGEN, NITRITE, LEUKOCYTESUR in the last 72 hours.  Invalid input(s): APPERANCEUR    Imaging: US Renal  05/18/2015   CLINICAL DATA:  Acute renal failure.  EXAM: RENAL / URINARY TRACT ULTRASOUND COMPLETE  COMPARISON:  Renal ultrasound 05/03/2015  FINDINGS: Right Kidney:  Length: 10.7 cm. No hydronephrosis. Diffusely increased renal cortical echogenicity.  Left Kidney:  Length: 7.8 cm. No hydronephrosis. Diffusely increased renal cortical echogenicity.  Bladder:  Not fully distended  IMPRESSION: Diffusely increased renal cortical echogenicity as can be seen with chronic medical renal disease.  No  hydronephrosis.   Electronically Signed   By: Annia Belt M.D.   On: 05/18/2015 14:21     Medications:     . aspirin EC  81 mg Oral Daily  . atorvastatin  20 mg Oral QHS  . budesonide (PULMICORT) nebulizer solution  0.25 mg Nebulization BID  . clopidogrel  75 mg Oral Daily  . fluticasone  2 spray Each Nare Daily  . furosemide  80 mg Intravenous BID  . heparin  5,000 Units Subcutaneous 3 times per day  . ipratropium-albuterol  3 mL Nebulization Q6H WA  . isosorbide mononitrate  30 mg Oral Daily  . lactulose  30 g Oral BID  . methylPREDNISolone (SOLU-MEDROL) injection  60 mg Intravenous Q12H  . pantoprazole  40 mg Oral QHS  . sodium chloride  3 mL Intravenous Q12H  . tiotropium  18 mcg Inhalation Daily   sodium chloride, sodium chloride, albuterol, alteplase, heparin, HYDROcodone-acetaminophen, lidocaine (PF), lidocaine-prilocaine, nitroGLYCERIN, pentafluoroprop-tetrafluoroeth, sodium chloride  Assessment/ Plan:  [name] is a 54 y.o.  white male with hepatits B, hypertension, COPD, anemia, thrombocytopenia, morbid obesity, tobacco abuse.  1. 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 Creatinine baseline of 3.5 from 9/16.  Acute renal failure due to acute exacerbation of diastolic heart failure. - Renal function worse, pt also with hyperkalemia and massive volume overload.   - started on HD. 1st treatment was on 9/25   - plan for another session of HD today - hold gabapentin - may be causing somnolence. May restart after mental status normalized  - use renally adjusted dose of 300 mg po qhs  2. Acute diastolic heart failure:  Significant weight gain noted in a short period of time, lasix doesn't appear to be fully effective, will opt for HD for now.  3.  Massive generalized edema R60.1:  Significant edema noted, pt likely has at least 40lbs of fluid overload.  Will plan for HD, may need daily HD  for a bit.    LOS: 3 SINGH,HARMEET 9/26/20168:47 AM

## 2015-05-21 ENCOUNTER — Inpatient Hospital Stay: Payer: Self-pay

## 2015-05-21 ENCOUNTER — Encounter: Admission: EM | Disposition: E | Payer: Self-pay | Source: Home / Self Care | Attending: Internal Medicine

## 2015-05-21 ENCOUNTER — Inpatient Hospital Stay: Payer: MEDICAID

## 2015-05-21 ENCOUNTER — Inpatient Hospital Stay: Payer: MEDICAID | Admitting: Anesthesiology

## 2015-05-21 DIAGNOSIS — J9601 Acute respiratory failure with hypoxia: Secondary | ICD-10-CM

## 2015-05-21 DIAGNOSIS — J9621 Acute and chronic respiratory failure with hypoxia: Secondary | ICD-10-CM

## 2015-05-21 DIAGNOSIS — R5383 Other fatigue: Secondary | ICD-10-CM

## 2015-05-21 DIAGNOSIS — I509 Heart failure, unspecified: Secondary | ICD-10-CM

## 2015-05-21 DIAGNOSIS — T884XXA Failed or difficult intubation, initial encounter: Secondary | ICD-10-CM

## 2015-05-21 DIAGNOSIS — I5033 Acute on chronic diastolic (congestive) heart failure: Secondary | ICD-10-CM | POA: Insufficient documentation

## 2015-05-21 DIAGNOSIS — G9341 Metabolic encephalopathy: Secondary | ICD-10-CM

## 2015-05-21 DIAGNOSIS — N179 Acute kidney failure, unspecified: Secondary | ICD-10-CM

## 2015-05-21 LAB — BLOOD GAS, ARTERIAL
ACID-BASE DEFICIT: 2.6 mmol/L — AB (ref 0.0–2.0)
ALLENS TEST (PASS/FAIL): POSITIVE — AB
Acid-Base Excess: 0.5 mmol/L (ref 0.0–3.0)
Allens test (pass/fail): POSITIVE — AB
Bicarbonate: 26 mEq/L (ref 21.0–28.0)
Bicarbonate: 27.6 mEq/L (ref 21.0–28.0)
Delivery systems: POSITIVE
Expiratory PAP: 8
FIO2: 0.45
FIO2: 1
Inspiratory PAP: 14
Mechanical Rate: 10
O2 Saturation: 95.5 %
O2 Saturation: 95.8 %
PCO2 ART: 74 mmHg — AB (ref 32.0–48.0)
PEEP: 15 cmH2O
PO2 ART: 92 mmHg (ref 83.0–108.0)
Patient temperature: 37
RATE: 15 resp/min
VT: 500 mL
pCO2 arterial: 44 mmHg (ref 32.0–48.0)
pH, Arterial: 7.38 (ref 7.350–7.450)
pH, Arterial: 7.18 — CL (ref 7.350–7.450)
pO2, Arterial: 80 mmHg — ABNORMAL LOW (ref 83.0–108.0)

## 2015-05-21 LAB — BASIC METABOLIC PANEL
Anion gap: 8 (ref 5–15)
BUN: 78 mg/dL — AB (ref 6–20)
CALCIUM: 8.4 mg/dL — AB (ref 8.9–10.3)
CO2: 26 mmol/L (ref 22–32)
CREATININE: 4.39 mg/dL — AB (ref 0.61–1.24)
Chloride: 103 mmol/L (ref 101–111)
GFR, EST AFRICAN AMERICAN: 16 mL/min — AB (ref >60)
GFR, EST NON AFRICAN AMERICAN: 14 mL/min — AB (ref >60)
Glucose, Bld: 136 mg/dL — ABNORMAL HIGH (ref 65–99)
Potassium: 4.8 mmol/L (ref 3.5–5.1)
SODIUM: 137 mmol/L (ref 135–145)

## 2015-05-21 LAB — MAGNESIUM: MAGNESIUM: 2.5 mg/dL — AB (ref 1.7–2.4)

## 2015-05-21 LAB — TYPE AND SCREEN
ABO/RH(D): A POS
Antibody Screen: NEGATIVE

## 2015-05-21 LAB — GLUCOSE, CAPILLARY
GLUCOSE-CAPILLARY: 131 mg/dL — AB (ref 65–99)
GLUCOSE-CAPILLARY: 154 mg/dL — AB (ref 65–99)
Glucose-Capillary: 114 mg/dL — ABNORMAL HIGH (ref 65–99)
Glucose-Capillary: 132 mg/dL — ABNORMAL HIGH (ref 65–99)
Glucose-Capillary: 133 mg/dL — ABNORMAL HIGH (ref 65–99)
Glucose-Capillary: 134 mg/dL — ABNORMAL HIGH (ref 65–99)
Glucose-Capillary: 139 mg/dL — ABNORMAL HIGH (ref 65–99)

## 2015-05-21 LAB — CBC
HCT: 30.4 % — ABNORMAL LOW (ref 40.0–52.0)
HCT: 30.6 % — ABNORMAL LOW (ref 40.0–52.0)
HEMOGLOBIN: 9.8 g/dL — AB (ref 13.0–18.0)
HEMOGLOBIN: 9.8 g/dL — AB (ref 13.0–18.0)
MCH: 26.6 pg (ref 26.0–34.0)
MCH: 26.5 pg (ref 26.0–34.0)
MCHC: 32.2 g/dL (ref 32.0–36.0)
MCHC: 31.9 g/dL — ABNORMAL LOW (ref 32.0–36.0)
MCV: 82.6 fL (ref 80.0–100.0)
MCV: 83 fL (ref 80.0–100.0)
PLATELETS: 85 10*3/uL — AB (ref 150–440)
PLATELETS: 58 10*3/uL — AB (ref 150–440)
RBC: 3.68 MIL/uL — ABNORMAL LOW (ref 4.40–5.90)
RBC: 3.69 MIL/uL — AB (ref 4.40–5.90)
RDW: 20.1 % — AB (ref 11.5–14.5)
RDW: 20 % — ABNORMAL HIGH (ref 11.5–14.5)
WBC: 8.6 10*3/uL (ref 3.8–10.6)
WBC: 9.6 10*3/uL (ref 3.8–10.6)

## 2015-05-21 LAB — AMMONIA: Ammonia: 105 umol/L — ABNORMAL HIGH (ref 9–35)

## 2015-05-21 LAB — ABO/RH: ABO/RH(D): A POS

## 2015-05-21 LAB — HEMOGLOBIN A1C: HEMOGLOBIN A1C: 5.7 % (ref 4.0–6.0)

## 2015-05-21 LAB — PHOSPHORUS: Phosphorus: 7.4 mg/dL — ABNORMAL HIGH (ref 2.5–4.6)

## 2015-05-21 SURGERY — LEFT HEART CATH AND CORONARY ANGIOGRAPHY
Anesthesia: Moderate Sedation | Laterality: Right

## 2015-05-21 MED ORDER — ANTISEPTIC ORAL RINSE SOLUTION (CORINZ)
7.0000 mL | Freq: Four times a day (QID) | OROMUCOSAL | Status: DC
  Administered 2015-05-21 – 2015-05-24 (×12): 7 mL via OROMUCOSAL
  Filled 2015-05-21 (×16): qty 7

## 2015-05-21 MED ORDER — MIDAZOLAM HCL 2 MG/2ML IJ SOLN
INTRAMUSCULAR | Status: AC
  Administered 2015-05-21: 4 mg via INTRAVENOUS
  Filled 2015-05-21: qty 4

## 2015-05-21 MED ORDER — PRO-STAT SUGAR FREE PO LIQD
30.0000 mL | Freq: Two times a day (BID) | ORAL | Status: DC
  Administered 2015-05-21 – 2015-05-24 (×7): 30 mL via ORAL

## 2015-05-21 MED ORDER — CHLORHEXIDINE GLUCONATE 0.12% ORAL RINSE (MEDLINE KIT)
15.0000 mL | Freq: Two times a day (BID) | OROMUCOSAL | Status: DC
  Administered 2015-05-21 – 2015-05-24 (×7): 15 mL via OROMUCOSAL
  Filled 2015-05-21 (×9): qty 15

## 2015-05-21 MED ORDER — VECURONIUM BROMIDE 10 MG IV SOLR
20.0000 mg | Freq: Once | INTRAVENOUS | Status: AC
  Administered 2015-05-21: 20 mg via INTRAVENOUS

## 2015-05-21 MED ORDER — SENNOSIDES-DOCUSATE SODIUM 8.6-50 MG PO TABS: 1.0000 | ORAL_TABLET | Freq: Two times a day (BID) | ORAL | Status: DC | PRN

## 2015-05-21 MED ORDER — AMIODARONE HCL IN DEXTROSE 360-4.14 MG/200ML-% IV SOLN
60.0000 mg/h | INTRAVENOUS | Status: AC
  Administered 2015-05-21: 60 mg/h via INTRAVENOUS
  Filled 2015-05-21: qty 200

## 2015-05-21 MED ORDER — VECURONIUM BROMIDE 10 MG IV SOLR
INTRAVENOUS | Status: AC
  Administered 2015-05-21: 20 mg via INTRAVENOUS
  Filled 2015-05-21: qty 20

## 2015-05-21 MED ORDER — MIDAZOLAM HCL 2 MG/2ML IJ SOLN
2.0000 mg | INTRAMUSCULAR | Status: DC | PRN
  Administered 2015-05-21 – 2015-05-23 (×6): 2 mg via INTRAVENOUS
  Filled 2015-05-21 (×3): qty 2

## 2015-05-21 MED ORDER — FENTANYL CITRATE (PF) 100 MCG/2ML IJ SOLN
200.0000 ug | Freq: Once | INTRAMUSCULAR | Status: AC
  Administered 2015-05-21: 200 ug via INTRAVENOUS

## 2015-05-21 MED ORDER — FENTANYL CITRATE (PF) 100 MCG/2ML IJ SOLN
50.0000 ug | Freq: Once | INTRAMUSCULAR | Status: AC
  Administered 2015-05-21: 50 ug via INTRAVENOUS

## 2015-05-21 MED ORDER — VANCOMYCIN HCL IN DEXTROSE 1-5 GM/200ML-% IV SOLN
1000.0000 mg | INTRAVENOUS | Status: DC | PRN
  Administered 2015-05-22 – 2015-05-23 (×2): 1000 mg via INTRAVENOUS
  Filled 2015-05-21 (×5): qty 200

## 2015-05-21 MED ORDER — FENTANYL 2500MCG IN NS 250ML (10MCG/ML) PREMIX INFUSION
25.0000 ug/h | INTRAVENOUS | Status: DC
  Administered 2015-05-21 (×2): 100 ug/h via INTRAVENOUS
  Administered 2015-05-22: 120 ug/h via INTRAVENOUS
  Administered 2015-05-22: 200 ug/h via INTRAVENOUS
  Administered 2015-05-22 – 2015-05-23 (×2): 250 ug/h via INTRAVENOUS
  Filled 2015-05-21 (×7): qty 250

## 2015-05-21 MED ORDER — DEXMEDETOMIDINE HCL IN NACL 400 MCG/100ML IV SOLN
0.0000 ug/kg/h | INTRAVENOUS | Status: DC
  Administered 2015-05-21: 0.2 ug/kg/h via INTRAVENOUS
  Filled 2015-05-21 (×2): qty 100

## 2015-05-21 MED ORDER — CARVEDILOL 3.125 MG PO TABS
3.1250 mg | ORAL_TABLET | Freq: Two times a day (BID) | ORAL | Status: DC
  Administered 2015-05-21 – 2015-05-22 (×2): 3.125 mg via ORAL
  Filled 2015-05-21 (×3): qty 1

## 2015-05-21 MED ORDER — VITAL HIGH PROTEIN PO LIQD
1000.0000 mL | ORAL | Status: DC
  Administered 2015-05-21 (×3)
  Administered 2015-05-22 (×2): 1000 mL
  Administered 2015-05-22: 13:00:00
  Administered 2015-05-23: 1000 mL
  Administered 2015-05-23: 09:00:00
  Administered 2015-05-24: 1000 mL

## 2015-05-21 MED ORDER — VANCOMYCIN HCL 10 G IV SOLR
2500.0000 mg | Freq: Once | INTRAVENOUS | Status: AC
  Administered 2015-05-21: 2500 mg via INTRAVENOUS
  Filled 2015-05-21: qty 2500

## 2015-05-21 MED ORDER — FENTANYL CITRATE (PF) 100 MCG/2ML IJ SOLN
INTRAMUSCULAR | Status: AC
  Administered 2015-05-21: 200 ug via INTRAVENOUS
  Filled 2015-05-21: qty 4

## 2015-05-21 MED ORDER — PIPERACILLIN-TAZOBACTAM 3.375 G IVPB
3.3750 g | Freq: Two times a day (BID) | INTRAVENOUS | Status: DC
  Administered 2015-05-21 – 2015-05-24 (×7): 3.375 g via INTRAVENOUS
  Filled 2015-05-21 (×11): qty 50

## 2015-05-21 MED ORDER — FLUMAZENIL 0.5 MG/5ML IV SOLN
0.2000 mg | Freq: Once | INTRAVENOUS | Status: AC
  Administered 2015-05-21: 0.2 mg via INTRAVENOUS
  Filled 2015-05-21: qty 5

## 2015-05-21 MED ORDER — MIDAZOLAM HCL 2 MG/2ML IJ SOLN
2.0000 mg | INTRAMUSCULAR | Status: DC | PRN
  Filled 2015-05-21 (×3): qty 2

## 2015-05-21 MED ORDER — DOCUSATE SODIUM 50 MG/5ML PO LIQD: 100.0000 mg | Freq: Two times a day (BID) | ORAL | Status: DC | PRN

## 2015-05-21 MED ORDER — AMIODARONE LOAD VIA INFUSION
150.0000 mg | Freq: Once | INTRAVENOUS | Status: AC
  Administered 2015-05-21: 150 mg via INTRAVENOUS
  Filled 2015-05-21: qty 83.34

## 2015-05-21 MED ORDER — AMIODARONE HCL IN DEXTROSE 360-4.14 MG/200ML-% IV SOLN
30.0000 mg/h | INTRAVENOUS | Status: DC
  Administered 2015-05-21: 59.94 mg/h via INTRAVENOUS
  Administered 2015-05-22 – 2015-05-24 (×4): 30 mg/h via INTRAVENOUS
  Filled 2015-05-21 (×15): qty 200

## 2015-05-21 MED ORDER — STERILE WATER FOR INJECTION IJ SOLN
INTRAMUSCULAR | Status: AC
  Filled 2015-05-21: qty 20

## 2015-05-21 MED ORDER — MIDAZOLAM HCL 5 MG/5ML IJ SOLN
4.0000 mg | Freq: Once | INTRAMUSCULAR | Status: AC
  Administered 2015-05-21: 4 mg via INTRAVENOUS

## 2015-05-21 MED ORDER — FREE WATER
100.0000 mL | Freq: Three times a day (TID) | Status: DC
  Administered 2015-05-21 – 2015-05-24 (×9): 100 mL

## 2015-05-21 MED ORDER — FENTANYL BOLUS VIA INFUSION
50.0000 ug | INTRAVENOUS | Status: DC | PRN
  Administered 2015-05-22 – 2015-05-23 (×8): 50 ug via INTRAVENOUS
  Filled 2015-05-21: qty 50

## 2015-05-21 NOTE — Procedures (Signed)
  Procedure Note: Central Venous Catheter Placement (RIJ) Lavonna Monarch , [id number] , IC06A/IC06A-[id number]  Indications: Hemodynamic monitoring / Intravenous access  Benefits, risks (including bleeding, infection,  Injury, etc.), and alternatives explained to father who voiced understanding.   A time-out was completed verifying correct patient, procedure and site. A 3 lumen catheter available at the time of procedure.  The patient was placed in a dependent position appropriate for central line placement based on the vein to be cannulated.  The patient's RIGHT Internal Jugular Vein was prepped and draped in a sterile fashion. 1% Lidocaine WAS/WAS NOT used to anesthetize the surrounding skin area.  A 3 lumen catheter was introduced into the RIGHT Internal Jugular using Seldinger technique, visualized under ultrasound.  The catheter was threaded smoothly over the guide wire and appropriate blood return was obtained.  Each lumen of the catheter was evacuated of air and flushed with sterile saline.  The catheter was then sutured in place to the skin and a sterile dressing applied.  Perfusion to the extremity distal to the point of catheter insertion was checked and found to be adequate.    Chest X-ray was ordered for confirmation of placement.  The patient tolerated the procedure well and there were no complications.  Stephanie Acre, MD [facility] Pager [phone] (please enter 7-digits) On Call Pager - [phone] (please enter 7-digits)

## 2015-05-21 NOTE — Progress Notes (Signed)
Initial Nutrition Assessment    INTERVENTION:   Coordination of Care: received verbal order during ICU rounds to initiate TF once OG placed EN: recommend starting Vital High Protein at rate of 20 ml/hr with goal rate of 60 ml/hr with addition of Prostat BID providing 157 g of protein, 1640 kcals, 1210 mL of free water. Adult Tube Feeding Protocol initiated.   NUTRITION DIAGNOSIS:   Inadequate oral intake related to acute illness as evidenced by NPO status.  GOAL:   Provide needs based on ASPEN/SCCM guidelines  MONITOR:    (Energy Intake, Anthropometrics, Digestive System, Pulmonary, Electrolyte/Renal profile, Glucose Profile)  REASON FOR ASSESSMENT:   Consult, Ventilator Enteral/tube feeding initiation and management  ASSESSMENT:    Pt admitted with acute on chronic respiratory failure with COPD exacerbation, acute on chronic CHF; acute on CKD IV, started on temp dialysis after failing diuresis; pt intubated due to AMS and respiratory distress this AM after receiving ativan for agitation during dialysis  Past Medical History  Diagnosis Date  . Hypertension   . COPD (chronic obstructive pulmonary disease)   . Chronic kidney disease   . Chronic back pain greater than 3 months duration   . Phlebitis 1992    left calf  . Anxiety and depression   . CHF (congestive heart failure)   . Hepatitis B     Diet Order:  Diet NPO time specified   Energy Intake: pt eating 100% of meal trays up until last night  Electrolyte and Renal Profile:  Recent Labs Lab 05/19/15 1024 05/20/15 0428 05/20/15 1310 2015-06-07 0517  BUN  --  89* 70* 78*  CREATININE  --  4.76* 3.80* 4.39*  NA  --  135 137 137  K  --  5.8* 4.7 4.8  MG  --   --   --  2.5*  PHOS 8.3*  --   --  7.4*   Glucose Profile:   Recent Labs  07-Jun-2015 0016 2015/06/07 0427 06-07-15 0715  GLUCAP 139* 134* 132*   Nutritional Anemia Profile:  CBC Latest Ref Rng 06/07/15 05/20/2015 05/18/2015  WBC 3.8 - 10.6 K/uL 8.6  5.6 3.6(L)  Hemoglobin 13.0 - 18.0 g/dL 1.6(X) 10.1(L) 9.7(L)  Hematocrit 40.0 - 52.0 % 30.4(L) 31.4(L) 31.5(L)  Platelets 150 - 440 K/uL 85(L) 75(L) 68(L)    Gastrointestinal profile:  Ammonia 105  Meds: solumedrol, ss novolog, lasix, lactulose  Skin:  Reviewed, no issues  Last BM:  9/25  Height:   Ht Readings from Last 1 Encounters:  04/29/2015  (1.702 m)    Weight: per MD note, pt with at least 40 pounds of fluid weight. Estimated dry weight of 343 pounds (155.9 kg) based on this information  Wt Readings from Last 1 Encounters:  Jun 07, 2015 390 lb 10.5 oz (177.2 kg)    Ideal Body Weight:   67.3 kg  Filed Weights   05/20/15 1025 05/20/15 1300 Jun 07, 2015 0930  Weight: 385 lb 12.9 oz (175 kg) 383 lb 13.1 oz (174.1 kg) 390 lb 10.5 oz (177.2 kg)   Wt Readings from Last 10 Encounters:  05/20/15 383 lb 13.1 oz (174.1 kg)  05/13/15 364 lb (165.109 kg)  05/05/15 349 lb 12.8 oz (158.668 kg)  04/05/15 335 lb (151.955 kg)  01/03/15 349 lb (158.305 kg)  11/29/14 392 lb (177.81 kg)    BMI:  Body mass index is 61.17 kg/(m^2).  Estimated Nutritional Needs:   Kcal:  0960-4540 kcals (22-25 kcals/kg IBW per ASPEN guidelines for BMI >50)  Protein:  134-168 g (2.0-2.5 g/kg)   Fluid:  1000 mL plus UOP  HIGH Care Level  Romelle Starcher MS, RD, LDN [phone] Pager

## 2015-05-21 NOTE — Progress Notes (Signed)
eLink Physician-Brief Progress Note Patient Name: Gary Juarez DOB: Apr 15, 1961 MRN: [id number]   Date of Service  04/30/2015  HPI/Events of Note  Agonal, clinically acidotic on NIMV Unresponsive Needs emergent intubation  eICU Interventions  RN calling house MD and RT I have paged Dr Belia Heman to assess availability     Intervention Category Major Interventions: Airway management  Nelda Bucks. 05/15/2015, 4:31 AM

## 2015-05-21 NOTE — Progress Notes (Signed)
During time out procedure to intubate the patient the nursing staff pointed out that the patient was alert and agitated during dialysis. He was given Ativan after which he became progressively more lethargic. Because of decided to give the patient a trial of flumazenil. 2 doses given. Respiratory rate noted to have decreased with good waveform. Patient still moans. Observe for improvement. If none then will intubate.

## 2015-05-21 NOTE — Progress Notes (Signed)
Patient on Bipap with no improvement in mental status throughout the night.  Dr. Dema Severin in to assess and wanted to proceed with intubation.  Dr. Dema Severin attempted intubation with glidoscope times one, unsuccessful.  Patient has lots of secretions.  Anesthesia called, Dr. Pernell Dupre in to intubate.  Patient suctioned and intubated with Magrath.  Patient 7.5 ETT taped at 25 at lip, CXR pending.  Patient sats during intubation down to 60's but able to recover to 92%. Patient placed on vent, tolerating well.

## 2015-05-21 NOTE — Progress Notes (Signed)
Pre-hd tx 

## 2015-05-21 NOTE — Anesthesia Procedure Notes (Signed)
Procedure Name: Intubation Performed by: Yevette Edwards Pre-anesthesia Checklist: Suction available and Patient being monitored Comments: Upon arrival to icu 6, primary team attempting to ventilate with sats in the low 40s.  No evidence of good air exchange noted.  I took over bag valve mask ventilation with sats to the mid 70s but no better after 40seconds.  One look with the glidescope yielding no view of cords.  Transition to bag valve mask, sats 70s despite oral airway and nasal airway placement.  After 1 minute, decision to take an additional look with the Mcgrath yielding a good view of the cords.  7.5ett passed easily and + colorimeter and bs + right but neg left per Dr. Dema Severin.  CXR ordered and to be reviewed by primary team.  sats to 80s after intubation.

## 2015-05-21 NOTE — Anesthesia Preprocedure Evaluation (Addendum)
Anesthesia Evaluation  Preop documentation limited or incomplete due to emergent nature of procedure.  Airway        Dental   Pulmonary former smoker,  Called by icu team stat secondary to lost airway and inability to intubate.  No other hx known other than above as documentad          Cardiovascular hypertension, +CHF       Neuro/Psych    GI/Hepatic   Endo/Other    Renal/GU      Musculoskeletal   Abdominal   Peds  Hematology   Anesthesia Other Findings   Reproductive/Obstetrics                            Anesthesia Physical Anesthesia Plan  ASA: IV and emergent  Anesthesia Plan:    Post-op Pain Management:    Induction:   Airway Management Planned:   Additional Equipment:   Intra-op Plan:   Post-operative Plan:   Informed Consent:   Plan Discussed with:   Anesthesia Plan Comments:         Anesthesia Quick Evaluation

## 2015-05-21 NOTE — Procedures (Signed)
Date: 05/14/2015,     PHYSICIAN:  Vishal Mungal  Indications/Preliminary Diagnosis:   Consent: (Place X beside choice/s below)  The benefits, risks and possible complications of the procedure were        explained to:  ___ patient  ___ patient's family   ___ other:___________  who verbalized understanding and gave:  ___ verbal  ___ written  ___ verbal and written  ___ telephone  ___ other:________ consent.    x  Unable to obtain consent; procedure performed on emergent basis.     Other:       PRESEDATION ASSESSMENT: History and Physical has been performed. Patient meds and allergies have been reviewed. Presedation airway examination has been performed and documented. Baseline vital signs, sedation score, oxygenation status, and cardiac rhythm were reviewed. Patient was deemed to be in satisfactory condition to undergo the procedure.  PREMEDICATIONS: none, on precedex and fentanyl gtt  Sedative/Narcotic Amt Dose   Versed  mg   Fentanyl  mcg  Diprivan  mg        Airway Prep (Place X beside choice below)   1% Transtracheal Lidocaine Anesthetization 7 cc   Patient prepped per Bronchoscopy Lab Policy       Insertion Route (Place X beside choice below)   Nasal   Oral  x Endotracheal Tube   Tracheostomy   INTRAPROCEDURE MEDICATIONS:  Sedative/Narcotic Amt Dose   Versed  mg   Fentanyl  mcg  Diprivan  mg       Medication Amt Dose  Medication Amt Dose  Xylocaine 2%  cc  Epinephrine 1:10,000 sol  cc  Xylocaine 4%  cc  Cocaine  cc   TECHNICAL PROCEDURES: (Place X beside choice below)   Procedures  Description    None     Electrocautery     Cryotherapy     Balloon Dilatation     Bronchography     Stent Placement   x  Therapeutic Aspiration LUL, LL    Laser/Argon Plasma    Brachytherapy Catheter Placement    Foreign Body Removal     SPECIMENS (Sites): (Place X beside choice below)  Specimens Description   No Specimens Obtained     Washings   x Lavage BAL  LUL   Biopsies    Fine Needle Aspirates    Brushings    Sputum    FINDINGS:  Right Airways - no endobronchial lesion, thin secretions noted throughout Left Airways - LUL with moderate amount of blood tinged, thick secretions.  LLL with thin, blood tinged secretions. No endobronchial lesions.   ESTIMATED BLOOD LOSS: none  COMPLICATIONS/RESOLUTION: none  PROCEDURE DETAILS: Timeout performed and correct patient, name, & ID confirmed. Following prep per Pulmonary policy, appropriate sedation was administered.  Airway exam proceeded with findings, technical procedures, and specimen collection as noted below. At the end of exam the scope was withdrawn without incident. Impression and Plan as noted below.   Inspection: See above Procedure: BAL of the LUL ETT advanced to 27cm at the lip, noted to be about 4cm from the carina by bronchscopic visualization.   IMPLANTED DEVICE(S):none  IMPRESSION:POST-PROCEDURE DX: respiratory failure  RECOMMENDATION/PLAN:  Follow up BAL    ADDITIONAL COMMENTS:none   Procedure Time:approx 10 mins   Stephanie Acre, MD [facility] Pager : [phone] (Please enter 7 [phone])

## 2015-05-21 NOTE — Progress Notes (Signed)
Dr. Dema Severin and respiratory at bedside with intubation equipment. Intubation drugs pushed.

## 2015-05-21 NOTE — Progress Notes (Signed)
Post hd tx 

## 2015-05-21 NOTE — Consult Note (Signed)
PULMONARY / CRITICAL CARE MEDICINE   Name: Gary Juarez MRN: [id number] DOB: July 19, 1961    ADMISSION DATE:  05/06/2015 CONSULTATION DATE: 05/20/15  REFERRING MD :  Dr. Verdell Carmine   CHIEF COMPLAINT:     Short of breath.    HISTORY OF PRESENT ILLNESS  Patient unable to provide history due to lethargy and respiratory distress.  History per chart review and speaking with father  54 y.o. male with a known history of diastolic congestive heart failure, COPD and stage on oxygen, hepatitis B, chronic kidney disease stage IV, hypertension, presented with shortness of breath and weight gain on 05/02/2015. Per chart review he gained 10 pounds in 3 days. His nurse stopped visited him on 05/12/2015 and referred him into the hospital for heavy breathing. At the time of admission he felt tightness in his abdomen and also chest tightness. Not much cough. He does feel nauseous. Admitting physician stated per chart review that "talking with him he was shaking and very cold." Diuresis was attempted but unsuccessful, an HD cath was placed on 9/24.  On 9/25 temp HD was attempted but patient was noted to be agitated, he was given 21m IV ativan. Throughout the night, he was noted to be more lethargic and unresponsive, overnight MD, gave flumazenil, with very little improvement.  This morning, he was noted to be on bipap, with no alertness, heaving breathing, and in moderate respiratory distress.  His GCS <8      SIGNIFICANT EVENTS  9/24>>R fem HD cath placed by Surgery for temp HD.  9/26>>intubated, respiratory distress, anterior cords, thick secretions, intubated by anesthesia     PAST MEDICAL HISTORY    :  Past Medical History  Diagnosis Date  . Hypertension   . COPD (chronic obstructive pulmonary disease)   . Chronic kidney disease   . Chronic back pain greater than 3 months duration   . Phlebitis 1992    left calf  . Anxiety and depression   . CHF (congestive heart failure)   . Hepatitis B     Past Surgical History  Procedure Laterality Date  . Wisdom tooth extraction     Prior to Admission medications   Medication Sig Start Date End Date Taking? Authorizing Provider  albuterol (PROVENTIL HFA;VENTOLIN HFA) 108 (90 BASE) MCG/ACT inhaler Inhale 2 puffs into the lungs every 6 (six) hours as needed for wheezing or shortness of breath.   Yes Historical Provider, MD  amLODipine (NORVASC) 10 MG tablet Take 1 tablet (10 mg total) by mouth daily. 05/05/15  Yes RGladstone Lighter MD  aspirin EC 81 MG tablet Take 81 mg by mouth daily.   Yes Historical Provider, MD  atorvastatin (LIPITOR) 20 MG tablet Take 20 mg by mouth at bedtime.    Yes Historical Provider, MD  carvedilol (COREG) 12.5 MG tablet Take 12.5 mg by mouth 2 (two) times daily.   Yes Historical Provider, MD  clopidogrel (PLAVIX) 75 MG tablet Take 75 mg by mouth daily.   Yes Historical Provider, MD  Fluticasone-Salmeterol (ADVAIR DISKUS) 250-50 MCG/DOSE AEPB Inhale 1 puff into the lungs 2 (two) times daily. 05/05/15  Yes RGladstone Lighter MD  gabapentin (NEURONTIN) 300 MG capsule Take 300 mg by mouth 3 (three) times daily.   Yes Historical Provider, MD  hydrALAZINE (APRESOLINE) 25 MG tablet Take 1 tablet (25 mg total) by mouth every 8 (eight) hours. 05/05/15  Yes RGladstone Lighter MD  HYDROcodone-acetaminophen (NORCO/VICODIN) 5-325 MG per tablet Take 1 tablet by mouth every 6 (six) hours  as needed for moderate pain. 05/05/15  Yes Gladstone Lighter, MD  isosorbide mononitrate (IMDUR) 30 MG 24 hr tablet Take 30 mg by mouth daily.   Yes Historical Provider, MD  lactulose (CHRONULAC) 10 GM/15ML solution Take 45 mLs (30 g total) by mouth 2 (two) times daily. 05/05/15  Yes Gladstone Lighter, MD  nitroGLYCERIN (NITROSTAT) 0.4 MG SL tablet Place 1 tablet (0.4 mg total) under the tongue every 5 (five) minutes as needed for chest pain. 05/13/15  Yes Alisa Graff, FNP  pantoprazole (PROTONIX) 40 MG tablet Take 40 mg by mouth at bedtime.    Yes  Historical Provider, MD  potassium chloride SA (K-DUR,KLOR-CON) 20 MEQ tablet Take 1 tablet (20 mEq total) by mouth 2 (two) times daily. 05/13/15  Yes Alisa Graff, FNP  spironolactone (ALDACTONE) 25 MG tablet Take 25 mg by mouth daily.   Yes Historical Provider, MD  tiotropium (SPIRIVA) 18 MCG inhalation capsule Place 18 mcg into inhaler and inhale daily.   Yes Historical Provider, MD  torsemide (DEMADEX) 20 MG tablet Take 2 tablets (40 mg total) by mouth 2 (two) times daily. 05/13/15  Yes Alisa Graff, FNP  predniSONE (STERAPRED UNI-PAK 21 TAB) 10 MG (21) TBPK tablet Take 1 tablet (10 mg total) by mouth daily. 6 tabs PO x 1 day 5 tabs PO x 1 day 4 tabs PO x 1 day 3 tabs PO x 1 day 2 tabs PO x 1 day 1 tab PO x 1 day and stop Patient not taking: Reported on 05/16/2015 05/05/15   Gladstone Lighter, MD   Allergies  Allergen Reactions  . Morphine And Related Other (See Comments)    Reaction:  Headaches      FAMILY HISTORY   Family History  Problem Relation Age of Onset  . Stroke Mother   . Cancer Mother     Breast      SOCIAL HISTORY    reports that he quit smoking about 5 months ago. His smoking use included Cigarettes. He has a 5 pack-year smoking history. He has never used smokeless tobacco. He reports that he does not drink alcohol or use illicit drugs.  Review of Systems  Unable to perform ROS: critical illness      VITAL SIGNS    Temp:  [97.5 F (36.4 C)-100.1 F (37.8 C)] 97.9 F (36.6 C) (09/27 0200) Pulse Rate:  [50-84] 82 (09/27 0700) Resp:  [12-29] 28 (09/27 0700) BP: (97-181)/(53-89) 144/66 mmHg (09/27 0700) SpO2:  [89 %-96 %] 95 % (09/27 0700) FiO2 (%):  [45 %] 45 % (09/27 0419) Weight:  [383 lb 13.1 oz (174.1 kg)-385 lb 12.9 oz (175 kg)] 383 lb 13.1 oz (174.1 kg) (09/26 1300) HEMODYNAMICS:   VENTILATOR SETTINGS: Vent Mode:  [-]  FiO2 (%):  [45 %] 45 % INTAKE / OUTPUT:  Intake/Output Summary (Last 24 hours) at 05/16/2015 0747 Last data filed at  05/20/15 1841  Gross per 24 hour  Intake      0 ml  Output   1500 ml  Net  -1500 ml       PHYSICAL EXAM   Physical Exam  Constitutional: He appears well-developed. He appears distressed.  HENT:  Head: Normocephalic.  Eyes: Pupils are equal, round, and reactive to light.  Neck: Normal range of motion.  Cardiovascular: Normal rate, normal heart sounds and intact distal pulses.   Pulmonary/Chest: He is in respiratory distress. He has no wheezes. He has rales. He exhibits no tenderness.  Rapid breathing pattern,  on the bipap mask, unresponsive, not following commands.  Coarse upper airway sounds.  Dec breath sounds at the bases.   Abdominal: Soft. He exhibits no distension. There is no tenderness.  Musculoskeletal: He exhibits edema.  Generalized edema  Neurological:  Unable to perform CN exam. GCS<8 + spontaneous breathing  Skin: Skin is dry.  Dry scaled lesions (psoriasis appearance) on right leg       LABS   LABS:  CBC  Recent Labs Lab 05/18/15 0514 05/20/15 1224 05/20/2015 0517  WBC 3.6* 5.6 8.6  HGB 9.7* 10.1* 9.8*  HCT 31.5* 31.4* 30.4*  PLT 68* 75* 85*   Coag's No results for input(s): APTT, INR in the last 168 hours. BMET  Recent Labs Lab 05/20/15 0428 05/20/15 1310 05/22/2015 0517  NA 135 137 137  K 5.8* 4.7 4.8  CL 102 101 103  CO2 23 28 26   BUN 89* 70* 78*  CREATININE 4.76* 3.80* 4.39*  GLUCOSE 225* 164* 136*   Electrolytes  Recent Labs Lab 05/19/15 1024 05/20/15 0428 05/20/15 1310 05/03/2015 0517  CALCIUM  --  8.4* 8.2* 8.4*  PHOS 8.3*  --   --   --    Sepsis Markers No results for input(s): LATICACIDVEN, PROCALCITON, O2SATVEN in the last 168 hours. ABG  Recent Labs Lab 05/20/15 2005 05/20/15 2149 04/27/2015 0450  PHART 7.33* 7.35 7.38  PCO2ART 51* 47 44  PO2ART 75* 73* 80*   Liver Enzymes  Recent Labs Lab 05/07/2015 1402  AST 14*  ALT 15*  ALKPHOS 62  BILITOT 0.8  ALBUMIN 3.0*   Cardiac Enzymes  Recent Labs Lab  05/16/2015 1402  TROPONINI 0.03   Glucose  Recent Labs Lab 05/20/15 1633 05/20/15 1952 05/01/2015 0016 05/16/2015 0427 05/22/2015 0715  GLUCAP 180* 152* 139* 134* 132*     Recent Results (from the past 240 hour(s))  Culture, blood (routine x 2)     Status: None (Preliminary result)   Collection Time: 05/18/2015  4:10 PM  Result Value Ref Range Status   Specimen Description BLOOD RIGHT AC  Final   Special Requests BOTTLES DRAWN AEROBIC AND ANAEROBIC  [place]  Final   Culture NO GROWTH 3 DAYS  Final   Report Status PENDING  Incomplete  Culture, blood (routine x 2)     Status: None (Preliminary result)   Collection Time: 05/03/2015  4:18 PM  Result Value Ref Range Status   Specimen Description BLOOD LEFT HAND  Final   Special Requests BOTTLES DRAWN AEROBIC AND ANAEROBIC  4CC  Final   Culture NO GROWTH 3 DAYS  Final   Report Status PENDING  Incomplete  MRSA PCR Screening     Status: None   Collection Time: 05/20/15  1:53 PM  Result Value Ref Range Status   MRSA by PCR NEGATIVE NEGATIVE Final    Comment:        The GeneXpert MRSA Assay (FDA approved for NASAL specimens only), is one component of a comprehensive MRSA colonization surveillance program. It is not intended to diagnose MRSA infection nor to guide or monitor treatment for MRSA infections.      Current facility-administered medications:  .  0.9 %  sodium chloride infusion, 100 mL, Intravenous, PRN, Munsoor Lateef, MD .  0.9 %  sodium chloride infusion, 100 mL, Intravenous, PRN, Munsoor Lateef, MD .  albuterol (PROVENTIL) (2.5 MG/3ML) 0.083% nebulizer solution 2.5 mg, 2.5 mg, Nebulization, Q6H PRN, Loletha Grayer, MD .  alteplase (CATHFLO ACTIVASE) injection 2 mg, 2 mg, Intracatheter,  Once PRN, Munsoor Lateef, MD .  amLODipine (NORVASC) tablet 10 mg, 10 mg, Oral, Daily, Demetrios Loll, MD, 10 mg at 05/20/15 1400 .  aspirin EC tablet 81 mg, 81 mg, Oral, Daily, Loletha Grayer, MD, 81 mg at 05/18/15 1030 .  atorvastatin  (LIPITOR) tablet 20 mg, 20 mg, Oral, QHS, Loletha Grayer, MD, 20 mg at 05/19/15 2204 .  clopidogrel (PLAVIX) tablet 75 mg, 75 mg, Oral, Daily, Loletha Grayer, MD, 75 mg at 05/18/15 1030 .  fluticasone (FLONASE) 50 MCG/ACT nasal spray 2 spray, 2 spray, Each Nare, Daily, Demetrios Loll, MD, 2 spray at 05/19/15 1117 .  furosemide (LASIX) injection 80 mg, 80 mg, Intravenous, BID, Loletha Grayer, MD, 80 mg at 05/20/15 1727 .  heparin injection 1,000 Units, 1,000 Units, Intracatheter, PRN, Munsoor Lateef, MD, 2,600 Units at 05/20/15 1234 .  heparin injection 5,000 Units, 5,000 Units, Subcutaneous, 3 times per day, Loletha Grayer, MD, 5,000 Units at 05/20/15 2134 .  hydrALAZINE (APRESOLINE) injection 10 mg, 10 mg, Intravenous, Q6H PRN, Demetrios Loll, MD, 10 mg at 04/30/2015 0405 .  hydrALAZINE (APRESOLINE) tablet 25 mg, 25 mg, Oral, 3 times per day, Demetrios Loll, MD, 25 mg at 05/20/15 1400 .  HYDROcodone-acetaminophen (NORCO/VICODIN) 5-325 MG per tablet 1 tablet, 1 tablet, Oral, Q6H PRN, Loletha Grayer, MD, 1 tablet at 05/19/15 1953 .  insulin aspart (novoLOG) injection 2-6 Units, 2-6 Units, Subcutaneous, 6 times per day, Elsie Stain, MD, 2 Units at 05/01/2015 0530 .  ipratropium-albuterol (DUONEB) 0.5-2.5 (3) MG/3ML nebulizer solution 3 mL, 3 mL, Nebulization, Q4H, Elsie Stain, MD, 3 mL at 04/28/2015 0743 .  isosorbide mononitrate (IMDUR) 24 hr tablet 30 mg, 30 mg, Oral, Daily, Loletha Grayer, MD, 30 mg at 05/19/15 1035 .  lactulose (CHRONULAC) 10 GM/15ML solution 30 g, 30 g, Oral, BID, Loletha Grayer, MD, 30 g at 05/19/15 2205 .  lidocaine (PF) (XYLOCAINE) 1 % injection 5 mL, 5 mL, Intradermal, PRN, Munsoor Lateef, MD .  lidocaine-prilocaine (EMLA) cream 1 application, 1 application, Topical, PRN, Munsoor Lateef, MD .  methylPREDNISolone sodium succinate (SOLU-MEDROL) 125 mg/2 mL injection 60 mg, 60 mg, Intravenous, Daily, Demetrios Loll, MD, 60 mg at 05/20/15 1352 .  nitroGLYCERIN (NITROSTAT) SL tablet 0.4 mg,  0.4 mg, Sublingual, Q5 min PRN, Loletha Grayer, MD .  pantoprazole (PROTONIX) injection 40 mg, 40 mg, Intravenous, Q24H, Elsie Stain, MD, 40 mg at 05/20/15 2134 .  pentafluoroprop-tetrafluoroeth (GEBAUERS) aerosol 1 application, 1 application, Topical, PRN, Munsoor Lateef, MD .  sodium chloride 0.9 % injection 3 mL, 3 mL, Intravenous, PRN, Demetrios Loll, MD .  sodium chloride 0.9 % injection 3 mL, 3 mL, Intravenous, Q12H, Demetrios Loll, MD, 3 mL at 05/20/15 2134  IMAGING    Dg Chest Port 1 View  05/20/2015   CLINICAL DATA:  Hypoxia and altered mental status today.  EXAM: PORTABLE CHEST 1 VIEW  COMPARISON:  May 17, 2015  FINDINGS: The heart size and mediastinal contours are stable. The heart size is enlarged. There is pulmonary edema. There is no focal pneumonia. The visualized skeletal structures are stable.  IMPRESSION: Congestive heart failure.   Electronically Signed   By: Abelardo Diesel M.D.   On: 05/20/2015 21:48      Indwelling Urinary Catheter continued, requirement due to   Reason to continue Indwelling Urinary Catheter for strict Intake/Output monitoring for hemodynamic instability   Central Line continued, requirement due to   Reason to continue [employer] Monitoring of central venous pressure or other hemodynamic parameters  Ventilator continued, requirement due to, resp failure    Ventilator Sedation RASS 0 to -2      ASSESSMENT/PLAN  54 yo male with CKD, dCHF, COPD on supplemental O2, now with acute respiratory failure on 9/26, intubated.   PULMONARY Acute respiratory failure Requirement for MV Mixed Resp and met acidosis Encephalopathy P:   - cont with MV, wean as tolerated - prn ABG - CXR stat - thick secretions noted during intubation, dec BS on the L side, will plan for bronchoscopy.  - Bal, resp culture  CARDIOVASCULAR dchf hypertension P:  - plan for temp HD - cont with fluid restriction and monitoring - cardiology following - plan for  cardiac cath but patient exceeds the [facility] cath table weight limit, cardiology recommending transfer to another facility once respiratory status is more stable for cath.  - monitor BP  RENAL Met acidosis CKD Oliguria Acute renal failure P:   -plan for temp HD -electrolyte replacement during dialysis  GASTROINTESTINAL PPI prophylaxis OGT   INFECTIOUS Empiric abx with vanc and zosyn Current cultures negative to date  P:   BCx2 - neg x 3 days UC - neg BAL- pending Abx: vanc/zosyn (renally dose), start date9/26  ENDOCRINE ICU hypo/hyperglycemic protocol P:   -ssi  NEUROLOGIC A:   Encephalopathy - metabolic Lethargy unresponsiveness  P:   - correct electrolytes - treat any underlying infection. - hold gabapentin -RASS goal: -1  SOCIAL  - I have spoken to the patient's father (HCPOA). Father stated that patient does not have any other family (no children, never married). Father updated on current clinical status.  Father stated that patient is a FULL CODE, and he will discuss goals of care with palliative care.     I have personally obtained a history, examined the patient, evaluated laboratory and imaging results, formulated the assessment and plan and placed orders.  The Patient requires high complexity decision making for assessment and support, frequent evaluation and titration of therapies, application of advanced monitoring technologies and extensive interpretation of multiple databases. Critical Care Time devoted to patient care services described in this note is 65 minutes.   Overall, patient is critically ill, prognosis is guarded. Patient at high risk for cardiac arrest and death.   Vilinda Boehringer, MD [facility] Pager [phone] (please enter 7-digits) On Call Pager - [phone] (please enter 7-digits)     04/29/2015, 7:47 AM

## 2015-05-21 NOTE — Progress Notes (Signed)
Notified by ICU staff that the eICU on call recommended intubation of the patient for respiratory failure. Examined patient at bedside. Respiratory rate is 27-30 with oxygen saturations 95% on FiO2 0.45. Chest x-ray reviewed which shows pulmonary edema marginally worse today than on previous study. Patient is lethargic and barely responsive but audibly moans at times. ABGs are essentially normal.  Concern is that the examiner as well as ICU nursing staff is aware the patient has said in the past that he did not want intubation. However, there is no living will. Given the nonemergent nature of the patient's impending respiratory failure I decided to call the patient's father to discuss his wishes. I explained to him the patient's downward trend in respiratory status but otherwise stable condition. I also explained that the patient's severe COPD may make extubation difficult if not impossible at some point in the future. The patient's father states that he is unaware of his sons wishes. I presented the scenario of future inability to wean from mechanical ventilation which may or may not obligate the father to make the decision to withdraw care. He initially stated that he was unwilling to make that decision. I informed him that from a strictly objective medical standpoint he may eventually need intubation to prolong his patient's life. His father informed me that he will support our decision and that we should do what's best. We mutually agreed to intubate the patient if necessary and that I would keep him updated on the patient's condition.

## 2015-05-21 NOTE — Progress Notes (Signed)
This note also relates to the following rows which could not be included: Resp - Cannot attach notes to unvalidated device data SpO2 - Cannot attach notes to unvalidated device data [place] CO2 (EtCO2) - Cannot attach notes to unvalidated device data   Post hd tx

## 2015-05-21 NOTE — Procedures (Signed)
Procedure Note:  Orotracheal Intubation by Anesthesia Total attempts by CCM=1  Implied consent due to emergent nature of patient's condition. Correct Patient, Name & ID confirmed.  The patient was pre-oxygenated and then, under direct visualization, a the glidescope was inserted orally.  Total attempts made 1, using Glidescope, the vocal cords could not be completely visualized, it was very anterior, there was a large amount of thick/purulent secretions, the patient acutely desaturated to 64%, procedure aborted and anesthesia was called emergently.  Patient was bagged back up to the mid 80s, head of bed elevated, oral airway and nasal trumpet placed, anesthesia was able to to intubate the patient with a McGraph scope, during intubation an assistant applied gentle pressure to the cricoid cartilage.  Position confirmed by auscultation of lungs (good breath sounds bilaterally) and no stomach sounds.  Tube secured at 25cm. Pulse ox 92 %. CO2 detector in place with appropriate color change.     CXR ordered.   Stephanie Acre, MD [facility] Pager [phone] On Call Pager [phone]

## 2015-05-21 NOTE — Progress Notes (Signed)
Subjective:   Was started on temporary dialysis after failure of diuretic therapy 1st HD done on 9/25 (sunday) Patient was agitated prior to and during dialysis. He was cursing and trying to get out of bed during the treatment. Due to risk of pulling out the cathter he was given ativan iv x 2 mg, which calmed him some. After the treatment he remained agitated and then became excessively somnolent. ABG was done. He was placed on BiPAP This morning he is undergoing intubation due to altered mental status and respiratory distress  Objective:  Vital signs in last 24 hours:  Temp:  [97.5 F (36.4 C)-100.1 F (37.8 C)] 98.7 F (37.1 C) (09/27 0800) Pulse Rate:  [50-85] 85 (09/27 0800) Resp:  [12-32] 28 (09/27 0800) BP: (97-181)/(53-89) 169/75 mmHg (09/27 0800) SpO2:  [89 %-96 %] 92 % (09/27 0800) FiO2 (%):  [45 %] 45 % (09/27 0743) Weight:  [174.1 kg (383 lb 13.1 oz)-175 kg (385 lb 12.9 oz)] 174.1 kg (383 lb 13.1 oz) (09/26 1300)  Weight change:  Filed Weights   05/12/2015 1742 05/20/15 1025 05/20/15 1300  Weight: 174.317 kg (384 lb 4.8 oz) 175 kg (385 lb 12.9 oz) 174.1 kg (383 lb 13.1 oz)    Intake/Output: I/O last 3 completed shifts: In: 250 [P.O.:250] Out: 1500 [Other:1500]   Intake/Output this shift:     Physical Exam: General: morbidly obese NAD,  Critically ill appearing  Head: Normocephalic, atraumatic. Moist oral mucosal membranes  E/ENT: Anicteric, moist oral mucus membranes  Neck: Supple, trachea midline  Lungs:  diffuse bilateral rales normal effort  Heart: S1S2 no rubs  Abdomen:  Soft, nontender, obese, lower abdominal wall edema noted  Extremities: 3+ b/l LE edema, rt fem temp cath  Neurologic: Not answering Qs or following commands, not responsive  Skin: No lesions       Basic Metabolic Panel:  Recent Labs Lab 05/18/15 2048 05/19/15 0442 05/19/15 1024 05/20/15 0428 05/20/15 1310 04/27/2015 0517  NA 134* 134*  --  135 137 137  K 5.4* 5.2*  --  5.8* 4.7  4.8  CL 103 102  --  102 101 103  CO2 22 23  --  GLUCOSE 268* 212*  --  225* 164* 136*  BUN 89* 89*  --  89* 70* 78*  CREATININE 4.64* 4.89*  --  4.76* 3.80* 4.39*  CALCIUM 8.1* 8.2*  --  8.4* 8.2* 8.4*  PHOS  --   --  8.3*  --   --   --     Liver Function Tests:  Recent Labs Lab 05/05/2015 1402  AST 14*  ALT 15*  ALKPHOS 62  BILITOT 0.8  PROT 6.2*  ALBUMIN 3.0*   No results for input(s): LIPASE, AMYLASE in the last 168 hours.  Recent Labs Lab 05/20/2015 0517  AMMONIA 105*    CBC:  Recent Labs Lab 05/08/2015 1402 05/18/15 0514 05/20/15 1224 05/24/2015 0517  WBC 4.5 3.6* 5.6 8.6  HGB 9.9* 9.7* 10.1* 9.8*  HCT 31.5* 31.5* 31.4* 30.4*  MCV 85.4 85.7 83.7 82.6  PLT 67* 68* 75* 85*    Cardiac Enzymes:  Recent Labs Lab 05/01/2015 1402  TROPONINI 0.03    BNP: Invalid input(s): POCBNP  CBG:  Recent Labs Lab 05/20/15 1633 05/20/15 1952 04/30/2015 0016 04/30/2015 0427 05/05/2015 0715  GLUCAP 180* 152* 139* 134* 132*    Microbiology: Results for orders placed or performed during the hospital encounter of 04/25/2015  Culture, blood (routine x 2)  Status: None (Preliminary result)   Collection Time: 04/26/2015  4:10 PM  Result Value Ref Range Status   Specimen Description BLOOD RIGHT AC  Final   Special Requests BOTTLES DRAWN AEROBIC AND ANAEROBIC  9CC  Final   Culture NO GROWTH 3 DAYS  Final   Report Status PENDING  Incomplete  Culture, blood (routine x 2)     Status: None (Preliminary result)   Collection Time: 05/06/2015  4:18 PM  Result Value Ref Range Status   Specimen Description BLOOD LEFT HAND  Final   Special Requests BOTTLES DRAWN AEROBIC AND ANAEROBIC  4CC  Final   Culture NO GROWTH 3 DAYS  Final   Report Status PENDING  Incomplete  MRSA PCR Screening     Status: None   Collection Time: 05/20/15  1:53 PM  Result Value Ref Range Status   MRSA by PCR NEGATIVE NEGATIVE Final    Comment:        The GeneXpert MRSA Assay (FDA approved for NASAL  specimens only), is one component of a comprehensive MRSA colonization surveillance program. It is not intended to diagnose MRSA infection nor to guide or monitor treatment for MRSA infections.     Coagulation Studies: No results for input(s): LABPROT, INR in the last 72 hours.  Urinalysis: No results for input(s): COLORURINE, LABSPEC, PHURINE, GLUCOSEU, HGBUR, BILIRUBINUR, KETONESUR, PROTEINUR, UROBILINOGEN, NITRITE, LEUKOCYTESUR in the last 72 hours.  Invalid input(s): APPERANCEUR    Imaging: Dg Chest Port 1 View  05/20/2015   CLINICAL DATA:  Hypoxia and altered mental status today.  EXAM: PORTABLE CHEST 1 VIEW  COMPARISON:  May 17, 2015  FINDINGS: The heart size and mediastinal contours are stable. The heart size is enlarged. There is pulmonary edema. There is no focal pneumonia. The visualized skeletal structures are stable.  IMPRESSION: Congestive heart failure.   Electronically Signed   By: Sherian Rein M.D.   On: 05/20/2015 21:48     Medications:   . dexmedetomidine    . fentaNYL infusion INTRAVENOUS     . amLODipine  10 mg Oral Daily  . antiseptic oral rinse  7 mL Mouth Rinse QID  . aspirin EC  81 mg Oral Daily  . atorvastatin  20 mg Oral QHS  . chlorhexidine gluconate  15 mL Mouth Rinse BID  . clopidogrel  75 mg Oral Daily  . fentaNYL      . fentaNYL (SUBLIMAZE) injection  200 mcg Intravenous Once  . fentaNYL (SUBLIMAZE) injection  50 mcg Intravenous Once  . fluticasone  2 spray Each Nare Daily  . furosemide  80 mg Intravenous BID  . heparin  5,000 Units Subcutaneous 3 times per day  . hydrALAZINE  25 mg Oral 3 times per day  . insulin aspart  2-6 Units Subcutaneous 6 times per day  . ipratropium-albuterol  3 mL Nebulization Q4H  . isosorbide mononitrate  30 mg Oral Daily  . lactulose  30 g Oral BID  . methylPREDNISolone (SOLU-MEDROL) injection  60 mg Intravenous Daily  . midazolam      . midazolam  4 mg Intravenous Once  . pantoprazole (PROTONIX) IV   40 mg Intravenous Q24H  . sodium chloride  3 mL Intravenous Q12H  . sterile water (preservative free)      . vecuronium      . vecuronium  20 mg Intravenous Once   sodium chloride, sodium chloride, albuterol, alteplase, docusate, fentaNYL, heparin, hydrALAZINE, HYDROcodone-acetaminophen, lidocaine (PF), lidocaine-prilocaine, nitroGLYCERIN, pentafluoroprop-tetrafluoroeth, sodium chloride  Assessment/ Plan:  Gary Juarez is a 54 y.o.  white male with hepatits B, hypertension, COPD, anemia, thrombocytopenia, morbid obesity, tobacco abuse.  1. 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 Creatinine baseline of 3.5 from 9/16.  Acute renal failure due to acute exacerbation of diastolic heart failure. - Renal function remains poor worse, pt also with  massive volume overload.   - started on HD. 1st treatment was on 9/25   - plan for urgent session of HD today for volume removal - hold gabapentin - may be causing somnolence. May restart after mental status normalized , use renally adjusted dose of 300 mg po qhs  2. Pulmonary edema and Acute diastolic heart failure:  Significant weight gain noted in a short period of time, lasix doesn't appear to be fully effective, will opt for HD for now.  3.  Massive generalized edema R60.1:  Significant edema noted, pt likely has at least 40lbs of fluid overload.  Will plan for HD, may need daily HD for a bit.    LOS: 4 Devonia Farro 9/27/20168:13 AM

## 2015-05-21 NOTE — Progress Notes (Signed)
Dr. Welton Flakes notified via telephone that patient has converted to A.fib. Order for amio gtt per protocol. Aware that patient will not have access for this drip to run until about 1pm after HD is finished.

## 2015-05-21 NOTE — Progress Notes (Signed)
SUBJECTIVE: Pt intubated and sedated. HR under good control.    Filed Vitals:   Jun 18, 2015 0700 June 18, 2015 0743 06/18/2015 0745 Jun 18, 2015 0800  BP: 144/66   169/75  Pulse: 82  81 85  Temp:    98.7 F (37.1 C)  TempSrc:    Oral  Resp: 28  32 28  Height:      Weight:      SpO2: 95% 94% 95% 92%    Intake/Output Summary (Last 24 hours) at 06-18-2015 0853 Last data filed at 05/20/15 1841  Gross per 24 hour  Intake      0 ml  Output   1500 ml  Net  -1500 ml    LABS: Basic Metabolic Panel:  Recent Labs  16/10/96 1024  05/20/15 1310 06-18-15 0517  NA  --   < > 137 137  K  --   < > 4.7 4.8  CL  --   < > 101 103  CO2  --   < > 28 26  GLUCOSE  --   < > 164* 136*  BUN  --   < > 70* 78*  CREATININE  --   < > 3.80* 4.39*  CALCIUM  --   < > 8.2* 8.4*  PHOS 8.3*  --   --   --   < > = values in this interval not displayed. Liver Function Tests: No results for input(s): AST, ALT, ALKPHOS, BILITOT, PROT, ALBUMIN in the last 72 hours. No results for input(s): LIPASE, AMYLASE in the last 72 hours. CBC:  Recent Labs  05/20/15 1224 06/18/15 0517  WBC 5.6 8.6  HGB 10.1* 9.8*  HCT 31.4* 30.4*  MCV 83.7 82.6  PLT 75* 85*   Cardiac Enzymes: No results for input(s): CKTOTAL, CKMB, CKMBINDEX, TROPONINI in the last 72 hours. BNP: Invalid input(s): POCBNP D-Dimer: No results for input(s): DDIMER in the last 72 hours. Hemoglobin A1C:  Recent Labs  05/20/15 1224  HGBA1C 5.7   Fasting Lipid Panel: No results for input(s): CHOL, HDL, LDLCALC, TRIG, CHOLHDL, LDLDIRECT in the last 72 hours. Thyroid Function Tests: No results for input(s): TSH, T4TOTAL, T3FREE, THYROIDAB in the last 72 hours.  Invalid input(s): FREET3 Anemia Panel: No results for input(s): VITAMINB12, FOLATE, FERRITIN, TIBC, IRON, RETICCTPCT in the last 72 hours.   PHYSICAL EXAM General: Obese, disheveled  HEENT: normal Neck: supple. no JVD. Carotids 2+ bilat; no bruits. No lymphadenopathy or thryomegaly  appreciated. Cor: PMI nondisplaced. bradycardic & normal rhythm. No rubs, gallops or murmurs. Lungs: decreased breath sounds b/l. Mild wheezes  Abdomen: soft, nontender, nondistended. No hepatosplenomegaly. No bruits or masses. Good bowel sounds. Extremities: no cyanosis, clubbing, rash. 2+ pedal edema b/l Neuro: alert & oriented x 3, cranial nerves grossly intact. moves all 4 extremities w/o difficulty.  TELEMETRY: Reviewed telemetry pt in NSR  ASSESSMENT AND PLAN: acute on chronic diastolic dysfunction 2/2 fluid overload. Pt not intubated and sedated 2/2 respiratory failure. Planned for cardiac cath today, but pt weighs too much for [facility] cath table. Once pts respiratory status is stable, consider transfer to [facility] as their table has higher weight limit.   Active Problems:   Acute on chronic respiratory failure with hypoxia    Laurier Nancy, MD, [facility] 06-18-2015 8:53 AM

## 2015-05-21 NOTE — Care Management Note (Signed)
I am monitoring patient for possible need for continued dialysis needs>  Gathering medical records in case out patient dialysis is needed at discharge.  At this time patient has no insurance coverage but does have a Medicaid pending application.   Ivor Reining  Dialysis Liaison [phone]

## 2015-05-21 NOTE — Progress Notes (Signed)
[facility] - [place] at [facility]   PATIENT NAME: Zackaria Burkey    MR#:  [id number]  DATE OF BIRTH:  Dec 14, 1960  SUBJECTIVE:  CHIEF COMPLAINT:   Chief Complaint  Patient presents with  . Shortness of Breath   Unresponsive to stimuli, on BIPAP with respiratory distress  REVIEW OF SYSTEMS:  Unable to get a ROS due to confusion.  DRUG ALLERGIES:   Allergies  Allergen Reactions  . Morphine And Related Other (See Comments)    Reaction:  Headaches     VITALS:  Blood pressure 169/75, pulse 85, temperature 98.7 F (37.1 C), temperature source Oral, resp. rate 28, height  (1.702 m), weight 174.1 kg (383 lb 13.1 oz), SpO2 92 %.  PHYSICAL EXAMINATION:  GENERAL:  54 y.o.-year-old patient lying in the bed with no acute distress. Morbidly obese. EYES: Pupils equal, round, reactive to light and accommodation. No scleral icterus. Extraocular muscles intact.  HEENT: Head atraumatic, normocephalic. Oropharynx and nasopharynx clear. Moist oral mucosa. NECK:  Supple, no jugular venous distention. No thyroid enlargement, no tenderness.  LUNGS: Weak breath sounds bilaterally, no wheezing, bilateral crackles and rhonchi. Use of accessory muscles of respiration.  CARDIOVASCULAR: S1, S2 normal. No murmurs, rubs, or gallops.  ABDOMEN: Soft, nontender, nondistended. Bowel sounds present. Abdominal wall edema 1+. EXTREMITIES: Bilateral lower extremity edema 2+, no cyanosis, or clubbing.  NEUROLOGIC: Confused, unable to exam. PSYCHIATRIC: The patient is confused. SKIN: No obvious rash, lesion, or ulcer.    LABORATORY PANEL:   CBC  Recent Labs Lab 05/09/2015 0517  WBC 8.6  HGB 9.8*  HCT 30.4*  PLT 85*   ------------------------------------------------------------------------------------------------------------------  Chemistries   Recent Labs Lab 2015/06/02 1402  05/22/2015 0517  NA 135  < > 137  K 5.0  < > 4.8  CL 105  < > 103  CO2 22  < > 26  GLUCOSE  181*  < > 136*  BUN 76*  < > 78*  CREATININE 4.21*  < > 4.39*  CALCIUM 8.0*  < > 8.4*  AST 14*  --   --   ALT 15*  --   --   ALKPHOS 62  --   --   BILITOT 0.8  --   --   < > = values in this interval not displayed. ------------------------------------------------------------------------------------------------------------------  Cardiac Enzymes  Recent Labs Lab 06-02-15 1402  TROPONINI 0.03   ------------------------------------------------------------------------------------------------------------------  RADIOLOGY:  Dg Chest Port 1 View  05/20/2015   CLINICAL DATA:  Hypoxia and altered mental status today.  EXAM: PORTABLE CHEST 1 VIEW  COMPARISON:  06/02/15  FINDINGS: The heart size and mediastinal contours are stable. The heart size is enlarged. There is pulmonary edema. There is no focal pneumonia. The visualized skeletal structures are stable.  IMPRESSION: Congestive heart failure.   Electronically Signed   By: Sherian Rein M.D.   On: 05/20/2015 21:48    EKG:   Orders placed or performed during the hospital encounter of 02-Jun-2015  . ED EKG  . ED EKG    ASSESSMENT AND PLAN:   1. Acute on chronic respiratory failure with hypoxia and hypercapenia.  Continue BIPAP, may need intubation prn. Follow up pulmonary physician.  2. COPD exacerbation. Taper IV Solu-Medrol, Levaquin, budesonide nebulizer, Spiriva and DuoNeb nebulizers.  3. Acute on chronic diastolic congestive heart failure. Continue hemodialysis today, On Lasix 80 mg IV twice a day. Coreg was on hold secondary to bradycardia. Bradycardia resolved.  hydralazine and norvasc were on  hold secondary to relative hypotension. Blood pressure is high, resumed hydralazine and norvasc. Resume coreg.  4. 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 Oliguria  5. Morbid obesity and likely sleep apnea 6. History of hepatitis B and  liver disease- on lactulose 7. Chronic back pain on chronic pain medications.  Hyperkalemia. Hold spironolactone, improved after hemodialysis yesterday. Follow up BMP.  Chronic thrombocytopenia. Stable.   Altered mental status due to acute metabolic encephalopathy,  due to respiratory failure/renal failure/med. Fall and aspiration precaution.  I discussed with Dr. Thedore Mins. All the records are reviewed and case discussed with Care Management/Social Workerr. Management plans discussed with the patient, his father and they are in agreement.  CODE STATUS: Full code  TOTAL CRITICAL TIME TAKING CARE OF THIS PATIENT: 46 minutes.   POSSIBLE D/C IN >3 DAYS, DEPENDING ON CLINICAL CONDITION.   Shaune Pollack M.D on 05/08/2015 at 8:09 AM  Between 7am to 6pm - Pager - [phone]  After 6pm go to [email or web address] - password [email or web address] [facility]  [place] [facility]  Office  [phone]  CC: Primary care physician; Verlee Monte, PA-C

## 2015-05-21 NOTE — Progress Notes (Signed)
Tx completed

## 2015-05-21 NOTE — Progress Notes (Signed)
ANTIBIOTIC CONSULT NOTE - INITIAL  Pharmacy Consult for Vancomycin/Zosyn Indication: rule out sepsis  Allergies  Allergen Reactions  . Morphine And Related Other (See Comments)    Reaction:  Headaches     Patient Measurements: Height:  (170.2 cm) Weight: (!) 383 lb 13.1 oz (174.1 kg) IBW/kg (Calculated) : 66.1 Adjusted Body Weight: 109.3 kg  Vital Signs: Temp: 98.7 F (37.1 C) (09/27 0800) Temp Source: Oral (09/27 0800) BP: 169/75 mmHg (09/27 0800) Pulse Rate: 85 (09/27 0800) Intake/Output from previous day: 09/26 0701 - 09/27 0700 In: -  Out: 1500  Intake/Output from this shift:    Labs:  Recent Labs  05/20/15 0428 05/20/15 1224 05/20/15 1310 27-May-2015 0517  WBC  --  5.6  --  8.6  HGB  --  10.1*  --  9.8*  PLT  --  75*  --  85*  CREATININE 4.76*  --  3.80* 4.39*   Estimated Creatinine Clearance: 29.4 mL/min (by C-G formula based on Cr of 4.39). No results for input(s): VANCOTROUGH, VANCOPEAK, VANCORANDOM, GENTTROUGH, GENTPEAK, GENTRANDOM, TOBRATROUGH, TOBRAPEAK, TOBRARND, AMIKACINPEAK, AMIKACINTROU, AMIKACIN in the last 72 hours.   Microbiology: Recent Results (from the past 720 hour(s))  MRSA PCR Screening     Status: None   Collection Time: 05/01/15  9:42 PM  Result Value Ref Range Status   MRSA by PCR NEGATIVE NEGATIVE Final    Comment:        The GeneXpert MRSA Assay (FDA approved for NASAL specimens only), is one component of a comprehensive MRSA colonization surveillance program. It is not intended to diagnose MRSA infection nor to guide or monitor treatment for MRSA infections.   Culture, blood (routine x 2)     Status: None (Preliminary result)   Collection Time: 05/09/2015  4:10 PM  Result Value Ref Range Status   Specimen Description BLOOD RIGHT AC  Final   Special Requests BOTTLES DRAWN AEROBIC AND ANAEROBIC  9CC  Final   Culture NO GROWTH 3 DAYS  Final   Report Status PENDING  Incomplete  Culture, blood (routine x 2)     Status:  None (Preliminary result)   Collection Time: 05/08/2015  4:18 PM  Result Value Ref Range Status   Specimen Description BLOOD LEFT HAND  Final   Special Requests BOTTLES DRAWN AEROBIC AND ANAEROBIC  4CC  Final   Culture NO GROWTH 3 DAYS  Final   Report Status PENDING  Incomplete  MRSA PCR Screening     Status: None   Collection Time: 05/20/15  1:53 PM  Result Value Ref Range Status   MRSA by PCR NEGATIVE NEGATIVE Final    Comment:        The GeneXpert MRSA Assay (FDA approved for NASAL specimens only), is one component of a comprehensive MRSA colonization surveillance program. It is not intended to diagnose MRSA infection nor to guide or monitor treatment for MRSA infections.     Medical History: Past Medical History  Diagnosis Date  . Hypertension   . COPD (chronic obstructive pulmonary disease)   . Chronic kidney disease   . Chronic back pain greater than 3 months duration   . Phlebitis 1992    left calf  . Anxiety and depression   . CHF (congestive heart failure)   . Hepatitis B     Medications:  Scheduled:  . amLODipine  10 mg Oral Daily  . antiseptic oral rinse  7 mL Mouth Rinse QID  . aspirin EC  81 mg Oral  Daily  . atorvastatin  20 mg Oral QHS  . carvedilol  3.125 mg Oral BID WC  . chlorhexidine gluconate  15 mL Mouth Rinse BID  . clopidogrel  75 mg Oral Daily  . fluticasone  2 spray Each Nare Daily  . furosemide  80 mg Intravenous BID  . heparin  5,000 Units Subcutaneous 3 times per day  . hydrALAZINE  25 mg Oral 3 times per day  . insulin aspart  2-6 Units Subcutaneous 6 times per day  . ipratropium-albuterol  3 mL Nebulization Q4H  . isosorbide mononitrate  30 mg Oral Daily  . lactulose  30 g Oral BID  . methylPREDNISolone (SOLU-MEDROL) injection  60 mg Intravenous Daily  . pantoprazole (PROTONIX) IV  40 mg Intravenous Q24H  . piperacillin-tazobactam (ZOSYN)  IV  3.375 g Intravenous Q12H  . sodium chloride  3 mL Intravenous Q12H  . sterile water  (preservative free)      . vancomycin  2,500 mg Intravenous Once   Infusions:  . dexmedetomidine 0.2 mcg/kg/hr (05/29/2015 0901)  . fentaNYL infusion INTRAVENOUS 100 mcg/hr (06/08/2015 0917)   PRN: sodium chloride, sodium chloride, albuterol, alteplase, docusate, fentaNYL, heparin, hydrALAZINE, HYDROcodone-acetaminophen, lidocaine (PF), lidocaine-prilocaine, nitroGLYCERIN, pentafluoroprop-tetrafluoroeth, sodium chloride  Assessment: 54 y/o M with CKD requiring HD this admission ordered empiric abx for presumed sepsis.   Goal of Therapy:  Pre-HD vancomycin level: 15-25, post-HD vancomycin level: 5-15  Plan:  Will order vancomycin 2500 mg iv once. Will need to f/u plans for HD and order scheduled vancomycin dose. For now, will order vancomycin 1000 mg iv prn HD.. Will plan to check a vancomycin trough with the 3rd dialysis session. Will begin Zosyn 3.375 g EI q 12 hours.   Gary Juarez D 06/20/2015,9:49 AM

## 2015-05-21 NOTE — Progress Notes (Signed)
HD tx start 

## 2015-05-21 NOTE — Plan of Care (Signed)
Problem: Phase I Progression Outcomes Goal: Dyspnea controlled at rest (HF) Outcome: Not Progressing Pt on bipap unable to maintain O2 saturations without it. Very lethargic.

## 2015-05-22 ENCOUNTER — Inpatient Hospital Stay: Payer: Self-pay

## 2015-05-22 ENCOUNTER — Inpatient Hospital Stay: Payer: MEDICAID

## 2015-05-22 DIAGNOSIS — E785 Hyperlipidemia, unspecified: Secondary | ICD-10-CM

## 2015-05-22 DIAGNOSIS — I2510 Atherosclerotic heart disease of native coronary artery without angina pectoris: Secondary | ICD-10-CM

## 2015-05-22 DIAGNOSIS — F418 Other specified anxiety disorders: Secondary | ICD-10-CM

## 2015-05-22 DIAGNOSIS — I5031 Acute diastolic (congestive) heart failure: Secondary | ICD-10-CM

## 2015-05-22 DIAGNOSIS — N184 Chronic kidney disease, stage 4 (severe): Secondary | ICD-10-CM

## 2015-05-22 DIAGNOSIS — Z992 Dependence on renal dialysis: Secondary | ICD-10-CM

## 2015-05-22 DIAGNOSIS — J441 Chronic obstructive pulmonary disease with (acute) exacerbation: Secondary | ICD-10-CM

## 2015-05-22 DIAGNOSIS — D696 Thrombocytopenia, unspecified: Secondary | ICD-10-CM

## 2015-05-22 DIAGNOSIS — I5041 Acute combined systolic (congestive) and diastolic (congestive) heart failure: Secondary | ICD-10-CM

## 2015-05-22 DIAGNOSIS — I209 Angina pectoris, unspecified: Secondary | ICD-10-CM

## 2015-05-22 DIAGNOSIS — E875 Hyperkalemia: Secondary | ICD-10-CM

## 2015-05-22 DIAGNOSIS — I4891 Unspecified atrial fibrillation: Secondary | ICD-10-CM

## 2015-05-22 DIAGNOSIS — I129 Hypertensive chronic kidney disease with stage 1 through stage 4 chronic kidney disease, or unspecified chronic kidney disease: Secondary | ICD-10-CM

## 2015-05-22 DIAGNOSIS — J9620 Acute and chronic respiratory failure, unspecified whether with hypoxia or hypercapnia: Secondary | ICD-10-CM

## 2015-05-22 LAB — CULTURE, BLOOD (ROUTINE X 2)
CULTURE: NO GROWTH
Culture: NO GROWTH

## 2015-05-22 LAB — GLUCOSE, CAPILLARY
GLUCOSE-CAPILLARY: 118 mg/dL — AB (ref 65–99)
GLUCOSE-CAPILLARY: 111 mg/dL — AB (ref 65–99)
GLUCOSE-CAPILLARY: 105 mg/dL — AB (ref 65–99)
Glucose-Capillary: 114 mg/dL — ABNORMAL HIGH (ref 65–99)
Glucose-Capillary: 144 mg/dL — ABNORMAL HIGH (ref 65–99)

## 2015-05-22 LAB — BLOOD GAS, ARTERIAL
ACID-BASE EXCESS: 2.8 mmol/L (ref 0.0–3.0)
ACID-BASE EXCESS: 4.5 mmol/L — AB (ref 0.0–3.0)
ACID-BASE EXCESS: 5.6 mmol/L — AB (ref 0.0–3.0)
Allens test (pass/fail): POSITIVE — AB
Allens test (pass/fail): POSITIVE — AB
Allens test (pass/fail): POSITIVE — AB
BICARBONATE: 31 meq/L — AB (ref 21.0–28.0)
BICARBONATE: 32.2 meq/L — AB (ref 21.0–28.0)
Bicarbonate: 32.8 mEq/L — ABNORMAL HIGH (ref 21.0–28.0)
FIO2: 60
FIO2: 60
FIO2: 0.6
LHR: 20 {breaths}/min
LHR: 24 {breaths}/min
MECHANICAL RATE: 20
MECHANICAL RATE: 24
MECHVT: 500 mL
Mechanical Rate: 26
O2 SAT: 92.8 %
O2 Saturation: 88.7 %
O2 Saturation: 89.6 %
PATIENT TEMPERATURE: 37
PCO2 ART: 58 mmHg — AB (ref 32.0–48.0)
PEEP/CPAP: 10 cmH2O
PEEP/CPAP: 12 cmH2O
PEEP/CPAP: 12 cmH2O
PH ART: 7.36 (ref 7.350–7.450)
PO2 ART: 69 mmHg — AB (ref 83.0–108.0)
Patient temperature: 37
Patient temperature: 37
RATE: 26 resp/min
VT: 500 mL
VT: 500 mL
pCO2 arterial: 63 mmHg — ABNORMAL HIGH (ref 32.0–48.0)
pCO2 arterial: 61 mmHg — ABNORMAL HIGH (ref 32.0–48.0)
pH, Arterial: 7.3 — ABNORMAL LOW (ref 7.350–7.450)
pH, Arterial: 7.33 — ABNORMAL LOW (ref 7.350–7.450)
pO2, Arterial: 62 mmHg — ABNORMAL LOW (ref 83.0–108.0)
pO2, Arterial: 62 mmHg — ABNORMAL LOW (ref 83.0–108.0)

## 2015-05-22 LAB — CBC
HEMATOCRIT: 30.7 % — AB (ref 40.0–52.0)
HEMOGLOBIN: 9.8 g/dL — AB (ref 13.0–18.0)
MCH: 26.7 pg (ref 26.0–34.0)
MCHC: 32 g/dL (ref 32.0–36.0)
MCV: 83.4 fL (ref 80.0–100.0)
Platelets: 52 10*3/uL — ABNORMAL LOW (ref 150–440)
RBC: 3.68 MIL/uL — ABNORMAL LOW (ref 4.40–5.90)
RDW: 19.9 % — ABNORMAL HIGH (ref 11.5–14.5)
WBC: 9 10*3/uL (ref 3.8–10.6)

## 2015-05-22 LAB — BASIC METABOLIC PANEL
ANION GAP: 8 (ref 5–15)
BUN: 68 mg/dL — ABNORMAL HIGH (ref 6–20)
CHLORIDE: 101 mmol/L (ref 101–111)
CO2: 30 mmol/L (ref 22–32)
Calcium: 8.2 mg/dL — ABNORMAL LOW (ref 8.9–10.3)
Creatinine, Ser: 3.87 mg/dL — ABNORMAL HIGH (ref 0.61–1.24)
GFR calc Af Amer: 19 mL/min — ABNORMAL LOW (ref >60)
GFR, EST NON AFRICAN AMERICAN: 16 mL/min — AB (ref >60)
GLUCOSE: 125 mg/dL — AB (ref 65–99)
POTASSIUM: 4.4 mmol/L (ref 3.5–5.1)
Sodium: 139 mmol/L (ref 135–145)

## 2015-05-22 LAB — TROPONIN I
TROPONIN I: 0.14 ng/mL — AB (ref <0.031)
TROPONIN I: 0.11 ng/mL — AB (ref <0.031)
TROPONIN I: 0.1 ng/mL — AB (ref <0.031)

## 2015-05-22 LAB — PROTIME-INR
INR: 1.43
Prothrombin Time: 17.6 seconds — ABNORMAL HIGH (ref 11.4–15.0)

## 2015-05-22 LAB — PROTEIN / CREATININE RATIO, URINE
Creatinine, Urine: 163 mg/dL
PROTEIN CREATININE RATIO: 0.73 mg/mg{creat} — AB (ref 0.00–0.15)
Total Protein, Urine: 119 mg/dL

## 2015-05-22 LAB — APTT: aPTT: 29 seconds (ref 24–36)

## 2015-05-22 MED ORDER — LACTULOSE 10 GM/15ML PO SOLN
30.0000 g | Freq: Three times a day (TID) | ORAL | Status: DC
  Administered 2015-05-22 – 2015-05-26 (×12): 30 g via ORAL
  Filled 2015-05-22 (×12): qty 60

## 2015-05-22 MED ORDER — CARVEDILOL 12.5 MG PO TABS: 12.5000 mg | ORAL_TABLET | Freq: Two times a day (BID) | ORAL | Status: DC

## 2015-05-22 MED ORDER — CARVEDILOL 6.25 MG PO TABS
6.2500 mg | ORAL_TABLET | Freq: Two times a day (BID) | ORAL | Status: DC
  Administered 2015-05-22 – 2015-05-23 (×2): 6.25 mg via ORAL
  Filled 2015-05-22 (×2): qty 1

## 2015-05-22 MED ORDER — SENNOSIDES-DOCUSATE SODIUM 8.6-50 MG PO TABS
1.0000 | ORAL_TABLET | Freq: Two times a day (BID) | ORAL | Status: DC
  Administered 2015-05-22 – 2015-05-26 (×9): 1
  Filled 2015-05-22 (×9): qty 1

## 2015-05-22 MED ORDER — HYDRALAZINE HCL 20 MG/ML IJ SOLN: 10.0000 mg | Freq: Four times a day (QID) | INTRAMUSCULAR | Status: DC | PRN

## 2015-05-22 MED ORDER — DIGOXIN 0.25 MG/ML IJ SOLN
0.2500 mg | Freq: Every day | INTRAMUSCULAR | Status: DC
  Administered 2015-05-22 – 2015-05-24 (×3): 0.25 mg via INTRAVENOUS
  Filled 2015-05-22 (×4): qty 2

## 2015-05-22 MED ORDER — HYDROMORPHONE HCL 1 MG/ML IJ SOLN
1.0000 mg | Freq: Two times a day (BID) | INTRAMUSCULAR | Status: DC
  Administered 2015-05-22 – 2015-05-24 (×5): 1 mg via INTRAVENOUS
  Filled 2015-05-22 (×5): qty 1

## 2015-05-22 MED ORDER — METOPROLOL TARTRATE 1 MG/ML IV SOLN: 5.0000 mg | Freq: Four times a day (QID) | INTRAVENOUS | Status: DC | PRN

## 2015-05-22 MED ORDER — ALBUMIN HUMAN 25 % IV SOLN
25.0000 g | Freq: Once | INTRAVENOUS | Status: AC
  Administered 2015-05-22: 25 g via INTRAVENOUS
  Filled 2015-05-22: qty 100

## 2015-05-22 NOTE — Progress Notes (Signed)
Paged Cardiologist and Intensivist about troponin results (0.14). Intensivist, Dr. Dema Severin, called back and acknowledged no new orders from him at this time. Awaiting callback from Cardiologist.

## 2015-05-22 NOTE — Progress Notes (Signed)
   SUBJECTIVE: Pt intubated and sedated. Tele shows a-fib 120s VR.   Filed Vitals:   05/22/15 0938 05/22/15 0955 05/22/15 1000 05/22/15 1015  BP: 125/79 128/71 124/69 105/67  Pulse: 110 109 95 110  Temp: 99 F (37.2 C)     TempSrc: Oral     Resp: Height:      Weight: 169.6 kg (373 lb 14.4 oz)     SpO2: 89% 90% 89% 88%    Intake/Output Summary (Last 24 hours) at 05/22/15 1036 Last data filed at 05/22/15 1000  Gross per 24 hour  Intake 1666.71 ml  Output   5642 ml  Net -3975.29 ml    LABS: Basic Metabolic Panel:  Recent Labs  69/62/95 0517 05/22/15 0450  NA 137 139  K 4.8 4.4  CL 103 101  CO2 26 30  GLUCOSE 136* 125*  BUN 78* 68*  CREATININE 4.39* 3.87*  CALCIUM 8.4* 8.2*  MG 2.5*  --   PHOS 7.4*  --    Liver Function Tests: No results for input(s): AST, ALT, ALKPHOS, BILITOT, PROT, ALBUMIN in the last 72 hours. No results for input(s): LIPASE, AMYLASE in the last 72 hours. CBC:  Recent Labs  05/23/2015 1457 05/22/15 0450  WBC 9.6 9.0  HGB 9.8* 9.8*  HCT 30.6* 30.7*  MCV 83.0 83.4  PLT 58* 52*   Cardiac Enzymes: No results for input(s): CKTOTAL, CKMB, CKMBINDEX, TROPONINI in the last 72 hours. BNP: Invalid input(s): POCBNP D-Dimer: No results for input(s): DDIMER in the last 72 hours. Hemoglobin A1C:  Recent Labs  05/20/15 1224  HGBA1C 5.7   Fasting Lipid Panel: No results for input(s): CHOL, HDL, LDLCALC, TRIG, CHOLHDL, LDLDIRECT in the last 72 hours. Thyroid Function Tests: No results for input(s): TSH, T4TOTAL, T3FREE, THYROIDAB in the last 72 hours.  Invalid input(s): FREET3 Anemia Panel: No results for input(s): VITAMINB12, FOLATE, FERRITIN, TIBC, IRON, RETICCTPCT in the last 72 hours.   PHYSICAL EXAM General: Obese, disheveled  HEENT: normal Neck: supple. no JVD. Carotids 2+ bilat; no bruits. No lymphadenopathy or thryomegaly appreciated. Cor: PMI nondisplaced. IRIR. No rubs, gallops or murmurs. Lungs: decreased  breath sounds b/l. Mild wheezes  Abdomen: soft, nontender, nondistended. No hepatosplenomegaly. No bruits or masses. Good bowel sounds. Extremities: no cyanosis, clubbing, rash. 2+ pedal edema b/l Neuro: alert & oriented x 0, cranial nerves grossly intact. moves all 4 extremities w/o difficulty.  TELEMETRY: Reviewed telemetry pt in a-fib 120s VR  ASSESSMENT AND PLAN: acute on chronic diastolic dysfunction 2/2 fluid overload. Pt not intubated and sedated 2/2 respiratory failure. Planned for cardiac cath today, but pt weighs too much for [facility] cath table. Once pts respiratory status is stable, consider transfer to [facility] as their table has higher weight limit.   New onset a-fib: start digoxin 0.25mg  IV once, agree to amio gtt. Increase carvedilol to 6.25mg  BID   Patient and plan discussed with supervising provider, Dr. Adrian Blackwater, who agrees with above findings.   Alinda Sierras Margarito Courser [facility]  05/22/2015 10:36 AM

## 2015-05-22 NOTE — Progress Notes (Signed)
ANTIBIOTIC CONSULT NOTE - INITIAL  Pharmacy Consult for Vancomycin/Zosyn Indication: rule out sepsis  Allergies  Allergen Reactions  . Morphine And Related Other (See Comments)    Reaction:  Headaches     Patient Measurements: Height:  (170.2 cm) Weight: (!) 373 lb 14.4 oz (169.6 kg) IBW/kg (Calculated) : 66.1 Adjusted Body Weight: 109.3 kg  Vital Signs: Temp: 98.6 F (37 C) (09/28 0750) Temp Source: Oral (09/28 0750) BP: 156/95 mmHg (09/28 0900) Pulse Rate: 128 (09/28 0900) Intake/Output from previous day: 09/27 0701 - 09/28 0700 In: 1483.9 [I.V.:703.9; NG/GT:230; IV Piggyback:550] Out: 5442 [Urine:975; Emesis/NG output:900] Intake/Output from this shift:    Labs:  Recent Labs  05/20/15 1310 06/17/15 0517 06-17-2015 1457 05/22/15 0450  WBC  --  8.6 9.6 9.0  HGB  --  9.8* 9.8* 9.8*  PLT  --  85* 58* 52*  CREATININE 3.80* 4.39*  --  3.87*   Estimated Creatinine Clearance: 32.8 mL/min (by C-G formula based on Cr of 3.87). No results for input(s): VANCOTROUGH, VANCOPEAK, VANCORANDOM, GENTTROUGH, GENTPEAK, GENTRANDOM, TOBRATROUGH, TOBRAPEAK, TOBRARND, AMIKACINPEAK, AMIKACINTROU, AMIKACIN in the last 72 hours.   Microbiology: Recent Results (from the past 720 hour(s))  MRSA PCR Screening     Status: None   Collection Time: 05/01/15  9:42 PM  Result Value Ref Range Status   MRSA by PCR NEGATIVE NEGATIVE Final    Comment:        The GeneXpert MRSA Assay (FDA approved for NASAL specimens only), is one component of a comprehensive MRSA colonization surveillance program. It is not intended to diagnose MRSA infection nor to guide or monitor treatment for MRSA infections.   Culture, blood (routine x 2)     Status: None   Collection Time: 05/20/2015  4:10 PM  Result Value Ref Range Status   Specimen Description BLOOD RIGHT AC  Final   Special Requests BOTTLES DRAWN AEROBIC AND ANAEROBIC  9CC  Final   Culture NO GROWTH 5 DAYS  Final   Report Status 05/22/2015  FINAL  Final  Culture, blood (routine x 2)     Status: None   Collection Time: 05/22/2015  4:18 PM  Result Value Ref Range Status   Specimen Description BLOOD LEFT HAND  Final   Special Requests BOTTLES DRAWN AEROBIC AND ANAEROBIC  4CC  Final   Culture NO GROWTH 5 DAYS  Final   Report Status 05/22/2015 FINAL  Final  MRSA PCR Screening     Status: None   Collection Time: 05/20/15  1:53 PM  Result Value Ref Range Status   MRSA by PCR NEGATIVE NEGATIVE Final    Comment:        The GeneXpert MRSA Assay (FDA approved for NASAL specimens only), is one component of a comprehensive MRSA colonization surveillance program. It is not intended to diagnose MRSA infection nor to guide or monitor treatment for MRSA infections.   Culture, blood (routine x 2)     Status: None (Preliminary result)   Collection Time: 2015/06/17  2:52 PM  Result Value Ref Range Status   Specimen Description BLOOD LEFT ASSIST CONTROL  Final   Special Requests BAA 8CC AER 8CC  Final   Culture NO GROWTH < 24 HOURS  Final   Report Status PENDING  Incomplete  Culture, blood (routine x 2)     Status: None (Preliminary result)   Collection Time: 2015-06-17  3:44 PM  Result Value Ref Range Status   Specimen Description BLOOD LEFT ASSIST CONTROL  Final  Special Requests NONE  Final   Culture NO GROWTH < 24 HOURS  Final   Report Status PENDING  Incomplete    Medical History: Past Medical History  Diagnosis Date  . Hypertension   . COPD (chronic obstructive pulmonary disease)   . Chronic kidney disease   . Chronic back pain greater than 3 months duration   . Phlebitis 1992    left calf  . Anxiety and depression   . CHF (congestive heart failure)   . Hepatitis B     Medications:  Scheduled:  . amLODipine  10 mg Oral Daily  . antiseptic oral rinse  7 mL Mouth Rinse QID  . aspirin EC  81 mg Oral Daily  . atorvastatin  20 mg Oral QHS  . carvedilol  12.5 mg Oral BID WC  . chlorhexidine gluconate  15 mL Mouth Rinse  BID  . clopidogrel  75 mg Oral Daily  . feeding supplement (PRO-STAT SUGAR FREE 64)  30 mL Oral BID  . feeding supplement (VITAL HIGH PROTEIN)  1,000 mL Per Tube Q24H  . fluticasone  2 spray Each Nare Daily  . free water  100 mL Per Tube 3 times per day  . furosemide  80 mg Intravenous BID  . heparin  5,000 Units Subcutaneous 3 times per day  . hydrALAZINE  25 mg Oral 3 times per day  . insulin aspart  2-6 Units Subcutaneous 6 times per day  . ipratropium-albuterol  3 mL Nebulization Q4H  . isosorbide mononitrate  30 mg Oral Daily  . lactulose  30 g Oral BID  . methylPREDNISolone (SOLU-MEDROL) injection  60 mg Intravenous Daily  . pantoprazole (PROTONIX) IV  40 mg Intravenous Q24H  . piperacillin-tazobactam (ZOSYN)  IV  3.375 g Intravenous Q12H  . sodium chloride  3 mL Intravenous Q12H   Infusions:  . amiodarone 30 mg/hr (05/22/15 0556)  . fentaNYL infusion INTRAVENOUS 200 mcg/hr (05/22/15 0856)   PRN: sodium chloride, sodium chloride, albuterol, alteplase, fentaNYL, heparin, hydrALAZINE, HYDROcodone-acetaminophen, lidocaine (PF), lidocaine-prilocaine, midazolam, midazolam, nitroGLYCERIN, pentafluoroprop-tetrafluoroeth, senna-docusate, sodium chloride, vancomycin  Assessment: 54 y/o M with CKD requiring temporary HD this admission ordered empiric abx for presumed sepsis.   Goal of Therapy:  Pre-HD vancomycin level: 15-25, post-HD vancomycin level: 5-15  Plan:  Continue Zosyn 3.375 g EI q 12 hours.  Renal MD plans for HD today so will need to schedule a vancomycin dose at the end of or after HD session. Will continue to follow plans for HD and culture results.  Luisa Hart D 05/22/2015,9:32 AM

## 2015-05-22 NOTE — Progress Notes (Signed)
[facility] - [place] at [facility]   PATIENT NAME: Gary Juarez    MR#:  [id number]  DATE OF BIRTH:  14-Jan-1961  SUBJECTIVE:  CHIEF COMPLAINT:   Chief Complaint  Patient presents with  . Shortness of Breath  Opened eyes upon stimuli, on vent and amiodarone drip.  REVIEW OF SYSTEMS:  Unable to get a ROS due to confusion.  DRUG ALLERGIES:   Allergies  Allergen Reactions  . Morphine And Related Other (See Comments)    Reaction:  Headaches     VITALS:  Blood pressure 114/64, pulse 116, temperature 99 F (37.2 C), temperature source Oral, resp. rate 21, height  (1.702 m), weight 169.6 kg (373 lb 14.4 oz), SpO2 92 %.  PHYSICAL EXAMINATION:  GENERAL:  54 y.o.-year-old patient lying in the bed with no acute distress. Morbidly obese. Critical ill-looking. EYES: Pupils equal, round, reactive to light and accommodation. No scleral icterus. Extraocular muscles intact.  HEENT: Head atraumatic, normocephalic. Oropharynx and nasopharynx clear. Moist oral mucosa. NECK:  Supple, no jugular venous distention. No thyroid enlargement, no tenderness.  LUNGS: Weak breath sounds bilaterally, no wheezing, bilateral crackles and rhonchi. Use of accessory muscles of respiration.  CARDIOVASCULAR: S1, S2 normal. No murmurs, rubs, or gallops.  ABDOMEN: Soft, nontender, nondistended. Bowel sounds present. Abdominal wall edema 1+. EXTREMITIES: Bilateral lower extremity edema 2+, no cyanosis, or clubbing.  NEUROLOGIC: on vent and sedation,  unable to exam. PSYCHIATRIC: on vent and sedation. SKIN: No obvious rash, lesion, or ulcer.    LABORATORY PANEL:   CBC  Recent Labs Lab 05/22/15 0450  WBC 9.0  HGB 9.8*  HCT 30.7*  PLT 52*   ------------------------------------------------------------------------------------------------------------------  Chemistries   Recent Labs Lab 04/30/2015 1402  05/12/2015 0517 05/22/15 0450  NA 135  < > 137 139  K 5.0  < > 4.8  4.4  CL 105  < > 103 101  CO2 22  < > 26 30  GLUCOSE 181*  < > 136* 125*  BUN 76*  < > 78* 68*  CREATININE 4.21*  < > 4.39* 3.87*  CALCIUM 8.0*  < > 8.4* 8.2*  MG  --   --  2.5*  --   AST 14*  --   --   --   ALT 15*  --   --   --   ALKPHOS 62  --   --   --   BILITOT 0.8  --   --   --   < > = values in this interval not displayed. ------------------------------------------------------------------------------------------------------------------  Cardiac Enzymes  Recent Labs Lab 05/24/2015 1402  TROPONINI 0.03   ------------------------------------------------------------------------------------------------------------------  RADIOLOGY:  Dg Abd 1 View  05/20/2015   CLINICAL DATA:  Nasogastric tube placement  EXAM: ABDOMEN - 1 VIEW  COMPARISON:  Study obtained earlier in the day  FINDINGS: Nasogastric to tip and side port are below the diaphragm in the region of the stomach. The bowel gas pattern appears unremarkable in the visualized regions. There is extensive lower lobe infiltrate bilaterally.  IMPRESSION: Nasogastric tube tip and side port in stomach. Visualized bowel gas pattern grossly normal.   Electronically Signed   By: Bretta Bang III M.D.   On: 04/29/2015 18:04   Dg Abd 1 View  05/06/2015   CLINICAL DATA:  OG tube placement.  EXAM: ABDOMEN - 1 VIEW  COMPARISON:  None.  FINDINGS: No orogastric tube is identified. There is no bowel dilatation to suggest obstruction. There is no evidence of  pneumoperitoneum, portal venous gas or pneumatosis. There are no pathologic calcifications along the expected course of the ureters.The osseous structures are unremarkable.  IMPRESSION: No orogastric tube is visualized below the diaphragm.   Electronically Signed   By: Elige Ko   On: 06-20-15 16:24   Dg Chest Port 1 View  20-Jun-2015   CLINICAL DATA:  Acute respiratory distress. Intubated. Line placement.  EXAM: PORTABLE CHEST 1 VIEW  COMPARISON:  Chest radiograph from earlier today.   FINDINGS: Endotracheal tube tip is 3.3 cm above the carina. Right internal jugular central venous catheter terminates in the right atrium near the cavoatrial junction. Enteric tube enters the lower thoracic esophagus, with the tip not definitely visualized on this radiograph. Stable cardiomediastinal silhouette with moderate cardiomegaly. No pneumothorax. Stable small bilateral pleural effusions. Severe patchy airspace opacities are noted in the parahilar and basilar lungs bilaterally, not appreciably changed.  IMPRESSION: 1. Well-positioned endotracheal tube and right internal jugular central venous catheter. No pneumothorax. 2. Enteric tube enters the lower thoracic esophagus, with the tip not definitely seen on this image. 3. Stable moderate cardiomegaly and severe patchy parahilar and basilar airspace opacities bilaterally, favor severe pulmonary edema due to congestive heart failure. 4. Stable small bilateral pleural effusions.   Electronically Signed   By: Delbert Phenix M.D.   On: 2015/06/20 16:31   Dg Chest Port 1 View  Jun 20, 2015   CLINICAL DATA:  Status post intubation.  EXAM: PORTABLE CHEST 1 VIEW  COMPARISON:  May 20, 2015.  FINDINGS: Stable cardiomegaly with central pulmonary vascular congestion. Increased bilateral perihilar and basilar densities are noted concerning for worsening pulmonary edema. Endotracheal tube is in grossly good position with distal tip 5.6 cm above the carina. No pneumothorax is noted. Probable bilateral pleural effusions are noted. Bony thorax is unremarkable.  IMPRESSION: Endotracheal tube in grossly good position. Stable cardiomegaly is noted, with increased bilateral perihilar and basilar densities concerning for worsening pulmonary edema and possible associated pleural effusions. These findings are concerning for congestive heart failure.   Electronically Signed   By: Lupita Raider, M.D.   On: 06-20-2015 08:50   Dg Chest Port 1 View  05/20/2015   CLINICAL DATA:   Hypoxia and altered mental status today.  EXAM: PORTABLE CHEST 1 VIEW  COMPARISON:  May 17, 2015  FINDINGS: The heart size and mediastinal contours are stable. The heart size is enlarged. There is pulmonary edema. There is no focal pneumonia. The visualized skeletal structures are stable.  IMPRESSION: Congestive heart failure.   Electronically Signed   By: Sherian Rein M.D.   On: 05/20/2015 21:48    EKG:   Orders placed or performed during the hospital encounter of 05/06/2015  . ED EKG  . ED EKG  . EKG 12-Lead  . EKG 12-Lead  . EKG 12-Lead  . EKG 12-Lead    ASSESSMENT AND PLAN:   1. Acute on chronic respiratory failure with hypoxia and hypercapenia.  Intubated yesterday, continue vent  2. COPD exacerbation. Continue IV Solu-Medrol, vancomycin and zosyn,  budesonide nebulizer, Spiriva and DuoNeb nebulizers.  3. Acute on chronic diastolic congestive heart failure. Continue hemodialysis today, On Lasix 80 mg IV twice a day.  Coreg was on hold secondary to bradycardia. Bradycardia resolved.  hydralazine and norvasc were on hold secondary to relative hypotension. Blood pressure is high, resumed hydralazine and norvasc. Resumed coreg.  * New onset Afib. Continue amiodarone drip and start digoxin, Coreg is increased to 6.25 mg bid.   4. 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 Thedore Mins.  5. Morbid obesity and likely sleep apnea 6. History of hepatitis B and liver disease- on lactulose 7. Chronic back pain on chronic pain medications.  Hyperkalemia. Hold spironolactone, improved after hemodialysis yesterday. Follow up BMP.  Chronic thrombocytopenia. Stable.   Altered mental status due to acute metabolic encephalopathy,  due to respiratory failure/renal failure/med. On sedation. Hold gabapentin. Fall and aspiration precaution.  I discussed with Dr. Thedore Mins and Dr. Dema Severin. All the records  are reviewed and case discussed with Care Management/Social Workerr. I discussed with the patient's father about the patient's critical condition and Management plans, and he is in agreement.  CODE STATUS: Full code  TOTAL CRITICAL TIME TAKING CARE OF THIS PATIENT: 45 minutes.   POSSIBLE D/C IN >3 DAYS, DEPENDING ON CLINICAL CONDITION.   Shaune Pollack M.D on 05/22/2015 at 12:09 PM  Between 7am to 6pm - Pager - [phone]  After 6pm go to [email or web address] - password [email or web address] [facility]  [place] [facility]  Office  [phone]  CC: Primary care physician; Verlee Monte, PA-C

## 2015-05-22 NOTE — Progress Notes (Signed)
Nutrition Follow-up  INTERVENTION:   EN: recommend continuing to titrate TF as tolerated to goal rate as per order set, continue Prostat supplementation Coordination of Care: discussed current bowel regimen during ICU rounds, lack of BM, ammonia level during; MD adjusting bowel regimen  NUTRITION DIAGNOSIS:   Inadequate oral intake related to acute illness as evidenced by NPO status. Being addressed via TF  GOAL:   Provide needs based on ASPEN/SCCM guidelines  MONITOR:    (Energy Intake, Anthropometrics, Digestive System, Pulmonary, Electrolyte/Renal profile, Glucose Profile)  REASON FOR ASSESSMENT:   Consult, Ventilator Enteral/tube feeding initiation and management  ASSESSMENT:    Pt remains sedated on vent, on fentanyl, receiving HD this AM  Diet Order:  Diet NPO time specified   EN: TF infusing at 20 ml/hr this AM; held during the night for residuals per report but residuals less than 500 mL  Digestive System: no signs of TF intolerance, residuals have been <500 mL (TF held despite being less than 500 mL threshold), residuals this AM 10 mL; no BM since 9/25 despite being on lactulose  Skin:  Reviewed, no issues   Electrolyte and Renal Profile:  Recent Labs Lab 05/19/15 1024  05/20/15 1310 04/30/2015 0517 05/22/15 0450  BUN  --   < > 70* 78* 68*  CREATININE  --   < > 3.80* 4.39* 3.87*  NA  --   < > 137 137 139  K  --   < > 4.7 4.8 4.4  MG  --   --   --  2.5*  --   PHOS 8.3*  --   --  7.4*  --   < > = values in this interval not displayed.   Urine Volume: UOP 975 mL in past 24 hours  Last BM:  9/25  Height:   Ht Readings from Last 1 Encounters:  06/09/15  (1.702 m)    Weight:   Wt Readings from Last 1 Encounters:  05/22/15 373 lb 14.4 oz (169.6 kg)    Filed Weights   05/09/2015 1335 05/22/15 0606 05/22/15 0938  Weight: 384 lb 0.7 oz (174.2 kg) 373 lb 14.4 oz (169.6 kg) 373 lb 14.4 oz (169.6 kg)    BMI:  Body mass index is 58.55  kg/(m^2).  Estimated Nutritional Needs:   Kcal:  4540-9811 kcals (22-25 kcals/kg IBW per ASPEN guidelines for BMI >50)  Protein:  134-168 g (2.0-2.5 g/kg)   Fluid:  1000 mL plus UOP  HIGH Care Level  Romelle Starcher MS, RD, LDN [phone] Pager

## 2015-05-22 NOTE — Consult Note (Signed)
Palliative Medicine Inpatient Consult Note   Name: Gary Juarez Date: 05/22/2015 MRN: [id number]  DOB: [birth year]  Referring Physician: Shaune Pollack, MD  Palliative Care consult requested for this 54 y.o. male for goals of medical therapy in patient with acute on chronic respiratory failure.  He was discharged from this hospital for acute on chronic respiratory failure  He had home health visiting him in the home and the nurse advised him to go to the hospital on 05/01/2015 due to a gain of 10 lbs in 3 days and obvious dyspnea and chest tightness.  During his most recent admission, he showed initial improvement in ER after Narcan.  IMPRESSION: Metabolic Encephalopathy thought due to CO2 narcosis (second admission for same in same month) Acute on chronic respiratory failure ---multifactorial  Due to CHF, renal failure, obesity hypoventilation, OSA, and COPD COPD exacerbation  Acute on Chronic diastolic CHF ---recent echo showed nl LVF but LVH and diastolic dysfxn --to have outpt work up Acute on Chronic kidney failure (CKD stage IV since earlier this month --previously stage III)) Morbid Obesity Probable Sleep Apnea H/O Hepatitis B with liver disease  ---on lactulose Chronic Back Pain on chronic pain medications Essential HTN Anxiety and Depression H/O CAD Hyperkalemia Dyslipidemia H/O left leg phlebitis   PLAN: Will return earlier in day tomorrow to have discussion with pt. Have discussed pt with attending and nursing. Will need to address code status as first topic.  Disposition when he stabilizes will be important topic to discuss after I speak with soc work/ care mgmt.   REVIEW OF SYSTEMS:  Still short of breath.   SPIRITUAL SUPPORT SYSTEM: Unclear.  SOCIAL HISTORY:  reports that he quit smoking about 5 months ago. His smoking use included Cigarettes. He has a 5 pack-year smoking history. He has never used smokeless tobacco. He reports that he does not drink alcohol or use  illicit drugs.   CODE STATUS: Full code  PAST MEDICAL HISTORY: Past Medical History  Diagnosis Date  . Hypertension   . COPD (chronic obstructive pulmonary disease)   . Chronic kidney disease   . Chronic back pain greater than 3 months duration   . Phlebitis 1992    left calf  . Anxiety and depression   . CHF (congestive heart failure)   . Hepatitis B     PAST SURGICAL HISTORY:  Past Surgical History  Procedure Laterality Date  . Wisdom tooth extraction      ALLERGIES:  is allergic to morphine and related.  MEDICATIONS:  Current Facility-Administered Medications  Medication Dose Route Frequency Provider Last Rate Last Dose  . 0.9 %  sodium chloride infusion  100 mL Intravenous PRN Munsoor Lateef, MD      . 0.9 %  sodium chloride infusion  100 mL Intravenous PRN Munsoor Lateef, MD      . albuterol (PROVENTIL) (2.5 MG/3ML) 0.083% nebulizer solution 2.5 mg  2.5 mg Nebulization Q6H PRN Alford Highland, MD      . alteplase (CATHFLO ACTIVASE) injection 2 mg  2 mg Intracatheter Once PRN Munsoor Lateef, MD      . amiodarone (NEXTERONE PREMIX) 360 MG/200ML (1.8 mg/mL) IV infusion  30 mg/hr Intravenous Continuous Shaune Pollack, MD 16.7 mL/hr at 05/22/15 0556 30 mg/hr at 05/22/15 0556  . amLODipine (NORVASC) tablet 10 mg  10 mg Oral Daily Shaune Pollack, MD   10 mg at 05/20/15 1400  . antiseptic oral rinse solution (CORINZ)  7 mL Mouth Rinse QID Stephanie Acre, MD  7 mL at 05/22/15 0400  . aspirin EC tablet 81 mg  81 mg Oral Daily Alford Highland, MD   81 mg at 05/18/15 1030  . atorvastatin (LIPITOR) tablet 20 mg  20 mg Oral QHS Alford Highland, MD   20 mg at 04/30/2015 2140  . carvedilol (COREG) tablet 12.5 mg  12.5 mg Oral BID WC Rise Mu, PA-C      . chlorhexidine gluconate (PERIDEX) 0.12 % solution 15 mL  15 mL Mouth Rinse BID Vishal Mungal, MD   15 mL at 05/22/15 0832  . clopidogrel (PLAVIX) tablet 75 mg  75 mg Oral Daily Alford Highland, MD   75 mg at 05/13/2015 1110  . feeding  supplement (PRO-STAT SUGAR FREE 64) liquid 30 mL  30 mL Oral BID Vishal Mungal, MD   30 mL at 05/13/2015 2142  . feeding supplement (VITAL HIGH PROTEIN) liquid 1,000 mL  1,000 mL Per Tube Q24H Vishal Mungal, MD      . fentaNYL (SUBLIMAZE) bolus via infusion 50 mcg  50 mcg Intravenous Q1H PRN Vishal Mungal, MD   50 mcg at 05/22/15 0857  . fentaNYL in NS (56mcg/ml) infusion-PREMIX  25-400 mcg/hr Intravenous Continuous Vishal Mungal, MD 20 mL/hr at 05/22/15 0856 200 mcg/hr at 05/22/15 0856  . fluticasone (FLONASE) 50 MCG/ACT nasal spray 2 spray  2 spray Each Nare Daily Shaune Pollack, MD   2 spray at 05/19/15 1117  . free water 100 mL  100 mL Per Tube 3 times per day Vishal Mungal, MD   100 mL at 05/22/15 0600  . furosemide (LASIX) injection 80 mg  80 mg Intravenous BID Alford Highland, MD   80 mg at 05/22/15 4098  . heparin injection 1,000 Units  1,000 Units Intracatheter PRN Munsoor Lateef, MD   2,600 Units at 05/20/15 1234  . heparin injection 5,000 Units  5,000 Units Subcutaneous 3 times per day Alford Highland, MD   5,000 Units at 05/22/15 0556  . hydrALAZINE (APRESOLINE) injection 10 mg  10 mg Intravenous Q6H PRN Shaune Pollack, MD   10 mg at 04/27/2015 0405  . hydrALAZINE (APRESOLINE) tablet 25 mg  25 mg Oral 3 times per day Shaune Pollack, MD   25 mg at 05/22/15 0556  . HYDROcodone-acetaminophen (NORCO/VICODIN) 5-325 MG per tablet 1 tablet  1 tablet Oral Q6H PRN Alford Highland, MD   1 tablet at 05/19/15 1953  . insulin aspart (novoLOG) injection 2-6 Units  2-6 Units Subcutaneous 6 times per day Storm Frisk, MD   2 Units at 05/22/15 0003  . ipratropium-albuterol (DUONEB) 0.5-2.5 (3) MG/3ML nebulizer solution 3 mL  3 mL Nebulization Q4H Storm Frisk, MD   3 mL at 05/22/15 0740  . isosorbide mononitrate (IMDUR) 24 hr tablet 30 mg  30 mg Oral Daily Alford Highland, MD   30 mg at 05/19/15 1035  . lactulose (CHRONULAC) 10 GM/15ML solution 30 g  30 g Oral BID Alford Highland, MD   30 g at 05/18/2015  2141  . lidocaine (PF) (XYLOCAINE) 1 % injection 5 mL  5 mL Intradermal PRN Munsoor Lateef, MD      . lidocaine-prilocaine (EMLA) cream 1 application  1 application Topical PRN Munsoor Lateef, MD      . methylPREDNISolone sodium succinate (SOLU-MEDROL) 125 mg/2 mL injection 60 mg  60 mg Intravenous Daily Shaune Pollack, MD   60 mg at 05/11/2015 1005  . midazolam (VERSED) injection 2 mg  2 mg Intravenous Q15 min PRN Vishal Mungal,  MD      . midazolam (VERSED) injection 2 mg  2 mg Intravenous Q2H PRN Vishal Mungal, MD   2 mg at 05/22/15 0419  . nitroGLYCERIN (NITROSTAT) SL tablet 0.4 mg  0.4 mg Sublingual Q5 min PRN Alford Highland, MD      . pantoprazole (PROTONIX) injection 40 mg  40 mg Intravenous Q24H Storm Frisk, MD   40 mg at 05/02/2015 2141  . pentafluoroprop-tetrafluoroeth (GEBAUERS) aerosol 1 application  1 application Topical PRN Munsoor Lateef, MD      . piperacillin-tazobactam (ZOSYN) IVPB 3.375 g  3.375 g Intravenous Q12H Vishal Mungal, MD   3.375 g at 05/22/2015 2142  . senna-docusate (Senokot-S) tablet 1 tablet  1 tablet Per Tube BID PRN Cindi Carbon, RPH      . sodium chloride 0.9 % injection 3 mL  3 mL Intravenous PRN Shaune Pollack, MD      . sodium chloride 0.9 % injection 3 mL  3 mL Intravenous Q12H Shaune Pollack, MD   3 mL at 05/20/2015 2142  . vancomycin (VANCOCIN) IVPB 1000 mg/200 mL premix  1,000 mg Intravenous Q dialysis Vishal Mungal, MD        Vital Signs: BP 156/95 mmHg  Pulse 128  Temp(Src) 98.6 F (37 C) (Oral)  Resp 20  Ht 5\' 7"  (1.702 m)  Wt 169.6 kg (373 lb 14.4 oz)  BMI 58.55 kg/m2  SpO2 91% Filed Weights   05/08/2015 0930 04/25/2015 1335 05/22/15 0606  Weight: 177.2 kg (390 lb 10.5 oz) 174.2 kg (384 lb 0.7 oz) 169.6 kg (373 lb 14.4 oz)    Estimated body mass index is 58.55 kg/(m^2) as calculated from the following:   Height as of this encounter: 5\' 7"  (1.702 m).   Weight as of this encounter: 169.6 kg (373 lb 14.4 oz).  PERFORMANCE STATUS (ECOG) : 4 - Bedbound    PHYSICAL EXAM: Intubated sleeping Eyes closed Has been waking up off sedation Hrt irreg irreg Lungs decreased bs in bases Abd soft and nontender nl BS Skin warm and dry   LABS: CBC:    Component Value Date/Time   WBC 9.0 05/22/2015 0450   WBC 4.9 12/12/2014 2234   HGB 9.8* 05/22/2015 0450   HGB 11.2* 12/12/2014 2234   HCT 30.7* 05/22/2015 0450   HCT 34.9* 12/12/2014 2234   PLT 52* 05/22/2015 0450   PLT 94* 12/12/2014 2234   MCV 83.4 05/22/2015 0450   MCV 89 12/12/2014 2234   NEUTROABS 9.1* 05/01/2015 1651   NEUTROABS 3.0 12/12/2014 2234   LYMPHSABS 1.1 05/01/2015 1651   LYMPHSABS 0.7* 12/12/2014 2234   MONOABS 2.0* 05/01/2015 1651   MONOABS 1.0 12/12/2014 2234   EOSABS 0.2 05/01/2015 1651   EOSABS 0.2 12/12/2014 2234   BASOSABS 0.0 05/01/2015 1651   BASOSABS 0.0 12/12/2014 2234   Comprehensive Metabolic Panel:    Component Value Date/Time   NA 139 05/22/2015 0450   NA 140 12/12/2014 2234   K 4.4 05/22/2015 0450   K 4.5 12/12/2014 2234   CL 101 05/22/2015 0450   CL 100* 12/12/2014 2234   CO2 30 05/22/2015 0450   CO2 34* 12/12/2014 2234   BUN 68* 05/22/2015 0450   BUN 36* 12/12/2014 2234   CREATININE 3.87* 05/22/2015 0450   CREATININE 2.57* 12/12/2014 2234   GLUCOSE 125* 05/22/2015 0450   GLUCOSE 107* 12/12/2014 2234   CALCIUM 8.2* 05/22/2015 0450   CALCIUM 8.6* 12/12/2014 2234   AST 14* 05/09/2015 1402  AST 31 12/12/2014 2234   ALT 15* 04/29/2015 1402   ALT 18 12/12/2014 2234   ALKPHOS 62 04/29/2015 1402   ALKPHOS 71 12/12/2014 2234   BILITOT 0.8 05/23/2015 1402   BILITOT 1.3* 12/12/2014 2234   PROT 6.2* 05/06/2015 1402   PROT 6.5 12/12/2014 2234   ALBUMIN 3.0* 05/16/2015 1402   ALBUMIN 3.2* 12/12/2014 2234   More than 50% of the visit was spent in counseling/coordination of care: Yes  Time Spent: 50 minutes

## 2015-05-22 NOTE — Progress Notes (Signed)
eLink Physician-Brief Progress Note Patient Name: Gary Juarez DOB: [birth year] MRN: [id number]   Date of Service  05/22/2015  HPI/Events of Note  ABG on 60%/PRVC 24/TV 500/P 12 = 7.33/61/62.  eICU Interventions  Will increase rate to 26 and recheck ABG at 5:15 PM.     Intervention Category Major Interventions: Respiratory failure - evaluation and management  Sommer,Steven Eugene 05/22/2015, 4:04 PM

## 2015-05-22 NOTE — Progress Notes (Signed)
Notified eLink MD of ABG results.  No new orders given, will leave ventilator at current settings and continue to monitor.

## 2015-05-22 NOTE — Progress Notes (Signed)
eLink Physician-Brief Progress Note Patient Name: Gary Juarez DOB: March 23, 1961 MRN: [id number]   Date of Service  05/22/2015  HPI/Events of Note  ABG on 60%/PRVC 26/TV 500/P 12 = 7.36/58/69/32.8  eICU Interventions  Continue current management.      Intervention Category Major Interventions: Respiratory failure - evaluation and management  Cisco Kindt Dennard Nip 05/22/2015, 5:39 PM

## 2015-05-22 NOTE — Consult Note (Signed)
PULMONARY / CRITICAL CARE MEDICINE   Name: Gary Juarez MRN: [id number] DOB: [birth year]    ADMISSION DATE:  05/05/2015 CONSULTATION DATE: 05/20/15  REFERRING MD :  Dr. Verdell Carmine   CHIEF COMPLAINT:     Short of breath.    HISTORY OF PRESENT ILLNESS  Patient unable to provide history due to lethargy and respiratory distress.  History per chart review and speaking with father  05/05/2015 54 y.o. male with a known history of diastolic congestive heart failure, COPD and stage on oxygen, hepatitis B, chronic kidney disease stage IV, hypertension, presented with shortness of breath and weight gain on 05/16/2015. Per chart review he gained 10 pounds in 3 days. His nurse stopped visited him on 04/29/2015 and referred him into the hospital for heavy breathing. At the time of admission he felt tightness in his abdomen and also chest tightness. Not much cough. He does feel nauseous. Admitting physician stated per chart review that "talking with him he was shaking and very cold." Diuresis was attempted but unsuccessful, an HD cath was placed on 9/24.  On 9/25 temp HD was attempted but patient was noted to be agitated, he was given 57m IV ativan. Throughout the night, he was noted to be more lethargic and unresponsive, overnight MD, gave flumazenil, with very little improvement.  This morning, he was noted to be on bipap, with no alertness, heaving breathing, and in moderate respiratory distress.  His GCS <8    SUBJECTIVE: Patient more agitated and awake this morning, could not perform WUA due to moderate to severe agitation.  Will open eyes, but does not track, will move bilateral UE. Questionable history of EtOH/Narcotic abuse by report.  SIGNIFICANT EVENTS  9/24>>R fem HD cath placed by Surgery for temp HD.  9/27>>intubated, respiratory distress, anterior cords, thick secretions, intubated by anesthesia 9/27>>RIJ CVL, Bronch with BAL (LUL)>>thin secretions blood thin secretions, no endobronchial lesions.       PAST MEDICAL HISTORY    :  Past Medical History  Diagnosis Date  . Hypertension   . COPD (chronic obstructive pulmonary disease)   . Chronic kidney disease   . Chronic back pain greater than 3 months duration   . Phlebitis 1992    left calf  . Anxiety and depression   . CHF (congestive heart failure)   . Hepatitis B    Past Surgical History  Procedure Laterality Date  . Wisdom tooth extraction     Prior to Admission medications   Medication Sig Start Date End Date Taking? Authorizing Malayzia Laforte  albuterol (PROVENTIL HFA;VENTOLIN HFA) 108 (90 BASE) MCG/ACT inhaler Inhale 2 puffs into the lungs every 6 (six) hours as needed for wheezing or shortness of breath.   Yes Historical Carline Dura, MD  amLODipine (NORVASC) 10 MG tablet Take 1 tablet (10 mg total) by mouth daily. 05/05/15  Yes RGladstone Lighter MD  aspirin EC 81 MG tablet Take 81 mg by mouth daily.   Yes Historical Nitika Jackowski, MD  atorvastatin (LIPITOR) 20 MG tablet Take 20 mg by mouth at bedtime.    Yes Historical Daden Mahany, MD  carvedilol (COREG) 12.5 MG tablet Take 12.5 mg by mouth 2 (two) times daily.   Yes Historical Ryder Man, MD  clopidogrel (PLAVIX) 75 MG tablet Take 75 mg by mouth daily.   Yes Historical Aiyonna Lucado, MD  Fluticasone-Salmeterol (ADVAIR DISKUS) 250-50 MCG/DOSE AEPB Inhale 1 puff into the lungs 2 (two) times daily. 05/05/15  Yes RGladstone Lighter MD  gabapentin (NEURONTIN) 300 MG capsule Take 300 mg by  mouth 3 (three) times daily.   Yes Historical Jenette Rayson, MD  hydrALAZINE (APRESOLINE) 25 MG tablet Take 1 tablet (25 mg total) by mouth every 8 (eight) hours. 05/05/15  Yes Gladstone Lighter, MD  HYDROcodone-acetaminophen (NORCO/VICODIN) 5-325 MG per tablet Take 1 tablet by mouth every 6 (six) hours as needed for moderate pain. 05/05/15  Yes Gladstone Lighter, MD  isosorbide mononitrate (IMDUR) 30 MG 24 hr tablet Take 30 mg by mouth daily.   Yes Historical Nayara Taplin, MD  lactulose (CHRONULAC) 10 GM/15ML solution  Take 45 mLs (30 g total) by mouth 2 (two) times daily. 05/05/15  Yes Gladstone Lighter, MD  nitroGLYCERIN (NITROSTAT) 0.4 MG SL tablet Place 1 tablet (0.4 mg total) under the tongue every 5 (five) minutes as needed for chest pain. 05/13/15  Yes Alisa Graff, FNP  pantoprazole (PROTONIX) 40 MG tablet Take 40 mg by mouth at bedtime.    Yes Historical Coraima Tibbs, MD  potassium chloride SA (K-DUR,KLOR-CON) 20 MEQ tablet Take 1 tablet (20 mEq total) by mouth 2 (two) times daily. 05/13/15  Yes Alisa Graff, FNP  spironolactone (ALDACTONE) 25 MG tablet Take 25 mg by mouth daily.   Yes Historical Sheina Mcleish, MD  tiotropium (SPIRIVA) 18 MCG inhalation capsule Place 18 mcg into inhaler and inhale daily.   Yes Historical Bonnie Overdorf, MD  torsemide (DEMADEX) 20 MG tablet Take 2 tablets (40 mg total) by mouth 2 (two) times daily. 05/13/15  Yes Alisa Graff, FNP  predniSONE (STERAPRED UNI-PAK 21 TAB) 10 MG (21) TBPK tablet Take 1 tablet (10 mg total) by mouth daily. 6 tabs PO x 1 day 5 tabs PO x 1 day 4 tabs PO x 1 day 3 tabs PO x 1 day 2 tabs PO x 1 day 1 tab PO x 1 day and stop Patient not taking: Reported on 05/18/2015 05/05/15   Gladstone Lighter, MD   Allergies  Allergen Reactions  . Morphine And Related Other (See Comments)    Reaction:  Headaches      FAMILY HISTORY   Family History  Problem Relation Age of Onset  . Stroke Mother   . Cancer Mother     Breast      SOCIAL HISTORY    reports that he quit smoking about 5 months ago. His smoking use included Cigarettes. He has a 5 pack-year smoking history. He has never used smokeless tobacco. He reports that he does not drink alcohol or use illicit drugs.  Review of Systems  Unable to perform ROS: critical illness      VITAL SIGNS    Temp:  [97.8 F (36.6 C)-98.8 F (37.1 C)] 98.6 F (37 C) (09/28 0750) Pulse Rate:  [40-138] 130 (09/28 0814) Resp:  [12-23] 12 (09/28 0751) BP: (70-181)/(48-111) 167/111 mmHg (09/28 0814) SpO2:  [87  %-99 %] 88 % (09/28 0751) FiO2 (%):  [50 %-100 %] 60 % (09/28 0818) Weight:  [373 lb 14.4 oz (169.6 kg)-390 lb 10.5 oz (177.2 kg)] 373 lb 14.4 oz (169.6 kg) (09/28 0606) HEMODYNAMICS:   VENTILATOR SETTINGS: Vent Mode:  [-] PRVC FiO2 (%):  [50 %-100 %] 60 % Set Rate:  [20 bmp] 20 bmp Vt Set:  [500 mL] 500 mL PEEP:  [8 VOP92-92 cmH20] 10 cmH20 Plateau Pressure:  [21 cmH20-22 cmH20] 22 cmH20 INTAKE / OUTPUT:  Intake/Output Summary (Last 24 hours) at 05/22/15 0902 Last data filed at 05/22/15 0556  Gross per 24 hour  Intake 1433.93 ml  Output   5442 ml  Net -4008.07  ml       PHYSICAL EXAM   Physical Exam  Constitutional: He appears well-developed. No distress.  Intubated   HENT:  Head: Normocephalic.  Eyes: Pupils are equal, round, and reactive to light.  Does not track with eyes, but will open them, somewhat pinpoint pupils.   Neck: Normal range of motion.  Cardiovascular: Normal heart sounds and intact distal pulses.   Pulmonary/Chest: No respiratory distress. He has no wheezes. He has rales. He exhibits no tenderness.  Intubated  Coarse upper airway sounds.  Dec breath sounds at the bases.   Abdominal: Soft. He exhibits no distension. There is no tenderness.  Musculoskeletal: He exhibits edema.  Generalized edema  Neurological:  Unable to perform CN exam. GCS 12T  Skin: Skin is dry.  Dry scaled lesions (psoriasis appearance) on right leg       LABS   LABS:  CBC  Recent Labs Lab 05/03/2015 0517 05/08/2015 1457 05/22/15 0450  WBC 8.6 9.6 9.0  HGB 9.8* 9.8* 9.8*  HCT 30.4* 30.6* 30.7*  PLT 85* 58* 52*   Coag's No results for input(s): APTT, INR in the last 168 hours. BMET  Recent Labs Lab 05/20/15 1310 04/27/2015 0517 05/22/15 0450  NA 137 137 139  K 4.7 4.8 4.4  CL 101 103 101  CO2 28 26 30   BUN 70* 78* 68*  CREATININE 3.80* 4.39* 3.87*  GLUCOSE 164* 136* 125*   Electrolytes  Recent Labs Lab 05/19/15 1024  05/20/15 1310 05/13/2015 0517  05/22/15 0450  CALCIUM  --   < > 8.2* 8.4* 8.2*  MG  --   --   --  2.5*  --   PHOS 8.3*  --   --  7.4*  --   < > = values in this interval not displayed. Sepsis Markers No results for input(s): LATICACIDVEN, PROCALCITON, O2SATVEN in the last 168 hours. ABG  Recent Labs Lab 05/20/15 2149 05/19/2015 0450 05/10/2015 0830  PHART 7.35 7.38 7.18*  PCO2ART 47 44 74*  PO2ART 73* 80* 92   Liver Enzymes  Recent Labs Lab 05/10/2015 1402  AST 14*  ALT 15*  ALKPHOS 62  BILITOT 0.8  ALBUMIN 3.0*   Cardiac Enzymes  Recent Labs Lab 05/03/2015 1402  TROPONINI 0.03   Glucose  Recent Labs Lab 05/11/2015 1143 05/02/2015 1612 04/28/2015 2000 05/03/2015 2354 05/22/15 0440 05/22/15 0740  GLUCAP 114* 131* 154* 133* 118* 111*     Recent Results (from the past 240 hour(s))  Culture, blood (routine x 2)     Status: None   Collection Time: 05/08/2015  4:10 PM  Result Value Ref Range Status   Specimen Description BLOOD RIGHT AC  Final   Special Requests BOTTLES DRAWN AEROBIC AND ANAEROBIC  [place]  Final   Culture NO GROWTH 5 DAYS  Final   Report Status 05/22/2015 FINAL  Final  Culture, blood (routine x 2)     Status: None   Collection Time: 05/24/2015  4:18 PM  Result Value Ref Range Status   Specimen Description BLOOD LEFT HAND  Final   Special Requests BOTTLES DRAWN AEROBIC AND ANAEROBIC  4CC  Final   Culture NO GROWTH 5 DAYS  Final   Report Status 05/22/2015 FINAL  Final  MRSA PCR Screening     Status: None   Collection Time: 05/20/15  1:53 PM  Result Value Ref Range Status   MRSA by PCR NEGATIVE NEGATIVE Final    Comment:        The  GeneXpert MRSA Assay (FDA approved for NASAL specimens only), is one component of a comprehensive MRSA colonization surveillance program. It is not intended to diagnose MRSA infection nor to guide or monitor treatment for MRSA infections.   Culture, blood (routine x 2)     Status: None (Preliminary result)   Collection Time: 05/06/2015  2:52 PM  Result  Value Ref Range Status   Specimen Description BLOOD LEFT ASSIST CONTROL  Final   Special Requests BAA 8CC AER [place]  Final   Culture NO GROWTH < 24 HOURS  Final   Report Status PENDING  Incomplete  Culture, blood (routine x 2)     Status: None (Preliminary result)   Collection Time: 05/14/2015  3:44 PM  Result Value Ref Range Status   Specimen Description BLOOD LEFT ASSIST CONTROL  Final   Special Requests NONE  Final   Culture NO GROWTH < 24 HOURS  Final   Report Status PENDING  Incomplete     Current facility-administered medications:  .  0.9 %  sodium chloride infusion, 100 mL, Intravenous, PRN, Munsoor Lateef, MD .  0.9 %  sodium chloride infusion, 100 mL, Intravenous, PRN, Munsoor Lateef, MD .  albuterol (PROVENTIL) (2.5 MG/3ML) 0.083% nebulizer solution 2.5 mg, 2.5 mg, Nebulization, Q6H PRN, Loletha Grayer, MD .  alteplase (CATHFLO ACTIVASE) injection 2 mg, 2 mg, Intracatheter, Once PRN, Munsoor Lateef, MD .  Margrett Rud amiodarone (NEXTERONE) 1.8 mg/mL load via infusion 150 mg, 150 mg, Intravenous, Once, 150 mg at 04/28/2015 1703 **FOLLOWED BY** [EXPIRED] amiodarone (NEXTERONE PREMIX) 360 MG/200ML (1.8 mg/mL) IV infusion, 60 mg/hr, Intravenous, Continuous, Last Rate: 33.3 mL/hr at 05/10/2015 1658, 60 mg/hr at 05/04/2015 1658 **FOLLOWED BY** amiodarone (NEXTERONE PREMIX) 360 MG/200ML (1.8 mg/mL) IV infusion, 30 mg/hr, Intravenous, Continuous, Demetrios Loll, MD, Last Rate: 16.7 mL/hr at 05/22/15 0556, 30 mg/hr at 05/22/15 0556 .  amLODipine (NORVASC) tablet 10 mg, 10 mg, Oral, Daily, Demetrios Loll, MD, 10 mg at 05/20/15 1400 .  antiseptic oral rinse solution (CORINZ), 7 mL, Mouth Rinse, QID, Vishal Mungal, MD, 7 mL at 05/22/15 0400 .  aspirin EC tablet 81 mg, 81 mg, Oral, Daily, Loletha Grayer, MD, 81 mg at 05/18/15 1030 .  atorvastatin (LIPITOR) tablet 20 mg, 20 mg, Oral, QHS, Loletha Grayer, MD, 20 mg at 04/26/2015 2140 .  carvedilol (COREG) tablet 3.125 mg, 3.125 mg, Oral, BID WC, Demetrios Loll, MD,  3.125 mg at 05/22/15 0814 .  chlorhexidine gluconate (PERIDEX) 0.12 % solution 15 mL, 15 mL, Mouth Rinse, BID, Vishal Mungal, MD, 15 mL at 05/22/15 0832 .  clopidogrel (PLAVIX) tablet 75 mg, 75 mg, Oral, Daily, Loletha Grayer, MD, 75 mg at 05/05/2015 1110 .  feeding supplement (PRO-STAT SUGAR FREE 64) liquid 30 mL, 30 mL, Oral, BID, Vishal Mungal, MD, 30 mL at 05/04/2015 2142 .  feeding supplement (VITAL HIGH PROTEIN) liquid 1,000 mL, 1,000 mL, Per Tube, Q24H, Vishal Mungal, MD .  fentaNYL (SUBLIMAZE) bolus via infusion 50 mcg, 50 mcg, Intravenous, Q1H PRN, Vilinda Boehringer, MD, 50 mcg at 05/22/15 0857 .  fentaNYL 2533mg in NS 2533m(1031mml) infusion-PREMIX, 25-400 mcg/hr, Intravenous, Continuous, Vishal Mungal, MD, Last Rate: 20 mL/hr at 05/22/15 0856, 200 mcg/hr at 05/22/15 0856 .  fluticasone (FLONASE) 50 MCG/ACT nasal spray 2 spray, 2 spray, Each Nare, Daily, QinDemetrios LollD, 2 spray at 05/19/15 1117 .  free water 100 mL, 100 mL, Per Tube, 3 times per day, Vishal Mungal, MD, 100 mL at 05/22/15 0600 .  furosemide (LASIX) injection 80 mg,  80 mg, Intravenous, BID, Loletha Grayer, MD, 80 mg at 05/22/15 0355 .  heparin injection 1,000 Units, 1,000 Units, Intracatheter, PRN, Munsoor Lateef, MD, 2,600 Units at 05/20/15 1234 .  heparin injection 5,000 Units, 5,000 Units, Subcutaneous, 3 times per day, Loletha Grayer, MD, 5,000 Units at 05/22/15 0556 .  hydrALAZINE (APRESOLINE) injection 10 mg, 10 mg, Intravenous, Q6H PRN, Demetrios Loll, MD, 10 mg at 05/20/2015 0405 .  hydrALAZINE (APRESOLINE) tablet 25 mg, 25 mg, Oral, 3 times per day, Demetrios Loll, MD, 25 mg at 05/22/15 0556 .  HYDROcodone-acetaminophen (NORCO/VICODIN) 5-325 MG per tablet 1 tablet, 1 tablet, Oral, Q6H PRN, Loletha Grayer, MD, 1 tablet at 05/19/15 1953 .  insulin aspart (novoLOG) injection 2-6 Units, 2-6 Units, Subcutaneous, 6 times per day, Elsie Stain, MD, 2 Units at 05/22/15 0003 .  ipratropium-albuterol (DUONEB) 0.5-2.5 (3) MG/3ML  nebulizer solution 3 mL, 3 mL, Nebulization, Q4H, Elsie Stain, MD, 3 mL at 05/22/15 0740 .  isosorbide mononitrate (IMDUR) 24 hr tablet 30 mg, 30 mg, Oral, Daily, Loletha Grayer, MD, 30 mg at 05/19/15 1035 .  lactulose (CHRONULAC) 10 GM/15ML solution 30 g, 30 g, Oral, BID, Loletha Grayer, MD, 30 g at 05/19/2015 2141 .  lidocaine (PF) (XYLOCAINE) 1 % injection 5 mL, 5 mL, Intradermal, PRN, Munsoor Lateef, MD .  lidocaine-prilocaine (EMLA) cream 1 application, 1 application, Topical, PRN, Munsoor Lateef, MD .  methylPREDNISolone sodium succinate (SOLU-MEDROL) 125 mg/2 mL injection 60 mg, 60 mg, Intravenous, Daily, Demetrios Loll, MD, 60 mg at 05/20/2015 1005 .  midazolam (VERSED) injection 2 mg, 2 mg, Intravenous, Q15 min PRN, Vishal Mungal, MD .  midazolam (VERSED) injection 2 mg, 2 mg, Intravenous, Q2H PRN, Vishal Mungal, MD, 2 mg at 05/22/15 0419 .  nitroGLYCERIN (NITROSTAT) SL tablet 0.4 mg, 0.4 mg, Sublingual, Q5 min PRN, Loletha Grayer, MD .  pantoprazole (PROTONIX) injection 40 mg, 40 mg, Intravenous, Q24H, Elsie Stain, MD, 40 mg at 04/27/2015 2141 .  pentafluoroprop-tetrafluoroeth (GEBAUERS) aerosol 1 application, 1 application, Topical, PRN, Munsoor Lateef, MD .  piperacillin-tazobactam (ZOSYN) IVPB 3.375 g, 3.375 g, Intravenous, Q12H, Vishal Mungal, MD, 3.375 g at 05/19/2015 2142 .  senna-docusate (Senokot-S) tablet 1 tablet, 1 tablet, Per Tube, BID PRN, Lenis Noon, RPH .  sodium chloride 0.9 % injection 3 mL, 3 mL, Intravenous, PRN, Demetrios Loll, MD .  sodium chloride 0.9 % injection 3 mL, 3 mL, Intravenous, Q12H, Demetrios Loll, MD, 3 mL at 05/01/2015 2142 .  vancomycin (VANCOCIN) IVPB 1000 mg/200 mL premix, 1,000 mg, Intravenous, Q dialysis, Vishal Mungal, MD  IMAGING    Dg Abd 1 View  05/16/2015   CLINICAL DATA:  Nasogastric tube placement  EXAM: ABDOMEN - 1 VIEW  COMPARISON:  Study obtained earlier in the day  FINDINGS: Nasogastric to tip and side port are below the diaphragm in the region  of the stomach. The bowel gas pattern appears unremarkable in the visualized regions. There is extensive lower lobe infiltrate bilaterally.  IMPRESSION: Nasogastric tube tip and side port in stomach. Visualized bowel gas pattern grossly normal.   Electronically Signed   By: Lowella Grip III M.D.   On: 05/11/2015 18:04   Dg Abd 1 View  05/03/2015   CLINICAL DATA:  OG tube placement.  EXAM: ABDOMEN - 1 VIEW  COMPARISON:  None.  FINDINGS: No orogastric tube is identified. There is no bowel dilatation to suggest obstruction. There is no evidence of pneumoperitoneum, portal venous gas or pneumatosis. There are no pathologic calcifications  along the expected course of the ureters.The osseous structures are unremarkable.  IMPRESSION: No orogastric tube is visualized below the diaphragm.   Electronically Signed   By: Kathreen Devoid   On: 05/22/2015 16:24   Dg Chest Port 1 View  04/25/2015   CLINICAL DATA:  Acute respiratory distress. Intubated. Line placement.  EXAM: PORTABLE CHEST 1 VIEW  COMPARISON:  Chest radiograph from earlier today.  FINDINGS: Endotracheal tube tip is 3.3 cm above the carina. Right internal jugular central venous catheter terminates in the right atrium near the cavoatrial junction. Enteric tube enters the lower thoracic esophagus, with the tip not definitely visualized on this radiograph. Stable cardiomediastinal silhouette with moderate cardiomegaly. No pneumothorax. Stable small bilateral pleural effusions. Severe patchy airspace opacities are noted in the parahilar and basilar lungs bilaterally, not appreciably changed.  IMPRESSION: 1. Well-positioned endotracheal tube and right internal jugular central venous catheter. No pneumothorax. 2. Enteric tube enters the lower thoracic esophagus, with the tip not definitely seen on this image. 3. Stable moderate cardiomegaly and severe patchy parahilar and basilar airspace opacities bilaterally, favor severe pulmonary edema due to congestive heart  failure. 4. Stable small bilateral pleural effusions.   Electronically Signed   By: Ilona Sorrel M.D.   On: 05/07/2015 16:31      Indwelling Urinary Catheter continued, requirement due to   Reason to continue Indwelling Urinary Catheter for strict Intake/Output monitoring for hemodynamic instability   Central Line continued, requirement due to   Reason to continue [employer] Monitoring of central venous pressure or other hemodynamic parameters   Ventilator continued, requirement due to, resp failure    Ventilator Sedation RASS 0 to -2      ASSESSMENT/PLAN  54 yo male with CKD, dCHF, COPD on supplemental O2, now with acute respiratory failure on 9/26, intubated.   PULMONARY Acute respiratory failure Requirement for MV Mixed Resp and met acidosis Encephalopathy P:   - cont with MV, wean as tolerated - agitated this morning - prn ABG - CXR stat - thick secretions noted during intubation>>bronch>>BAL>>thin secretions blood thin secretions - Bal, resp culture  CARDIOVASCULAR dchf hypertension P:  - plan for temp HD - cont with fluid restriction and monitoring - cardiology following - plan for cardiac cath but patient exceeds the [facility] cath table weight limit, cardiology recommending transfer to another facility once respiratory status is more stable for cath.  - monitor BP  RENAL Met acidosis CKD Oliguria Acute renal failure P:   -cont with HD - renal following -electrolyte replacement during dialysis - improving Cr.   GASTROINTESTINAL PPI prophylaxis OGT   INFECTIOUS Empiric abx with vanc and zosyn Follow up on current cultures.   P:   BCx2 - neg x 3 days BCx2 9/27>> UC - neg BAL- pending Abx: vanc/zosyn (renally dose), start date 9/27  LINES: 9/25>>R HD Fem cath  9/27>>RIJ CVL  ENDOCRINE ICU hypo/hyperglycemic protocol P:   -ssi  NEUROLOGIC A:   Encephalopathy - metabolic Lethargy - improved ?withdrawal - EtOH vs narcotic or both.  P:    - correct electrolytes - treat any underlying infection. - hold gabapentin - consider scheduling narcotic if agitation becomes worst.  -RASS goal: -1  SOCIAL  - I have spoken to the patient's father (HCPOA). Father stated that patient does not have any other family (no children, never married). Father updated on current clinical status.  Father stated that patient is a FULL CODE  Today's Summary: 54 yo m with dchf, copd, acute on  chronic respiratory failure. Receiving hemodialysis, still with issues of agitation and possible narcotic versus alcohol withdrawal. Plan today is to continue with dialysis, schedule Dilaudid for possible necrotic withdrawal, and continue her current medical care. Reassess for SBT and wake up assessment in the morning  I have personally obtained a history, examined the patient, evaluated laboratory and imaging results, formulated the assessment and plan and placed orders.  The Patient requires high complexity decision making for assessment and support, frequent evaluation and titration of therapies, application of advanced monitoring technologies and extensive interpretation of multiple databases. Critical Care Time devoted to patient care services described in this note is 65 minutes.   Overall, patient is critically ill, prognosis is guarded. Patient at high risk for cardiac arrest and death.   Vilinda Boehringer, MD [facility] Pager [phone] (please enter 7-digits) On Call Pager - [phone] (please enter 7-digits)     05/22/2015, 9:02 AM

## 2015-05-22 NOTE — Progress Notes (Signed)
Spoke with Freeman Caldron, PA of Dr. Welton Flakes, about patient's first troponin value of 0.14. No new orders.

## 2015-05-22 NOTE — Progress Notes (Signed)
Subjective:   Was started on temporary dialysis after failure of diuretic therapy 1st HD done on 9/25 (sunday) Patient remains critically ill Remains intubated and sedated with periods of agitation HR in 130's Continues to have significant edema  Objective:  Vital signs in last 24 hours:  Temp:  [97.8 F (36.6 C)-98.8 F (37.1 C)] 98.6 F (37 C) (09/28 0750) Pulse Rate:  [40-138] 102 (09/28 0751) Resp:  [10-35] 12 (09/28 0751) BP: (70-186)/(48-93) 130/67 mmHg (09/28 0700) SpO2:  [55 %-99 %] 88 % (09/28 0751) FiO2 (%):  [50 %-100 %] 60 % (09/28 0818) Weight:  [169.6 kg (373 lb 14.4 oz)-177.2 kg (390 lb 10.5 oz)] 169.6 kg (373 lb 14.4 oz) (09/28 0606)  Weight change: 2.2 kg (4 lb 13.6 oz) Filed Weights   05/16/2015 0930 05/12/2015 1335 05/22/15 0606  Weight: 177.2 kg (390 lb 10.5 oz) 174.2 kg (384 lb 0.7 oz) 169.6 kg (373 lb 14.4 oz)    Intake/Output: I/O last 3 completed shifts: In: 1433.9 [I.V.:703.9; NG/GT:230; IV Piggyback:500] Out: 5442 [Urine:975; Emesis/NG output:900; Other:3567]   Intake/Output this shift:     Physical Exam: General: morbidly obese NAD,  Critically ill appearing  Head: Normocephalic, atraumatic.    E/ENT: Anicteric, moist oral mucus membranes  Neck: Supple,    Lungs:  diffuse bilateral rales, vent assisted  Heart: S1S2 no rubs  Abdomen:  Soft, nontender, obese, lower abdominal wall edema noted  Extremities: 3+ b/l LE edema, rt fem temp cath  Neurologic: Periods of agitation and sedation  Skin: Red blanching rash over upper torso neck, shoulders       Basic Metabolic Panel:  Recent Labs Lab 05/19/15 0442 05/19/15 1024 05/20/15 0428 05/20/15 1310 05/11/2015 0517 05/22/15 0450  NA 134*  --  135 137 137 139  K 5.2*  --  5.8* 4.7 4.8 4.4  CL 102  --  102 101 103 101  CO2 23  --  GLUCOSE 212*  --  225* 164* 136* 125*  BUN 89*  --  89* 70* 78* 68*  CREATININE 4.89*  --  4.76* 3.80* 4.39* 3.87*  CALCIUM 8.2*  --  8.4* 8.2*  8.4* 8.2*  MG  --   --   --   --  2.5*  --   PHOS  --  8.3*  --   --  7.4*  --     Liver Function Tests:  Recent Labs Lab 2015-06-14 1402  AST 14*  ALT 15*  ALKPHOS 62  BILITOT 0.8  PROT 6.2*  ALBUMIN 3.0*   No results for input(s): LIPASE, AMYLASE in the last 168 hours.  Recent Labs Lab 04/28/2015 0517  AMMONIA 105*    CBC:  Recent Labs Lab 05/18/15 0514 05/20/15 1224 04/30/2015 0517 05/18/2015 1457 05/22/15 0450  WBC 3.6* 5.6 8.6 9.6 9.0  HGB 9.7* 10.1* 9.8* 9.8* 9.8*  HCT 31.5* 31.4* 30.4* 30.6* 30.7*  MCV 85.7 83.7 82.6 83.0 83.4  PLT 68* 75* 85* 58* 52*    Cardiac Enzymes:  Recent Labs Lab 06-14-15 1402  TROPONINI 0.03    BNP: Invalid input(s): POCBNP  CBG:  Recent Labs Lab 05/24/2015 1612 05/07/2015 2000 05/10/2015 2354 05/22/15 0440 05/22/15 0740  GLUCAP 131* 154* 133* 118* 111*    Microbiology: Results for orders placed or performed during the hospital encounter of 14-Jun-2015  Culture, blood (routine x 2)     Status: None (Preliminary result)   Collection Time: 2015/06/14  4:10 PM  Result  Value Ref Range Status   Specimen Description BLOOD RIGHT AC  Final   Special Requests BOTTLES DRAWN AEROBIC AND ANAEROBIC  9CC  Final   Culture NO GROWTH 4 DAYS  Final   Report Status PENDING  Incomplete  Culture, blood (routine x 2)     Status: None (Preliminary result)   Collection Time: 05/30/2015  4:18 PM  Result Value Ref Range Status   Specimen Description BLOOD LEFT HAND  Final   Special Requests BOTTLES DRAWN AEROBIC AND ANAEROBIC  4CC  Final   Culture NO GROWTH 4 DAYS  Final   Report Status PENDING  Incomplete  MRSA PCR Screening     Status: None   Collection Time: 05/20/15  1:53 PM  Result Value Ref Range Status   MRSA by PCR NEGATIVE NEGATIVE Final    Comment:        The GeneXpert MRSA Assay (FDA approved for NASAL specimens only), is one component of a comprehensive MRSA colonization surveillance program. It is not intended to diagnose  MRSA infection nor to guide or monitor treatment for MRSA infections.     Coagulation Studies: No results for input(s): LABPROT, INR in the last 72 hours.  Urinalysis: No results for input(s): COLORURINE, LABSPEC, PHURINE, GLUCOSEU, HGBUR, BILIRUBINUR, KETONESUR, PROTEINUR, UROBILINOGEN, NITRITE, LEUKOCYTESUR in the last 72 hours.  Invalid input(s): APPERANCEUR    Imaging: Dg Abd 1 View  05/22/2015   CLINICAL DATA:  Nasogastric tube placement  EXAM: ABDOMEN - 1 VIEW  COMPARISON:  Study obtained earlier in the day  FINDINGS: Nasogastric to tip and side port are below the diaphragm in the region of the stomach. The bowel gas pattern appears unremarkable in the visualized regions. There is extensive lower lobe infiltrate bilaterally.  IMPRESSION: Nasogastric tube tip and side port in stomach. Visualized bowel gas pattern grossly normal.   Electronically Signed   By: Bretta Bang III M.D.   On: 05/12/2015 18:04   Dg Abd 1 View  05/24/2015   CLINICAL DATA:  OG tube placement.  EXAM: ABDOMEN - 1 VIEW  COMPARISON:  None.  FINDINGS: No orogastric tube is identified. There is no bowel dilatation to suggest obstruction. There is no evidence of pneumoperitoneum, portal venous gas or pneumatosis. There are no pathologic calcifications along the expected course of the ureters.The osseous structures are unremarkable.  IMPRESSION: No orogastric tube is visualized below the diaphragm.   Electronically Signed   By: Elige Ko   On: 04/26/2015 16:24   Dg Chest Port 1 View  05/10/2015   CLINICAL DATA:  Acute respiratory distress. Intubated. Line placement.  EXAM: PORTABLE CHEST 1 VIEW  COMPARISON:  Chest radiograph from earlier today.  FINDINGS: Endotracheal tube tip is 3.3 cm above the carina. Right internal jugular central venous catheter terminates in the right atrium near the cavoatrial junction. Enteric tube enters the lower thoracic esophagus, with the tip not definitely visualized on this  radiograph. Stable cardiomediastinal silhouette with moderate cardiomegaly. No pneumothorax. Stable small bilateral pleural effusions. Severe patchy airspace opacities are noted in the parahilar and basilar lungs bilaterally, not appreciably changed.  IMPRESSION: 1. Well-positioned endotracheal tube and right internal jugular central venous catheter. No pneumothorax. 2. Enteric tube enters the lower thoracic esophagus, with the tip not definitely seen on this image. 3. Stable moderate cardiomegaly and severe patchy parahilar and basilar airspace opacities bilaterally, favor severe pulmonary edema due to congestive heart failure. 4. Stable small bilateral pleural effusions.   Electronically Signed   By: Barbara Cower  A Poff M.D.   On: 05/18/2015 16:31   Dg Chest Port 1 View  05/24/2015   CLINICAL DATA:  Status post intubation.  EXAM: PORTABLE CHEST 1 VIEW  COMPARISON:  May 20, 2015.  FINDINGS: Stable cardiomegaly with central pulmonary vascular congestion. Increased bilateral perihilar and basilar densities are noted concerning for worsening pulmonary edema. Endotracheal tube is in grossly good position with distal tip 5.6 cm above the carina. No pneumothorax is noted. Probable bilateral pleural effusions are noted. Bony thorax is unremarkable.  IMPRESSION: Endotracheal tube in grossly good position. Stable cardiomegaly is noted, with increased bilateral perihilar and basilar densities concerning for worsening pulmonary edema and possible associated pleural effusions. These findings are concerning for congestive heart failure.   Electronically Signed   By: Lupita Raider, M.D.   On: 05/07/2015 08:50   Dg Chest Port 1 View  05/20/2015   CLINICAL DATA:  Hypoxia and altered mental status today.  EXAM: PORTABLE CHEST 1 VIEW  COMPARISON:  06-12-15  FINDINGS: The heart size and mediastinal contours are stable. The heart size is enlarged. There is pulmonary edema. There is no focal pneumonia. The visualized  skeletal structures are stable.  IMPRESSION: Congestive heart failure.   Electronically Signed   By: Sherian Rein M.D.   On: 05/20/2015 21:48     Medications:   . amiodarone 30 mg/hr (05/22/15 0556)  . fentaNYL infusion INTRAVENOUS 150 mcg/hr (05/22/15 0753)   . amLODipine  10 mg Oral Daily  . antiseptic oral rinse  7 mL Mouth Rinse QID  . aspirin EC  81 mg Oral Daily  . atorvastatin  20 mg Oral QHS  . carvedilol  3.125 mg Oral BID WC  . chlorhexidine gluconate  15 mL Mouth Rinse BID  . clopidogrel  75 mg Oral Daily  . feeding supplement (PRO-STAT SUGAR FREE 64)  30 mL Oral BID  . feeding supplement (VITAL HIGH PROTEIN)  1,000 mL Per Tube Q24H  . fluticasone  2 spray Each Nare Daily  . free water  100 mL Per Tube 3 times per day  . furosemide  80 mg Intravenous BID  . heparin  5,000 Units Subcutaneous 3 times per day  . hydrALAZINE  25 mg Oral 3 times per day  . insulin aspart  2-6 Units Subcutaneous 6 times per day  . ipratropium-albuterol  3 mL Nebulization Q4H  . isosorbide mononitrate  30 mg Oral Daily  . lactulose  30 g Oral BID  . methylPREDNISolone (SOLU-MEDROL) injection  60 mg Intravenous Daily  . pantoprazole (PROTONIX) IV  40 mg Intravenous Q24H  . piperacillin-tazobactam (ZOSYN)  IV  3.375 g Intravenous Q12H  . sodium chloride  3 mL Intravenous Q12H   sodium chloride, sodium chloride, albuterol, alteplase, fentaNYL, heparin, hydrALAZINE, HYDROcodone-acetaminophen, lidocaine (PF), lidocaine-prilocaine, midazolam, midazolam, nitroGLYCERIN, pentafluoroprop-tetrafluoroeth, senna-docusate, sodium chloride, vancomycin  Assessment/ Plan:  [name] is a 54 y.o.  white male with hepatits B, hypertension, COPD, anemia, thrombocytopenia, morbid obesity, tobacco abuse.  1. 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 Creatinine baseline of 3.5 from 9/16.  Acute renal failure due to acute  exacerbation of diastolic heart failure. - Renal function remains poor worse, pt also with  massive volume overload.   - started on HD. 1st treatment was on 9/25   - plan for another session of HD today for volume removal - hold gabapentin - may be causing somnolence. May  restart after mental status normalized , use renally adjusted dose of 300 mg po qhs  2. Pulmonary edema and Acute diastolic heart failure:  Significant weight gain noted in a short period of time, lasix doesn't appear to be fully effective, will opt for HD for now.  3.  Massive generalized edema R60.1:  Significant edema noted,  Will plan for HD, may need daily HD for a bit. 3500 cc removed yesterday    LOS: 5 SINGH,HARMEET 9/28/20168:23 AM

## 2015-05-23 LAB — GLUCOSE, CAPILLARY
GLUCOSE-CAPILLARY: 158 mg/dL — AB (ref 65–99)
GLUCOSE-CAPILLARY: 147 mg/dL — AB (ref 65–99)
GLUCOSE-CAPILLARY: 130 mg/dL — AB (ref 65–99)
Glucose-Capillary: 131 mg/dL — ABNORMAL HIGH (ref 65–99)
Glucose-Capillary: 161 mg/dL — ABNORMAL HIGH (ref 65–99)
Glucose-Capillary: 158 mg/dL — ABNORMAL HIGH (ref 65–99)

## 2015-05-23 LAB — COMPREHENSIVE METABOLIC PANEL
ALK PHOS: 50 U/L (ref 38–126)
ALT: 13 U/L — ABNORMAL LOW (ref 17–63)
ANION GAP: 9 (ref 5–15)
AST: 16 U/L (ref 15–41)
Albumin: 3 g/dL — ABNORMAL LOW (ref 3.5–5.0)
BUN: 70 mg/dL — ABNORMAL HIGH (ref 6–20)
CALCIUM: 8.4 mg/dL — AB (ref 8.9–10.3)
CO2: 30 mmol/L (ref 22–32)
Chloride: 101 mmol/L (ref 101–111)
Creatinine, Ser: 3.48 mg/dL — ABNORMAL HIGH (ref 0.61–1.24)
GFR calc non Af Amer: 18 mL/min — ABNORMAL LOW (ref >60)
GFR, EST AFRICAN AMERICAN: 21 mL/min — AB (ref >60)
Glucose, Bld: 144 mg/dL — ABNORMAL HIGH (ref 65–99)
Potassium: 4.1 mmol/L (ref 3.5–5.1)
SODIUM: 140 mmol/L (ref 135–145)
Total Bilirubin: 1.5 mg/dL — ABNORMAL HIGH (ref 0.3–1.2)
Total Protein: 6.1 g/dL — ABNORMAL LOW (ref 6.5–8.1)

## 2015-05-23 LAB — CBC
HCT: 28 % — ABNORMAL LOW (ref 40.0–52.0)
HEMOGLOBIN: 9 g/dL — AB (ref 13.0–18.0)
MCH: 27.2 pg (ref 26.0–34.0)
MCHC: 32.2 g/dL (ref 32.0–36.0)
MCV: 84.4 fL (ref 80.0–100.0)
Platelets: 44 10*3/uL — ABNORMAL LOW (ref 150–440)
RBC: 3.32 MIL/uL — AB (ref 4.40–5.90)
RDW: 20.7 % — ABNORMAL HIGH (ref 11.5–14.5)
WBC: 4.6 10*3/uL (ref 3.8–10.6)

## 2015-05-23 LAB — BLOOD GAS, ARTERIAL
ACID-BASE EXCESS: 5.3 mmol/L — AB (ref 0.0–3.0)
Allens test (pass/fail): POSITIVE — AB
Bicarbonate: 31.9 mEq/L — ABNORMAL HIGH (ref 21.0–28.0)
FIO2: 0.6
MECHVT: 500 mL
Mechanical Rate: 26
O2 SAT: 95.6 %
PEEP/CPAP: 12 cmH2O
PH ART: 7.38 (ref 7.350–7.450)
PO2 ART: 81 mmHg — AB (ref 83.0–108.0)
Patient temperature: 37
RATE: 26 resp/min
pCO2 arterial: 54 mmHg — ABNORMAL HIGH (ref 32.0–48.0)

## 2015-05-23 MED ORDER — ANTICOAGULANT SODIUM CITRATE 4% (200MG/5ML) IV SOLN
5.0000 mL | Freq: Every day | Status: DC | PRN
  Filled 2015-05-23: qty 250

## 2015-05-23 MED ORDER — CARVEDILOL 12.5 MG PO TABS
12.5000 mg | ORAL_TABLET | Freq: Two times a day (BID) | ORAL | Status: DC
  Administered 2015-05-23 – 2015-05-26 (×6): 12.5 mg via ORAL
  Filled 2015-05-23 (×6): qty 1

## 2015-05-23 NOTE — Consult Note (Signed)
PULMONARY / CRITICAL CARE MEDICINE   Name: Gary Juarez MRN: [id number] DOB: Feb 08, 1961    ADMISSION DATE:  04/27/2015 CONSULTATION DATE: 05/20/15  REFERRING MD :  Dr. Verdell Carmine   CHIEF COMPLAINT:     Short of breath.    HISTORY OF PRESENT ILLNESS  Patient unable to provide history due to lethargy and respiratory distress.  History per chart review and speaking with father  05/13/2015 54 y.o. male with a known history of diastolic congestive heart failure, COPD and stage on oxygen, hepatitis B, chronic kidney disease stage IV, hypertension, presented with shortness of breath and weight gain on 05/06/2015. Per chart review he gained 10 pounds in 3 days. His nurse stopped visited him on 05/09/2015 and referred him into the hospital for heavy breathing. At the time of admission he felt tightness in his abdomen and also chest tightness. Not much cough. He does feel nauseous. Admitting physician stated per chart review that "talking with him he was shaking and very cold." Diuresis was attempted but unsuccessful, an HD cath was placed on 9/24.  On 9/25 temp HD was attempted but patient was noted to be agitated, he was given 76m IV ativan. Throughout the night, he was noted to be more lethargic and unresponsive, overnight MD, gave flumazenil, with very little improvement.  This morning, he was noted to be on bipap, with no alertness, heaving breathing, and in moderate respiratory distress.  His GCS <8    SUBJECTIVE: Patient more agitated and awake this morning, following commands, WUA in progress.   SIGNIFICANT EVENTS  9/24>>R fem HD cath placed by Surgery for temp HD.  9/27>>intubated, respiratory distress, anterior cords, thick secretions, intubated by anesthesia 9/27>>RIJ CVL, Bronch with BAL (LUL)>>thin secretions blood thin secretions, no endobronchial lesions.      PAST MEDICAL HISTORY    :  Past Medical History  Diagnosis Date  . Hypertension   . COPD (chronic obstructive pulmonary  disease)   . Chronic kidney disease   . Chronic back pain greater than 3 months duration   . Phlebitis 1992    left calf  . Anxiety and depression   . CHF (congestive heart failure)   . Hepatitis B    Past Surgical History  Procedure Laterality Date  . Wisdom tooth extraction     Prior to Admission medications   Medication Sig Start Date End Date Taking? Authorizing Provider  albuterol (PROVENTIL HFA;VENTOLIN HFA) 108 (90 BASE) MCG/ACT inhaler Inhale 2 puffs into the lungs every 6 (six) hours as needed for wheezing or shortness of breath.   Yes Historical Provider, MD  amLODipine (NORVASC) 10 MG tablet Take 1 tablet (10 mg total) by mouth daily. 05/05/15  Yes RGladstone Lighter MD  aspirin EC 81 MG tablet Take 81 mg by mouth daily.   Yes Historical Provider, MD  atorvastatin (LIPITOR) 20 MG tablet Take 20 mg by mouth at bedtime.    Yes Historical Provider, MD  carvedilol (COREG) 12.5 MG tablet Take 12.5 mg by mouth 2 (two) times daily.   Yes Historical Provider, MD  clopidogrel (PLAVIX) 75 MG tablet Take 75 mg by mouth daily.   Yes Historical Provider, MD  Fluticasone-Salmeterol (ADVAIR DISKUS) 250-50 MCG/DOSE AEPB Inhale 1 puff into the lungs 2 (two) times daily. 05/05/15  Yes RGladstone Lighter MD  gabapentin (NEURONTIN) 300 MG capsule Take 300 mg by mouth 3 (three) times daily.   Yes Historical Provider, MD  hydrALAZINE (APRESOLINE) 25 MG tablet Take 1 tablet (25 mg total)  by mouth every 8 (eight) hours. 05/05/15  Yes Gladstone Lighter, MD  HYDROcodone-acetaminophen (NORCO/VICODIN) 5-325 MG per tablet Take 1 tablet by mouth every 6 (six) hours as needed for moderate pain. 05/05/15  Yes Gladstone Lighter, MD  isosorbide mononitrate (IMDUR) 30 MG 24 hr tablet Take 30 mg by mouth daily.   Yes Historical Provider, MD  lactulose (CHRONULAC) 10 GM/15ML solution Take 45 mLs (30 g total) by mouth 2 (two) times daily. 05/05/15  Yes Gladstone Lighter, MD  nitroGLYCERIN (NITROSTAT) 0.4 MG SL tablet  Place 1 tablet (0.4 mg total) under the tongue every 5 (five) minutes as needed for chest pain. 05/13/15  Yes Alisa Graff, FNP  pantoprazole (PROTONIX) 40 MG tablet Take 40 mg by mouth at bedtime.    Yes Historical Provider, MD  potassium chloride SA (K-DUR,KLOR-CON) 20 MEQ tablet Take 1 tablet (20 mEq total) by mouth 2 (two) times daily. 05/13/15  Yes Alisa Graff, FNP  spironolactone (ALDACTONE) 25 MG tablet Take 25 mg by mouth daily.   Yes Historical Provider, MD  tiotropium (SPIRIVA) 18 MCG inhalation capsule Place 18 mcg into inhaler and inhale daily.   Yes Historical Provider, MD  torsemide (DEMADEX) 20 MG tablet Take 2 tablets (40 mg total) by mouth 2 (two) times daily. 05/13/15  Yes Alisa Graff, FNP  predniSONE (STERAPRED UNI-PAK 21 TAB) 10 MG (21) TBPK tablet Take 1 tablet (10 mg total) by mouth daily. 6 tabs PO x 1 day 5 tabs PO x 1 day 4 tabs PO x 1 day 3 tabs PO x 1 day 2 tabs PO x 1 day 1 tab PO x 1 day and stop Patient not taking: Reported on 05/22/2015 05/05/15   Gladstone Lighter, MD   Allergies  Allergen Reactions  . Morphine And Related Other (See Comments)    Reaction:  Headaches      FAMILY HISTORY   Family History  Problem Relation Age of Onset  . Stroke Mother   . Cancer Mother     Breast      SOCIAL HISTORY    reports that he quit smoking about 5 months ago. His smoking use included Cigarettes. He has a 5 pack-year smoking history. He has never used smokeless tobacco. He reports that he does not drink alcohol or use illicit drugs.  Review of Systems  Unable to perform ROS: critical illness      VITAL SIGNS    Temp:  [98.2 F (36.8 C)-99.3 F (37.4 C)] 99.2 F (37.3 C) (09/29 0800) Pulse Rate:  [35-117] 112 (09/29 0900) Resp:  [20-26] 26 (09/29 0900) BP: (89-147)/(48-95) 127/76 mmHg (09/29 0900) SpO2:  [85 %-99 %] 96 % (09/29 0900) FiO2 (%):  [60 %] 60 % (09/29 0714) Weight:  [364 lb 13.8 oz (165.5 kg)] 364 lb 13.8 oz (165.5 kg) (09/28  1415) HEMODYNAMICS:   VENTILATOR SETTINGS: Vent Mode:  [-] PRVC FiO2 (%):  [60 %] 60 % Set Rate:  [20 bmp-32 bmp] 26 bmp Vt Set:  [500 mL] 500 mL PEEP:  [10 cmH20-12 cmH20] 12 cmH20 INTAKE / OUTPUT:  Intake/Output Summary (Last 24 hours) at 05/23/15 0954 Last data filed at 05/23/15 0900  Gross per 24 hour  Intake 922.78 ml  Output   3577 ml  Net -2654.22 ml       PHYSICAL EXAM   Physical Exam  Constitutional: He appears well-developed. No distress.  Intubated   HENT:  Head: Normocephalic.  Eyes: Pupils are equal, round, and reactive to  light.  Does not track with eyes, but will open them, somewhat pinpoint pupils.   Neck: Normal range of motion.  Cardiovascular: Normal heart sounds and intact distal pulses.   Pulmonary/Chest: No respiratory distress. He has no wheezes. He has rales. He exhibits no tenderness.  Intubated  Coarse upper airway sounds.  Dec breath sounds at the bases.   Abdominal: Soft. He exhibits no distension. There is no tenderness.  Musculoskeletal: He exhibits edema.  Generalized edema  Neurological:  Unable to perform CN exam. GCS 12T Following commands this morning  Skin: Skin is dry.  Dry scaled lesions (psoriasis appearance) on right leg       LABS   LABS:  CBC  Recent Labs Lab 05/08/2015 1457 05/22/15 0450 05/23/15 0540  WBC 9.6 9.0 4.6  HGB 9.8* 9.8* 9.0*  HCT 30.6* 30.7* 28.0*  PLT 58* 52* 44*   Coag's  Recent Labs Lab 05/22/15 1000  APTT 29  INR 1.43   BMET  Recent Labs Lab 05/20/2015 0517 05/22/15 0450 05/23/15 0540  NA 137 139 140  K 4.8 4.4 4.1  CL 103 101 101  CO2 _0 BUN 78* 68* 70*  CREATININE 4.39* 3.87* 3.48*  GLUCOSE 136* 125* 144*   Electrolytes  Recent Labs Lab 05/19/15 1024  05/13/2015 0517 05/22/15 0450 05/23/15 0540  CALCIUM  --   < > 8.4* 8.2* 8.4*  MG  --   --  2.5*  --   --   PHOS 8.3*  --  7.4*  --   --   < > = values in this interval not displayed. Sepsis Markers No  results for input(s): LATICACIDVEN, PROCALCITON, O2SATVEN in the last 168 hours. ABG  Recent Labs Lab 05/22/15 1532 05/22/15 1715 05/23/15 0502  PHART 7.33* 7.36 7.38  PCO2ART 61* 58* 54*  PO2ART 62* 69* 81*   Liver Enzymes  Recent Labs Lab 05/05/2015 1402 05/23/15 0540  AST 14* 16  ALT 15* 13*  ALKPHOS 62 50  BILITOT 0.8 1.5*  ALBUMIN 3.0* 3.0*   Cardiac Enzymes  Recent Labs Lab 05/22/15 1000 05/22/15 1600 05/22/15 2252  TROPONINI 0.14* 0.11* 0.10*   Glucose  Recent Labs Lab 05/22/15 1147 05/22/15 1653 05/22/15 1953 05/23/15 0107 05/23/15 0413 05/23/15 0739  GLUCAP 114* 105* 144* 158* 147* 131*     Recent Results (from the past 240 hour(s))  Culture, blood (routine x 2)     Status: None   Collection Time: 05/09/2015  4:10 PM  Result Value Ref Range Status   Specimen Description BLOOD RIGHT AC  Final   Special Requests BOTTLES DRAWN AEROBIC AND ANAEROBIC  Bostic  Final   Culture NO GROWTH 5 DAYS  Final   Report Status 05/22/2015 FINAL  Final  Culture, blood (routine x 2)     Status: None   Collection Time: 04/30/2015  4:18 PM  Result Value Ref Range Status   Specimen Description BLOOD LEFT HAND  Final   Special Requests BOTTLES DRAWN AEROBIC AND ANAEROBIC  4CC  Final   Culture NO GROWTH 5 DAYS  Final   Report Status 05/22/2015 FINAL  Final  MRSA PCR Screening     Status: None   Collection Time: 05/20/15  1:53 PM  Result Value Ref Range Status   MRSA by PCR NEGATIVE NEGATIVE Final    Comment:        The GeneXpert MRSA Assay (FDA approved for NASAL specimens only), is one component of a comprehensive MRSA colonization  surveillance program. It is not intended to diagnose MRSA infection nor to guide or monitor treatment for MRSA infections.   Culture, bal-quantitative     Status: None (Preliminary result)   Collection Time: 04/29/2015  9:28 AM  Result Value Ref Range Status   Specimen Description BRONCHIAL ALVEOLAR LAVAGE  Final   Special Requests  Immunocompromised  Final   Gram Stain   Final    EXCELLENT SPECIMEN - 90-100% WBCS MANY WBC SEEN MANY GRAM POSITIVE COCCI IN PAIRS    Culture HOLDING FOR POSSIBLE PATHOGEN  Final   Report Status PENDING  Incomplete  Culture, blood (routine x 2)     Status: None (Preliminary result)   Collection Time: 05/13/2015  2:52 PM  Result Value Ref Range Status   Specimen Description BLOOD LEFT ASSIST CONTROL  Final   Special Requests BAA 8CC AER 8CC  Final   Culture NO GROWTH 2 DAYS  Final   Report Status PENDING  Incomplete  Culture, blood (routine x 2)     Status: None (Preliminary result)   Collection Time: 05/24/2015  3:44 PM  Result Value Ref Range Status   Specimen Description BLOOD LEFT ASSIST CONTROL  Final   Special Requests NONE  Final   Culture NO GROWTH 2 DAYS  Final   Report Status PENDING  Incomplete     Current facility-administered medications:  .  0.9 %  sodium chloride infusion, 100 mL, Intravenous, PRN, Munsoor Lateef, MD .  0.9 %  sodium chloride infusion, 100 mL, Intravenous, PRN, Munsoor Lateef, MD .  albuterol (PROVENTIL) (2.5 MG/3ML) 0.083% nebulizer solution 2.5 mg, 2.5 mg, Nebulization, Q6H PRN, Loletha Grayer, MD .  alteplase (CATHFLO ACTIVASE) injection 2 mg, 2 mg, Intracatheter, Once PRN, Munsoor Lateef, MD .  Margrett Rud amiodarone (NEXTERONE) 1.8 mg/mL load via infusion 150 mg, 150 mg, Intravenous, Once, 150 mg at 05/01/2015 1703 **FOLLOWED BY** [EXPIRED] amiodarone (NEXTERONE PREMIX) 360 MG/200ML (1.8 mg/mL) IV infusion, 60 mg/hr, Intravenous, Continuous, Stopped at 05/20/2015 2300 **FOLLOWED BY** amiodarone (NEXTERONE PREMIX) 360 MG/200ML (1.8 mg/mL) IV infusion, 30 mg/hr, Intravenous, Continuous, Demetrios Loll, MD, Last Rate: 16.7 mL/hr at 05/23/15 0800, 30 mg/hr at 05/23/15 0800 .  antiseptic oral rinse solution (CORINZ), 7 mL, Mouth Rinse, QID, Vishal Mungal, MD, 7 mL at 05/23/15 0430 .  aspirin EC tablet 81 mg, 81 mg, Oral, Daily, Loletha Grayer, MD, 81 mg at 05/18/15  1030 .  atorvastatin (LIPITOR) tablet 20 mg, 20 mg, Oral, QHS, Loletha Grayer, MD, 20 mg at 05/22/15 2114 .  carvedilol (COREG) tablet 12.5 mg, 12.5 mg, Oral, BID WC, Barnabas Harries, PA-C .  chlorhexidine gluconate (PERIDEX) 0.12 % solution 15 mL, 15 mL, Mouth Rinse, BID, Vishal Mungal, MD, 15 mL at 05/23/15 0825 .  clopidogrel (PLAVIX) tablet 75 mg, 75 mg, Oral, Daily, Loletha Grayer, MD, 75 mg at 05/22/15 1453 .  digoxin (LANOXIN) 0.25 MG/ML injection 0.25 mg, 0.25 mg, Intravenous, Daily, Barnabas Harries, PA-C, 0.25 mg at 05/22/15 1522 .  feeding supplement (PRO-STAT SUGAR FREE 64) liquid 30 mL, 30 mL, Oral, BID, Vishal Mungal, MD, 30 mL at 05/22/15 2116 .  feeding supplement (VITAL HIGH PROTEIN) liquid 1,000 mL, 1,000 mL, Per Tube, Q24H, Vishal Mungal, MD .  fentaNYL (SUBLIMAZE) bolus via infusion 50 mcg, 50 mcg, Intravenous, Q1H PRN, Vilinda Boehringer, MD, 50 mcg at 05/22/15 1735 .  fentaNYL 2552mg in NS 2524m(1033mml) infusion-PREMIX, 25-400 mcg/hr, Intravenous, Continuous, Vishal Mungal, MD, Last Rate: 25 mL/hr at 05/23/15 0608, 250 mcg/hr at  05/23/15 5449 .  fluticasone (FLONASE) 50 MCG/ACT nasal spray 2 spray, 2 spray, Each Nare, Daily, Demetrios Loll, MD, 2 spray at 05/22/15 1607 .  free water 100 mL, 100 mL, Per Tube, 3 times per day, Vishal Mungal, MD, 100 mL at 05/23/15 0600 .  furosemide (LASIX) injection 80 mg, 80 mg, Intravenous, BID, Loletha Grayer, MD, 80 mg at 05/23/15 0758 .  heparin injection 1,000 Units, 1,000 Units, Intracatheter, PRN, Munsoor Lateef, MD, 2,600 Units at 05/20/15 1234 .  heparin injection 5,000 Units, 5,000 Units, Subcutaneous, 3 times per day, Loletha Grayer, MD, 5,000 Units at 05/23/15 [phone] .  hydrALAZINE (APRESOLINE) injection 10 mg, 10 mg, Intravenous, Q6H PRN, Vishal Mungal, MD .  hydrALAZINE (APRESOLINE) tablet 25 mg, 25 mg, Oral, 3 times per day, Demetrios Loll, MD, 25 mg at 05/23/15 0605 .  HYDROcodone-acetaminophen (NORCO/VICODIN) 5-325 MG per tablet 1  tablet, 1 tablet, Oral, Q6H PRN, Loletha Grayer, MD, 1 tablet at 05/19/15 1953 .  HYDROmorphone (DILAUDID) injection 1 mg, 1 mg, Intravenous, BID, Vishal Mungal, MD, 1 mg at 05/22/15 2121 .  insulin aspart (novoLOG) injection 2-6 Units, 2-6 Units, Subcutaneous, 6 times per day, Elsie Stain, MD, 2 Units at 05/23/15 0745 .  ipratropium-albuterol (DUONEB) 0.5-2.5 (3) MG/3ML nebulizer solution 3 mL, 3 mL, Nebulization, Q4H, Elsie Stain, MD, 3 mL at 05/23/15 0713 .  lactulose (CHRONULAC) 10 GM/15ML solution 30 g, 30 g, Oral, TID, Vishal Mungal, MD, 30 g at 05/22/15 2114 .  lidocaine (PF) (XYLOCAINE) 1 % injection 5 mL, 5 mL, Intradermal, PRN, Munsoor Lateef, MD .  lidocaine-prilocaine (EMLA) cream 1 application, 1 application, Topical, PRN, Munsoor Lateef, MD .  methylPREDNISolone sodium succinate (SOLU-MEDROL) 125 mg/2 mL injection 60 mg, 60 mg, Intravenous, Daily, Demetrios Loll, MD, 60 mg at 05/22/15 1513 .  metoprolol (LOPRESSOR) injection 5 mg, 5 mg, Intravenous, Q6H PRN, Vishal Mungal, MD .  midazolam (VERSED) injection 2 mg, 2 mg, Intravenous, Q15 min PRN, Vishal Mungal, MD .  midazolam (VERSED) injection 2 mg, 2 mg, Intravenous, Q2H PRN, Vilinda Boehringer, MD, 2 mg at 05/23/15 0521 .  nitroGLYCERIN (NITROSTAT) SL tablet 0.4 mg, 0.4 mg, Sublingual, Q5 min PRN, Loletha Grayer, MD .  pantoprazole (PROTONIX) injection 40 mg, 40 mg, Intravenous, Q24H, Elsie Stain, MD, 40 mg at 05/22/15 2130 .  pentafluoroprop-tetrafluoroeth (GEBAUERS) aerosol 1 application, 1 application, Topical, PRN, Munsoor Lateef, MD .  piperacillin-tazobactam (ZOSYN) IVPB 3.375 g, 3.375 g, Intravenous, Q12H, Vishal Mungal, MD, 3.375 g at 05/22/15 2115 .  senna-docusate (Senokot-S) tablet 1 tablet, 1 tablet, Per Tube, BID, Vilinda Boehringer, MD, 1 tablet at 05/22/15 2114 .  vancomycin (VANCOCIN) IVPB 1000 mg/200 mL premix, 1,000 mg, Intravenous, Q dialysis, Vishal Mungal, MD, 1,000 mg at 05/22/15 1310  IMAGING    Dg Abd 1  View  05/22/2015   CLINICAL DATA:  NG tube placement.  EXAM: ABDOMEN - 1 VIEW  COMPARISON:  Yesterday at 1744 hr  FINDINGS: Tip of the enteric tube in the stomach, side-port likely be on the gastroesophageal junction though not well seen. Soft tissue attenuation from body habitus degrades evaluation. There is air throughout the colon in the included left upper quadrant.  IMPRESSION: Tip of the enteric tube in the stomach.   Electronically Signed   By: Jeb Levering M.D.   On: 05/22/2015 21:49   Dg Chest Port 1 View  05/23/2015   CLINICAL DATA:  Ventilator pneumonitis  EXAM: PORTABLE CHEST 1 VIEW  COMPARISON:  Yesterday  FINDINGS:  Endotracheal tube tip just below the clavicular heads. The gastric tube is not visualized below the lower chest, but evaluated on subsequent dedicated abdominal imaging. Right IJ central line, tip at the SVC level.  Improved upper lung visualization, likely from inferior flow pleural fluid. Pulmonary edema persists. Stable cardiomegaly and vascular pedicle widening. No air leak.  IMPRESSION: 1. Visible tubes and central line in unchanged position. 2. CHF. Better visualized upper lungs likely from inferior flow of pleural fluid.   Electronically Signed   By: Monte Fantasia M.D.   On: 05/23/2015 02:04      Indwelling Urinary Catheter continued, requirement due to   Reason to continue Indwelling Urinary Catheter for strict Intake/Output monitoring for hemodynamic instability   Central Line continued, requirement due to   Reason to continue [employer] Monitoring of central venous pressure or other hemodynamic parameters   Ventilator continued, requirement due to, resp failure    Ventilator Sedation RASS 0 to -2      ASSESSMENT/PLAN  54 yo male with CKD, dCHF, COPD on supplemental O2, now with acute respiratory failure on 9/26, intubated.   PULMONARY Acute respiratory failure Requirement for MV Mixed Resp and met acidosis Encephalopathy P:   - cont with  MV, wean as tolerated - WUA this morning, then wean vent parameters as tolerated. Possible PSV this afternoon - prn ABG - thick secretions noted during intubation>>bronch>>BAL>>thin secretions blood thin secretions - Bal, resp culture >>GPC  CARDIOVASCULAR dchf Hypertension Afib P:  - cont with intermittent HD - cont with fluid restriction and monitoring - cardiology following - plan for cardiac cath but patient exceeds the [facility] cath table weight limit, cardiology recommending transfer to another facility once respiratory status is more stable for cath.  - monitor BP - on amiodarone gtt.   RENAL Met acidosis - resolving CKD Oliguria Acute renal failure P:   -cont with HD - renal following -electrolyte replacement during dialysis - improving Cr.   GASTROINTESTINAL PPI prophylaxis OGT   INFECTIOUS Empiric abx with vanc and zosyn Follow up on current cultures.   P:   BCx2 - neg to date BCx2 9/27>> UC - neg BAL- GPC Abx: vanc/zosyn (renally dose), start date 9/27  LINES: 9/25>>R HD Fem cath  9/27>>RIJ CVL  ENDOCRINE ICU hypo/hyperglycemic protocol P:   -ssi  NEUROLOGIC A:   Encephalopathy - metabolic Lethargy - improved ?withdrawal - EtOH vs narcotic or both.  P:   - correct electrolytes - treat any underlying infection. - hold gabapentin - consider scheduling narcotic if agitation becomes worst.  -RASS goal: -1  SOCIAL  - I have spoken to the patient's father (HCPOA). Father stated that patient does not have any other family (no children, never married). Father updated on current clinical status.  Father stated that patient is a FULL CODE  Today's Summary: 54 yo m with dchf, copd, acute on chronic respiratory failure. Receiving hemodialysis, more alert and awake today, less agitated. Plan today is to continue with dialysis, possible wean vent and switch to PSV.   I have personally obtained a history, examined the patient, evaluated laboratory and  imaging results, formulated the assessment and plan and placed orders.  The Patient requires high complexity decision making for assessment and support, frequent evaluation and titration of therapies, application of advanced monitoring technologies and extensive interpretation of multiple databases. Critical Care Time devoted to patient care services described in this note is 45 minutes.   Overall, patient is critically ill, prognosis is guarded. Patient  at high risk for cardiac arrest and death.   Vilinda Boehringer, MD [facility] Pager [phone] (please enter 7-digits) On Call Pager - [phone] (please enter 7-digits)     05/23/2015, 9:54 AM

## 2015-05-23 NOTE — Progress Notes (Signed)
Post hd tx 

## 2015-05-23 NOTE — Progress Notes (Signed)
Pallaitive Care Update  NOTE  There is no known HCPOA, but there is rumor that there might have been at one time, though this would have been a friend of pt and family has not located any document stating that someone else is HCPOA. It is possible that someone could come up here with a document stating that he is HCPOA --but not thought likely to happen.   Father is primary contact and decision maker, but not HCPOA, as no such form exists to our knowledge.  Unless proven otherwise, pt's father is the person to contact and to make decisions when the patient is unable to make his own decisions (but he is not 'HCPOA').   The second person to contact is pts sister. I have added her phone number to the contact information.   Suan Halter MD

## 2015-05-23 NOTE — Progress Notes (Signed)
Pre-hd tx 

## 2015-05-23 NOTE — Progress Notes (Signed)
Pharmacy Consult for Vancomycin/Zosyn Indication: rule out sepsis  Allergies  Allergen Reactions  . Morphine And Related Other (See Comments)    Reaction:  Headaches     Patient Measurements: Height:  (170.2 cm) Weight: (!) 364 lb 13.8 oz (165.5 kg) IBW/kg (Calculated) : 66.1 Adjusted Body Weight: 109.3 kg  Vital Signs: Temp: 99.2 F (37.3 C) (09/29 0800) Temp Source: Oral (09/29 0800) BP: 157/98 mmHg (09/29 1005) Pulse Rate: 97 (09/29 1005) Intake/Output from previous day: 09/28 0701 - 09/29 0700 In: 845.5 [I.V.:416; NG/GT:429.5] Out: 3427 [Urine:1035] Intake/Output from this shift: Total I/O In: 280.1 [I.V.:50.1; NG/GT:230] Out: 500 [Urine:500]  Labs:  Recent Labs  05/05/2015 0517 05/09/2015 1457 05/22/15 0450 05/22/15 1553 05/23/15 0540  WBC 8.6 9.6 9.0  --  4.6  HGB 9.8* 9.8* 9.8*  --  9.0*  PLT 85* 58* 52*  --  44*  LABCREA  --   --   --  163  --   CREATININE 4.39*  --  3.87*  --  3.48*   Estimated Creatinine Clearance: 35.9 mL/min (by C-G formula based on Cr of 3.48). No results for input(s): VANCOTROUGH, VANCOPEAK, VANCORANDOM, GENTTROUGH, GENTPEAK, GENTRANDOM, TOBRATROUGH, TOBRAPEAK, TOBRARND, AMIKACINPEAK, AMIKACINTROU, AMIKACIN in the last 72 hours.   Microbiology: Recent Results (from the past 720 hour(s))  MRSA PCR Screening     Status: None   Collection Time: 05/01/15  9:42 PM  Result Value Ref Range Status   MRSA by PCR NEGATIVE NEGATIVE Final    Comment:        The GeneXpert MRSA Assay (FDA approved for NASAL specimens only), is one component of a comprehensive MRSA colonization surveillance program. It is not intended to diagnose MRSA infection nor to guide or monitor treatment for MRSA infections.   Culture, blood (routine x 2)     Status: None   Collection Time: 05/19/2015  4:10 PM  Result Value Ref Range Status   Specimen Description BLOOD RIGHT AC  Final   Special Requests BOTTLES DRAWN AEROBIC AND ANAEROBIC  9CC  Final   Culture NO GROWTH 5 DAYS  Final   Report Status 05/22/2015 FINAL  Final  Culture, blood (routine x 2)     Status: None   Collection Time: 05/19/2015  4:18 PM  Result Value Ref Range Status   Specimen Description BLOOD LEFT HAND  Final   Special Requests BOTTLES DRAWN AEROBIC AND ANAEROBIC  4CC  Final   Culture NO GROWTH 5 DAYS  Final   Report Status 05/22/2015 FINAL  Final  MRSA PCR Screening     Status: None   Collection Time: 05/20/15  1:53 PM  Result Value Ref Range Status   MRSA by PCR NEGATIVE NEGATIVE Final    Comment:        The GeneXpert MRSA Assay (FDA approved for NASAL specimens only), is one component of a comprehensive MRSA colonization surveillance program. It is not intended to diagnose MRSA infection nor to guide or monitor treatment for MRSA infections.   Culture, bal-quantitative     Status: None (Preliminary result)   Collection Time: 04/29/2015  9:28 AM  Result Value Ref Range Status   Specimen Description BRONCHIAL ALVEOLAR LAVAGE  Final   Special Requests Immunocompromised  Final   Gram Stain   Final    EXCELLENT SPECIMEN - 90-100% WBCS MANY WBC SEEN MANY GRAM POSITIVE COCCI IN PAIRS    Culture HOLDING FOR POSSIBLE PATHOGEN  Final   Report Status PENDING  Incomplete  Culture,  blood (routine x 2)     Status: None (Preliminary result)   Collection Time: 05/10/2015  2:52 PM  Result Value Ref Range Status   Specimen Description BLOOD LEFT ASSIST CONTROL  Final   Special Requests BAA 8CC AER 8CC  Final   Culture NO GROWTH 2 DAYS  Final   Report Status PENDING  Incomplete  Culture, blood (routine x 2)     Status: None (Preliminary result)   Collection Time: 04/27/2015  3:44 PM  Result Value Ref Range Status   Specimen Description BLOOD LEFT ASSIST CONTROL  Final   Special Requests NONE  Final   Culture NO GROWTH 2 DAYS  Final   Report Status PENDING  Incomplete    Medical History: Past Medical History  Diagnosis Date  . Hypertension   . COPD (chronic  obstructive pulmonary disease)   . Chronic kidney disease   . Chronic back pain greater than 3 months duration   . Phlebitis 1992    left calf  . Anxiety and depression   . CHF (congestive heart failure)   . Hepatitis B     Medications:  Scheduled:  . antiseptic oral rinse  7 mL Mouth Rinse QID  . aspirin EC  81 mg Oral Daily  . atorvastatin  20 mg Oral QHS  . carvedilol  12.5 mg Oral BID WC  . chlorhexidine gluconate  15 mL Mouth Rinse BID  . clopidogrel  75 mg Oral Daily  . digoxin  0.25 mg Intravenous Daily  . feeding supplement (PRO-STAT SUGAR FREE 64)  30 mL Oral BID  . feeding supplement (VITAL HIGH PROTEIN)  1,000 mL Per Tube Q24H  . fluticasone  2 spray Each Nare Daily  . free water  100 mL Per Tube 3 times per day  . furosemide  80 mg Intravenous BID  . hydrALAZINE  25 mg Oral 3 times per day  .  HYDROmorphone (DILAUDID) injection  1 mg Intravenous BID  . insulin aspart  2-6 Units Subcutaneous 6 times per day  . ipratropium-albuterol  3 mL Nebulization Q4H  . lactulose  30 g Oral TID  . methylPREDNISolone (SOLU-MEDROL) injection  60 mg Intravenous Daily  . pantoprazole (PROTONIX) IV  40 mg Intravenous Q24H  . piperacillin-tazobactam (ZOSYN)  IV  3.375 g Intravenous Q12H  . senna-docusate  1 tablet Per Tube BID   Infusions:  . amiodarone 30 mg/hr (05/23/15 0800)  . fentaNYL infusion INTRAVENOUS 250 mcg/hr (05/23/15 4098)   PRN: sodium chloride, sodium chloride, albuterol, alteplase, anticoagulant sodium citrate, fentaNYL, heparin, hydrALAZINE, HYDROcodone-acetaminophen, lidocaine (PF), lidocaine-prilocaine, metoprolol, midazolam, midazolam, nitroGLYCERIN, pentafluoroprop-tetrafluoroeth, vancomycin  Assessment: 54 y/o M with CKD requiring temporary HD this admission ordered empiric abx for presumed sepsis.   Goal of Therapy:  Pre-HD vancomycin level: 15-25, post-HD vancomycin level: 5-15  Plan:  Continue Zosyn 3.375 g EI q 12 hours.  Renal MD plans for HD today  so will need to schedule a vancomycin dose at the end of or after HD session. Per nephrology, patient may require daily dialysis for a period of time. Patient will receive the second dose of vancomycin with dialysis today. Will need to order trough prior to HD tomorrow if patient is to be dialyzed again. Will continue to follow plans for HD and culture results.  Gary Juarez D 05/23/2015,11:57 AM

## 2015-05-23 NOTE — Progress Notes (Signed)
Palliative Medicine Inpatient Consult Follow Up Note   Name: ABSALOM ARO Date: 05/23/2015 MRN: [id number]  DOB: 09/13/1960  Referring Physician: Shaune Pollack, MD  Palliative Care consult requested for this 54 y.o. male for goals of medical therapy in patient with acute on chronic respiratory failure. He was discharged from this hospital for acute on chronic respiratory failure He had home health visiting him in the home and the nurse advised him to go to the hospital on 04/25/2015 due to a gain of 10 lbs in 3 days and obvious dyspnea and chest tightness. During his most recent admission, he showed initial improvement in ER after Narcan.  He has had hemodialysis daily since Monday and will have it again today. Yesterday, they could not get much fluid off due to blood pressure running somewhat low. He is oliguric.  His vent settings have decreased but are not at the point where he can have trials off the vent.   IMPRESSION: Metabolic Encephalopathy thought due to CO2 narcosis (second admission for same in same month) Acute on chronic respiratory failure ---intubated and ventilated for several days now ---multifactorial Due to CHF, renal failure, obesity hypoventilation, OSA, and COPD COPD exacerbation  Acute on Chronic diastolic CHF ---recent echo showed nl LVF but LVH and diastolic dysfxn --to have outpt work up Acute on Chronic kidney failure (CKD stage IV since earlier this month --previously stage III)) Morbid Obesity Probable Sleep Apnea H/O Hepatitis B with liver disease  ---on lactulose Chronic Back Pain on chronic pain medications Essential HTN Anxiety and Depression H/O CAD Hyperkalemia Dyslipidemia H/O left leg phlebitis Thrombocytopenia --now worsening Anemia of unclear etiology Tobacco Smoker for 20 years (quit 12/13/14) Hyperglycemia   TODAY'S DISCUSSIONS AND DECISIONS:  1.  Pt remains FULL CODE for now. This is because I have not yet been able to discuss this  matter with the pt and he is starting to waken and should be the one to talk with about this.   2.  I have called the patient's sister, Zella Ball, by phone and will see her later today in person.  She was extremely helpful in providing information.  See social history below for information she gave me.  She states that her father, Issiah Huffaker, is in a degree of denial about Ricky's condition.  However, she says he also has gone to a funeral home and to a cemetery to work on funeral arrangements or to at least explore this matter.   3.  I was not able to talk in private with patient's father, Iantha Fallen, since he arrived today in the company of his brother-in-law, Felipa Evener. Zella Ball (pts sister) had specifically asked me to not talk openly with other family or friends and to keep things between [place] and the pts father and sister.  I was able to tell Roe Coombs (in the coffee room) that I did need to talk privately with Iantha Fallen at some point and Roe Coombs understood and said he would not be here all day.  I did mention some topics I might want to discuss at some point including code status when I spoke with the father, Iantha Fallen.   4.  Zella Ball informed me that Clide Cliff is gay but he had never come out publicly (instead he withdrew from family and others) --- and to keep this as private as possible. Since this is pertinent to pt's medical care, I let her know that I needed to make this information known to those caring for him but that we would  not talk about this with pts father (who is aware but in denial and who does not like to discuss this at all).  I am ordering an HIV screen and have discussed this with Dr. Dema Severin.  Additional Social History: Pts mother is deceased.  Pt is not married and has no children.  He has a father, Pleasant Bensinger (his numbers are in the contact information).  He has a sister, Ebony Cargo at cell # [phone].  She lives part time in [place] and part time in [place] She is staying with her father for a few days to visit Ricky.  She will come up here later today but has a hard time staying because Clide Cliff is still not in a mood to talk with her, though she is trying to reach out to him.  She tells him she loves him when she visits.  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 He prefers to be called his middle name, 'Harrison Mons', by others.  Pt smoked for at least 20 years and quit this past spring.  Clide Cliff has been estranged from his family (primarily from his sister) for many years. She has continued to try to invite him for Holidays, etc, but he has never come to these events.  He withdrew from them and went to live in a place with no heat and no electricity and no water, etc.   He is reported to be gay by his sister --but she wants [place] to not talk too much about this subject because her father is still having a very hard time with this subject matter.  Robin asked that we NOT TALK ABOUT THIS WITH ANYONE BUT HER.   Zella Ball reports that he has been self-destructive for years and it all probably relates to the issue of being gay --but she says he never gave his family the option of accepting him because he withdrew from them and from civilized life.    I see that 'Medicaid Potential' is listed under insurance on facesheet.    REVIEW OF SYSTEMS:  Patient is not able to provide ROS on the vent  CODE STATUS: Full code   PAST MEDICAL HISTORY: Past Medical History  Diagnosis Date  . Hypertension   . COPD (chronic obstructive pulmonary disease)   . Chronic kidney disease   . Chronic back pain greater than 3 months duration   . Phlebitis 1992    left calf  . Anxiety and depression   . CHF (congestive heart failure)   . Hepatitis B     PAST SURGICAL HISTORY:  Past Surgical History  Procedure Laterality Date  . Wisdom tooth extraction      Vital Signs: BP 134/69 mmHg  Pulse 98  Temp(Src) 99.2 F (37.3 C) (Oral)  Resp 24  Ht  (1.702 m)  Wt 165.5  kg (364 lb 13.8 oz)  BMI 57.13 kg/m2  SpO2 96% Filed Weights   05/22/15 0606 05/22/15 0938 05/22/15 1415  Weight: 169.6 kg (373 lb 14.4 oz) 169.6 kg (373 lb 14.4 oz) 165.5 kg (364 lb 13.8 oz)    Estimated body mass index is 57.13 kg/(m^2) as calculated from the following:   Height as of this encounter:  (1.702 m).   Weight as of this encounter: 165.5 kg (364 lb 13.8 oz).  PHYSICAL EXAM: Today, now, he is awake on vent His hands show tremors at times He is able to wave and nod --but its  not sure how much he is comprehending EOMI Neck w/o JVD Hrt irreg irreg ---rate controlled on tele Lungs cta with decreased bs in bases Abd soft and morbidly obese and nontender Ext no mottling or cynosis  LABS: CBC:    Component Value Date/Time   WBC 4.6 05/23/2015 0540   WBC 4.9 12/12/2014 2234   HGB 9.0* 05/23/2015 0540   HGB 11.2* 12/12/2014 2234   HCT 28.0* 05/23/2015 0540   HCT 34.9* 12/12/2014 2234   PLT 44* 05/23/2015 0540   PLT 94* 12/12/2014 2234   MCV 84.4 05/23/2015 0540   MCV 89 12/12/2014 2234   NEUTROABS 9.1* 05/01/2015 1651   NEUTROABS 3.0 12/12/2014 2234   LYMPHSABS 1.1 05/01/2015 1651   LYMPHSABS 0.7* 12/12/2014 2234   MONOABS 2.0* 05/01/2015 1651   MONOABS 1.0 12/12/2014 2234   EOSABS 0.2 05/01/2015 1651   EOSABS 0.2 12/12/2014 2234   BASOSABS 0.0 05/01/2015 1651   BASOSABS 0.0 12/12/2014 2234   Comprehensive Metabolic Panel:    Component Value Date/Time   NA 140 05/23/2015 0540   NA 140 12/12/2014 2234   K 4.1 05/23/2015 0540   K 4.5 12/12/2014 2234   CL 101 05/23/2015 0540   CL 100* 12/12/2014 2234   CO2 30 05/23/2015 0540   CO2 34* 12/12/2014 2234   BUN 70* 05/23/2015 0540   BUN 36* 12/12/2014 2234   CREATININE 3.48* 05/23/2015 0540   CREATININE 2.57* 12/12/2014 2234   GLUCOSE 144* 05/23/2015 0540   GLUCOSE 107* 12/12/2014 2234   CALCIUM 8.4* 05/23/2015 0540   CALCIUM 8.6* 12/12/2014 2234   AST 16 05/23/2015 0540   AST 31 12/12/2014 2234    ALT 13* 05/23/2015 0540   ALT 18 12/12/2014 2234   ALKPHOS 50 05/23/2015 0540   ALKPHOS 71 12/12/2014 2234   BILITOT 1.5* 05/23/2015 0540   BILITOT 1.3* 12/12/2014 2234   PROT 6.1* 05/23/2015 0540   PROT 6.5 12/12/2014 2234   ALBUMIN 3.0* 05/23/2015 0540   ALBUMIN 3.2* 12/12/2014 2234    More than 50% of the visit was spent in counseling/coordination of care: YES  Time Spent: 45 min

## 2015-05-23 NOTE — Progress Notes (Deleted)
Lab called critical potassium  (2.9) from 11:00 Basic Metabolic Panel, but potassium placement had been ordered during rounds and had not yet began running at that time. Potassium replacement currently running at this time.

## 2015-05-23 NOTE — Progress Notes (Signed)
HD tx completed.

## 2015-05-23 NOTE — Progress Notes (Signed)
Subjective:   Was started on temporary dialysis after failure of diuretic therapy 1st HD done on 9/25 (sunday) Patient remains critically ill Remains intubated and sedated with periods of agitation HR in 130's Continues to have significant edema  Objective:  Vital signs in last 24 hours:  Temp:  [98.2 F (36.8 C)-99.3 F (37.4 C)] 99.2 F (37.3 C) (09/29 0800) Pulse Rate:  [35-128] 98 (09/29 0800) Resp:  [20-26] 24 (09/29 0800) BP: (89-156)/(48-95) 134/69 mmHg (09/29 0800) SpO2:  [85 %-99 %] 96 % (09/29 0800) FiO2 (%):  [60 %] 60 % (09/29 0714) Weight:  [165.5 kg (364 lb 13.8 oz)-169.6 kg (373 lb 14.4 oz)] 165.5 kg (364 lb 13.8 oz) (09/28 1415)  Weight change: -7.6 kg (-16 lb 12.1 oz) Filed Weights   05/22/15 0606 05/22/15 0938 05/22/15 1415  Weight: 169.6 kg (373 lb 14.4 oz) 169.6 kg (373 lb 14.4 oz) 165.5 kg (364 lb 13.8 oz)    Intake/Output: I/O last 3 completed shifts: In: 1105.5 [I.V.:526; NG/GT:529.5; IV Piggyback:50] Out: 1610 [Urine:1785; Emesis/NG output:500; Other:2392]   Intake/Output this shift:  Total I/O In: 66.7 [I.V.:16.7; NG/GT:50] Out: 250 [Urine:250]  Physical Exam: General: morbidly obese NAD,  Critically ill appearing  Head: Normocephalic, atraumatic.    E/ENT: Anicteric, moist oral mucus membranes  Neck: Supple,    Lungs:  diffuse bilateral rales, vent assisted  Heart: S1S2 no rubs  Abdomen:  Soft, nontender, obese, lower abdominal wall edema noted  Extremities: 3+ b/l LE edema, rt fem temp cath  Neurologic: Alert, following simple commands, not agitated today  Skin: Red blanching rash over upper torso neck, shoulders       Basic Metabolic Panel:  Recent Labs Lab 05/19/15 1024 05/20/15 0428 05/20/15 1310 June 16, 2015 0517 05/22/15 0450 05/23/15 0540  NA  --  135 137 137 139 140  K  --  5.8* 4.7 4.8 4.4 4.1  CL  --  102 101 103 101 101  CO2  --  23 28 26 30 30   GLUCOSE  --  225* 164* 136* 125* 144*  BUN  --  89* 70* 78* 68* 70*   CREATININE  --  4.76* 3.80* 4.39* 3.87* 3.48*  CALCIUM  --  8.4* 8.2* 8.4* 8.2* 8.4*  MG  --   --   --  2.5*  --   --   PHOS 8.3*  --   --  7.4*  --   --     Liver Function Tests:  Recent Labs Lab 04/30/2015 1402 05/23/15 0540  AST 14* 16  ALT 15* 13*  ALKPHOS 62 50  BILITOT 0.8 1.5*  PROT 6.2* 6.1*  ALBUMIN 3.0* 3.0*   No results for input(s): LIPASE, AMYLASE in the last 168 hours.  Recent Labs Lab June 16, 2015 0517  AMMONIA 105*    CBC:  Recent Labs Lab 05/20/15 1224 06/16/2015 0517 June 16, 2015 1457 05/22/15 0450 05/23/15 0540  WBC 5.6 8.6 9.6 9.0 4.6  HGB 10.1* 9.8* 9.8* 9.8* 9.0*  HCT 31.4* 30.4* 30.6* 30.7* 28.0*  MCV 83.7 82.6 83.0 83.4 84.4  PLT 75* 85* 58* 52* 44*    Cardiac Enzymes:  Recent Labs Lab 05/16/2015 1402 05/22/15 1000 05/22/15 1600 05/22/15 2252  TROPONINI 0.03 0.14* 0.11* 0.10*    BNP: Invalid input(s): POCBNP  CBG:  Recent Labs Lab 05/22/15 1653 05/22/15 1953 05/23/15 0107 05/23/15 0413 05/23/15 0739  GLUCAP 105* 144* 158* 147* 131*    Microbiology: Results for orders placed or performed during the hospital encounter of  04/29/2015  Culture, blood (routine x 2)     Status: None   Collection Time: 05/15/2015  4:10 PM  Result Value Ref Range Status   Specimen Description BLOOD RIGHT AC  Final   Special Requests BOTTLES DRAWN AEROBIC AND ANAEROBIC  9CC  Final   Culture NO GROWTH 5 DAYS  Final   Report Status 05/22/2015 FINAL  Final  Culture, blood (routine x 2)     Status: None   Collection Time: 05/16/2015  4:18 PM  Result Value Ref Range Status   Specimen Description BLOOD LEFT HAND  Final   Special Requests BOTTLES DRAWN AEROBIC AND ANAEROBIC  4CC  Final   Culture NO GROWTH 5 DAYS  Final   Report Status 05/22/2015 FINAL  Final  MRSA PCR Screening     Status: None   Collection Time: 05/20/15  1:53 PM  Result Value Ref Range Status   MRSA by PCR NEGATIVE NEGATIVE Final    Comment:        The GeneXpert MRSA Assay  (FDA approved for NASAL specimens only), is one component of a comprehensive MRSA colonization surveillance program. It is not intended to diagnose MRSA infection nor to guide or monitor treatment for MRSA infections.   Culture, bal-quantitative     Status: None (Preliminary result)   Collection Time: June 03, 2015  9:28 AM  Result Value Ref Range Status   Specimen Description BRONCHIAL ALVEOLAR LAVAGE  Final   Special Requests Immunocompromised  Final   Gram Stain   Final    EXCELLENT SPECIMEN - 90-100% WBCS MANY WBC SEEN MANY GRAM POSITIVE COCCI IN PAIRS    Culture HOLDING FOR POSSIBLE PATHOGEN  Final   Report Status PENDING  Incomplete  Culture, blood (routine x 2)     Status: None (Preliminary result)   Collection Time: 06-03-15  2:52 PM  Result Value Ref Range Status   Specimen Description BLOOD LEFT ASSIST CONTROL  Final   Special Requests BAA 8CC AER 8CC  Final   Culture NO GROWTH < 24 HOURS  Final   Report Status PENDING  Incomplete  Culture, blood (routine x 2)     Status: None (Preliminary result)   Collection Time: Jun 03, 2015  3:44 PM  Result Value Ref Range Status   Specimen Description BLOOD LEFT ASSIST CONTROL  Final   Special Requests NONE  Final   Culture NO GROWTH < 24 HOURS  Final   Report Status PENDING  Incomplete    Coagulation Studies:  Recent Labs  05/22/15 1000  LABPROT 17.6*  INR 1.43    Urinalysis: No results for input(s): COLORURINE, LABSPEC, PHURINE, GLUCOSEU, HGBUR, BILIRUBINUR, KETONESUR, PROTEINUR, UROBILINOGEN, NITRITE, LEUKOCYTESUR in the last 72 hours.  Invalid input(s): APPERANCEUR    Imaging: Dg Abd 1 View  05/22/2015   CLINICAL DATA:  NG tube placement.  EXAM: ABDOMEN - 1 VIEW  COMPARISON:  Yesterday at 1744 hr  FINDINGS: Tip of the enteric tube in the stomach, side-port likely be on the gastroesophageal junction though not well seen. Soft tissue attenuation from body habitus degrades evaluation. There is air throughout the colon in  the included left upper quadrant.  IMPRESSION: Tip of the enteric tube in the stomach.   Electronically Signed   By: Rubye Oaks M.D.   On: 05/22/2015 21:49   Dg Abd 1 View  Jun 03, 2015   CLINICAL DATA:  Nasogastric tube placement  EXAM: ABDOMEN - 1 VIEW  COMPARISON:  Study obtained earlier in the day  FINDINGS: Nasogastric  to tip and side port are below the diaphragm in the region of the stomach. The bowel gas pattern appears unremarkable in the visualized regions. There is extensive lower lobe infiltrate bilaterally.  IMPRESSION: Nasogastric tube tip and side port in stomach. Visualized bowel gas pattern grossly normal.   Electronically Signed   By: Bretta Bang III M.D.   On: 05/16/2015 18:04   Dg Abd 1 View  05/18/2015   CLINICAL DATA:  OG tube placement.  EXAM: ABDOMEN - 1 VIEW  COMPARISON:  None.  FINDINGS: No orogastric tube is identified. There is no bowel dilatation to suggest obstruction. There is no evidence of pneumoperitoneum, portal venous gas or pneumatosis. There are no pathologic calcifications along the expected course of the ureters.The osseous structures are unremarkable.  IMPRESSION: No orogastric tube is visualized below the diaphragm.   Electronically Signed   By: Elige Ko   On: 05/24/2015 16:24   Dg Chest Port 1 View  05/23/2015   CLINICAL DATA:  Ventilator pneumonitis  EXAM: PORTABLE CHEST 1 VIEW  COMPARISON:  Yesterday  FINDINGS: Endotracheal tube tip just below the clavicular heads. The gastric tube is not visualized below the lower chest, but evaluated on subsequent dedicated abdominal imaging. Right IJ central line, tip at the SVC level.  Improved upper lung visualization, likely from inferior flow pleural fluid. Pulmonary edema persists. Stable cardiomegaly and vascular pedicle widening. No air leak.  IMPRESSION: 1. Visible tubes and central line in unchanged position. 2. CHF. Better visualized upper lungs likely from inferior flow of pleural fluid.    Electronically Signed   By: Marnee Spring M.D.   On: 05/23/2015 02:04   Dg Chest Port 1 View  05/19/2015   CLINICAL DATA:  Acute respiratory distress. Intubated. Line placement.  EXAM: PORTABLE CHEST 1 VIEW  COMPARISON:  Chest radiograph from earlier today.  FINDINGS: Endotracheal tube tip is 3.3 cm above the carina. Right internal jugular central venous catheter terminates in the right atrium near the cavoatrial junction. Enteric tube enters the lower thoracic esophagus, with the tip not definitely visualized on this radiograph. Stable cardiomediastinal silhouette with moderate cardiomegaly. No pneumothorax. Stable small bilateral pleural effusions. Severe patchy airspace opacities are noted in the parahilar and basilar lungs bilaterally, not appreciably changed.  IMPRESSION: 1. Well-positioned endotracheal tube and right internal jugular central venous catheter. No pneumothorax. 2. Enteric tube enters the lower thoracic esophagus, with the tip not definitely seen on this image. 3. Stable moderate cardiomegaly and severe patchy parahilar and basilar airspace opacities bilaterally, favor severe pulmonary edema due to congestive heart failure. 4. Stable small bilateral pleural effusions.   Electronically Signed   By: Delbert Phenix M.D.   On: 05/04/2015 16:31   Dg Chest Port 1 View  04/28/2015   CLINICAL DATA:  Status post intubation.  EXAM: PORTABLE CHEST 1 VIEW  COMPARISON:  May 20, 2015.  FINDINGS: Stable cardiomegaly with central pulmonary vascular congestion. Increased bilateral perihilar and basilar densities are noted concerning for worsening pulmonary edema. Endotracheal tube is in grossly good position with distal tip 5.6 cm above the carina. No pneumothorax is noted. Probable bilateral pleural effusions are noted. Bony thorax is unremarkable.  IMPRESSION: Endotracheal tube in grossly good position. Stable cardiomegaly is noted, with increased bilateral perihilar and basilar densities concerning  for worsening pulmonary edema and possible associated pleural effusions. These findings are concerning for congestive heart failure.   Electronically Signed   By: Lupita Raider, M.D.   On: 05/05/2015 08:50  Medications:   . amiodarone 30 mg/hr (05/23/15 0800)  . fentaNYL infusion INTRAVENOUS 250 mcg/hr (05/23/15 4098)   . antiseptic oral rinse  7 mL Mouth Rinse QID  . aspirin EC  81 mg Oral Daily  . atorvastatin  20 mg Oral QHS  . carvedilol  6.25 mg Oral BID WC  . chlorhexidine gluconate  15 mL Mouth Rinse BID  . clopidogrel  75 mg Oral Daily  . digoxin  0.25 mg Intravenous Daily  . feeding supplement (PRO-STAT SUGAR FREE 64)  30 mL Oral BID  . feeding supplement (VITAL HIGH PROTEIN)  1,000 mL Per Tube Q24H  . fluticasone  2 spray Each Nare Daily  . free water  100 mL Per Tube 3 times per day  . furosemide  80 mg Intravenous BID  . heparin  5,000 Units Subcutaneous 3 times per day  . hydrALAZINE  25 mg Oral 3 times per day  .  HYDROmorphone (DILAUDID) injection  1 mg Intravenous BID  . insulin aspart  2-6 Units Subcutaneous 6 times per day  . ipratropium-albuterol  3 mL Nebulization Q4H  . lactulose  30 g Oral TID  . methylPREDNISolone (SOLU-MEDROL) injection  60 mg Intravenous Daily  . pantoprazole (PROTONIX) IV  40 mg Intravenous Q24H  . piperacillin-tazobactam (ZOSYN)  IV  3.375 g Intravenous Q12H  . senna-docusate  1 tablet Per Tube BID   sodium chloride, sodium chloride, albuterol, alteplase, fentaNYL, heparin, hydrALAZINE, HYDROcodone-acetaminophen, lidocaine (PF), lidocaine-prilocaine, metoprolol, midazolam, midazolam, nitroGLYCERIN, pentafluoroprop-tetrafluoroeth, vancomycin  Assessment/ Plan:  Mr. JAISHON KRISHER is a 54 y.o.  white male with hepatits B, hypertension, COPD, anemia, thrombocytopenia, morbid obesity, tobacco abuse.  1. 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 Creatinine  baseline of 3.5 from 9/16.  Acute renal failure due to acute exacerbation of diastolic heart failure. - Renal function remains poor, pt also with  massive volume overload.   - started on HD. 1st treatment was on 9/25   - plan for another session of HD today for volume removal - hold gabapentin - may be causing somnolence. May restart after mental status normalized , use renally adjusted dose of 300 mg po qhs  2. Pulmonary edema and Acute diastolic heart failure:  Significant weight gain noted in a short period of time, lasix doesn't appear to be fully effective, will opt for HD for now.  3.  Massive generalized edema R60.1:  Significant edema noted,  Will plan for HD, may need daily HD for a bit. 2300 cc removed yesterday    LOS: 6 SINGH,HARMEET 9/29/20168:44 AM

## 2015-05-23 NOTE — Progress Notes (Signed)
HD tx start 

## 2015-05-23 NOTE — Progress Notes (Signed)
   SUBJECTIVE: Pt with eyes open, remains intubated.    Filed Vitals:   05/23/15 0600 05/23/15 0700 05/23/15 0749 05/23/15 0800  BP: 119/55 124/62 124/62 134/69  Pulse: 109 87 108 98  Temp:    99.2 F (37.3 C)  TempSrc:    Oral  Resp: Height:      Weight:      SpO2: 97% 97%  96%    Intake/Output Summary (Last 24 hours) at 05/23/15 0916 Last data filed at 05/23/15 0900  Gross per 24 hour  Intake 786.08 ml  Output   3577 ml  Net -2790.92 ml    LABS: Basic Metabolic Panel:  Recent Labs  16/10/96 0517 05/22/15 0450 05/23/15 0540  NA 137 139 140  K 4.8 4.4 4.1  CL 103 101 101  CO2 GLUCOSE 136* 125* 144*  BUN 78* 68* 70*  CREATININE 4.39* 3.87* 3.48*  CALCIUM 8.4* 8.2* 8.4*  MG 2.5*  --   --   PHOS 7.4*  --   --    Liver Function Tests:  Recent Labs  05/23/15 0540  AST 16  ALT 13*  ALKPHOS 50  BILITOT 1.5*  PROT 6.1*  ALBUMIN 3.0*   No results for input(s): LIPASE, AMYLASE in the last 72 hours. CBC:  Recent Labs  05/22/15 0450 05/23/15 0540  WBC 9.0 4.6  HGB 9.8* 9.0*  HCT 30.7* 28.0*  MCV 83.4 84.4  PLT 52* 44*   Cardiac Enzymes:  Recent Labs  05/22/15 1000 05/22/15 1600 05/22/15 2252  TROPONINI 0.14* 0.11* 0.10*   BNP: Invalid input(s): POCBNP D-Dimer: No results for input(s): DDIMER in the last 72 hours. Hemoglobin A1C:  Recent Labs  05/20/15 1224  HGBA1C 5.7   Fasting Lipid Panel: No results for input(s): CHOL, HDL, LDLCALC, TRIG, CHOLHDL, LDLDIRECT in the last 72 hours. Thyroid Function Tests: No results for input(s): TSH, T4TOTAL, T3FREE, THYROIDAB in the last 72 hours.  Invalid input(s): FREET3 Anemia Panel: No results for input(s): VITAMINB12, FOLATE, FERRITIN, TIBC, IRON, RETICCTPCT in the last 72 hours.   PHYSICAL EXAM General: Obese, disheveled  HEENT: normal Neck: supple. no JVD. Carotids 2+ bilat; no bruits. No lymphadenopathy or thryomegaly appreciated. Cor: PMI nondisplaced. IRIR. No  rubs, gallops or murmurs. Lungs: decreased breath sounds b/l. Mild wheezes  Abdomen: soft, nontender, nondistended. No hepatosplenomegaly. No bruits or masses. Good bowel sounds. Extremities: no cyanosis, clubbing, rash. 2+ pedal edema b/l Neuro: alert & oriented x 0, cranial nerves grossly intact. moves all 4 extremities w/o difficulty.  TELEMETRY: Reviewed telemetry pt in a-fib 120s VR  ASSESSMENT AND PLAN: acute on chronic diastolic dysfunction 2/2 fluid overload. Pt not intubated and sedated 2/2 respiratory failure. Planned for cardiac cath today, but pt weighs too much for [facility] cath table. Once pts respiratory status is stable, consider transfer to [facility] as their table has higher weight limit.   New onset a-fib: continue digoxin 0.25mg  IV, agree to amio gtt. Increase carvedilol to 12.5mg  BID this evening if BP will tolerate   Mildly elevated troponin: likely 2/2 demand ischemia and renal insufficiency.   Patient and plan discussed with supervising provider, Dr. Adrian Blackwater, who agrees with above findings.   Alinda Sierras Margarito Courser [facility]  05/23/2015 9:16 AM

## 2015-05-23 NOTE — Progress Notes (Signed)
Spoke with Dr. Dema Severin about patient's low platelet value discussed in rounds and plavix PO ordered. MD discontinued plavix at this time. RN had not given it yet today.

## 2015-05-23 NOTE — Progress Notes (Signed)
Nutrition Follow-up    INTERVENTION:   EN: recommend continuing current TF regimen, discussed lack of recent BM during ICU rounds   NUTRITION DIAGNOSIS:   Inadequate oral intake related to acute illness as evidenced by NPO status.  GOAL:   Provide needs based on ASPEN/SCCM guidelines  MONITOR:    (Energy Intake, Anthropometrics, Digestive System, Pulmonary, Electrolyte/Renal profile, Glucose Profile)  REASON FOR ASSESSMENT:   Consult, Ventilator Enteral/tube feeding initiation and management  ASSESSMENT:    Pt remains on vent, receiving daily dialysis, waking up and following commands this AM  Diet Order:  Diet NPO time specified  EN: tolerating Vital High Protein at goal rate of 60 ml/hr with Prostat TID  Skin:  Reviewed, no issues   Digestive System: no BM since 9/25 but passing flatus, no signs of TF intolerance, residuals 90-185 mL   Last BM:  9/25   Electrolyte and Renal Profile:  Recent Labs Lab 05/19/15 1024  2015/05/30 0517 05/22/15 0450 05/23/15 0540  BUN  --   < > 78* 68* 70*  CREATININE  --   < > 4.39* 3.87* 3.48*  NA  --   < > 137 139 140  K  --   < > 4.8 4.4 4.1  MG  --   --  2.5*  --   --   PHOS 8.3*  --  7.4*  --   --   < > = values in this interval not displayed.  Meds: lactulose, lasix, ss novolog, solumedrol, senokot  Height:   Ht Readings from Last 1 Encounters:  05/02/2015  (1.702 m)    Weight:   Wt Readings from Last 1 Encounters:  05/23/15 368 lb 2.7 oz (167 kg)   BMI:  Body mass index is 57.65 kg/(m^2).  Estimated Nutritional Needs:   Kcal:  7829-5621 kcals (22-25 kcals/kg IBW per ASPEN guidelines for BMI >50)  Protein:  134-168 g (2.0-2.5 g/kg)   Fluid:  1000 mL plus UOP  HIGH Care Level  Romelle Starcher MS, RD, LDN [phone] Pager

## 2015-05-23 NOTE — Progress Notes (Signed)
[facility] [place] [facility]   PATIENT NAME: Gary Juarez    MR#:  [id number]  DATE OF BIRTH:  1961/02/15  SUBJECTIVE:  CHIEF COMPLAINT:   Chief Complaint  Patient presents with  . Shortness of Breath  Opened eyes upon stimuli, on vent and amiodarone drip.  REVIEW OF SYSTEMS:  Unable to get a ROS.   DRUG ALLERGIES:   Allergies  Allergen Reactions  . Morphine And Related Other (See Comments)    Reaction:  Headaches     VITALS:  Blood pressure 134/69, pulse 98, temperature 99.2 F (37.3 C), temperature source Oral, resp. rate 24, height  (1.702 m), weight 165.5 kg (364 lb 13.8 oz), SpO2 96 %.  PHYSICAL EXAMINATION:  GENERAL:  54 y.o.-year-old patient lying in the bed with no acute distress. Morbidly obese. Critical ill-looking. EYES: Pupils equal, round, reactive to light and accommodation. No scleral icterus. Extraocular muscles intact.  HEENT: Head atraumatic, normocephalic. Oropharynx and nasopharynx clear. Moist oral mucosa. NECK:  Supple, no jugular venous distention. No thyroid enlargement, no tenderness.  LUNGS: Weak breath sounds bilaterally, no wheezing, mild bilateral crackles and rhonchi. Use of accessory muscles of respiration.  CARDIOVASCULAR: S1, S2 normal. No murmurs, rubs, or gallops.  ABDOMEN: Soft, nontender, nondistended. Bowel sounds present. Abdominal wall edema 1+. EXTREMITIES: Bilateral lower extremity edema 1-2+, no cyanosis, or clubbing.  NEUROLOGIC: on vent and sedation,  unable to exam. PSYCHIATRIC: on vent and sedation. SKIN: No obvious rash, lesion, or ulcer.    LABORATORY PANEL:   CBC  Recent Labs Lab 05/23/15 0540  WBC 4.6  HGB 9.0*  HCT 28.0*  PLT 44*   ------------------------------------------------------------------------------------------------------------------  Chemistries   Recent Labs Lab 04/29/2015 0517  05/23/15 0540  NA 137  < > 140  K 4.8  < > 4.1  CL 103  < > 101  CO2 26  <  > 30  GLUCOSE 136*  < > 144*  BUN 78*  < > 70*  CREATININE 4.39*  < > 3.48*  CALCIUM 8.4*  < > 8.4*  MG 2.5*  --   --   AST  --   --  16  ALT  --   --  13*  ALKPHOS  --   --  50  BILITOT  --   --  1.5*  < > = values in this interval not displayed. ------------------------------------------------------------------------------------------------------------------  Cardiac Enzymes  Recent Labs Lab 05/22/15 2252  TROPONINI 0.10*   ------------------------------------------------------------------------------------------------------------------  RADIOLOGY:  Dg Abd 1 View  05/22/2015   CLINICAL DATA:  NG tube placement.  EXAM: ABDOMEN - 1 VIEW  COMPARISON:  Yesterday at 1744 hr  FINDINGS: Tip of the enteric tube in the stomach, side-port likely be on the gastroesophageal junction though not well seen. Soft tissue attenuation from body habitus degrades evaluation. There is air throughout the colon in the included left upper quadrant.  IMPRESSION: Tip of the enteric tube in the stomach.   Electronically Signed   By: Rubye Oaks M.D.   On: 05/22/2015 21:49   Dg Abd 1 View  05/01/2015   CLINICAL DATA:  Nasogastric tube placement  EXAM: ABDOMEN - 1 VIEW  COMPARISON:  Study obtained earlier in the day  FINDINGS: Nasogastric to tip and side port are below the diaphragm in the region of the stomach. The bowel gas pattern appears unremarkable in the visualized regions. There is extensive lower lobe infiltrate bilaterally.  IMPRESSION: Nasogastric tube tip and side port in  stomach. Visualized bowel gas pattern grossly normal.   Electronically Signed   By: Bretta Bang III M.D.   On: 05/16/2015 18:04   Dg Abd 1 View  05/05/2015   CLINICAL DATA:  OG tube placement.  EXAM: ABDOMEN - 1 VIEW  COMPARISON:  None.  FINDINGS: No orogastric tube is identified. There is no bowel dilatation to suggest obstruction. There is no evidence of pneumoperitoneum, portal venous gas or pneumatosis. There are no  pathologic calcifications along the expected course of the ureters.The osseous structures are unremarkable.  IMPRESSION: No orogastric tube is visualized below the diaphragm.   Electronically Signed   By: Elige Ko   On: 05/06/2015 16:24   Dg Chest Port 1 View  05/23/2015   CLINICAL DATA:  Ventilator pneumonitis  EXAM: PORTABLE CHEST 1 VIEW  COMPARISON:  Yesterday  FINDINGS: Endotracheal tube tip just below the clavicular heads. The gastric tube is not visualized below the lower chest, but evaluated on subsequent dedicated abdominal imaging. Right IJ central line, tip at the SVC level.  Improved upper lung visualization, likely from inferior flow pleural fluid. Pulmonary edema persists. Stable cardiomegaly and vascular pedicle widening. No air leak.  IMPRESSION: 1. Visible tubes and central line in unchanged position. 2. CHF. Better visualized upper lungs likely from inferior flow of pleural fluid.   Electronically Signed   By: Marnee Spring M.D.   On: 05/23/2015 02:04   Dg Chest Port 1 View  04/26/2015   CLINICAL DATA:  Acute respiratory distress. Intubated. Line placement.  EXAM: PORTABLE CHEST 1 VIEW  COMPARISON:  Chest radiograph from earlier today.  FINDINGS: Endotracheal tube tip is 3.3 cm above the carina. Right internal jugular central venous catheter terminates in the right atrium near the cavoatrial junction. Enteric tube enters the lower thoracic esophagus, with the tip not definitely visualized on this radiograph. Stable cardiomediastinal silhouette with moderate cardiomegaly. No pneumothorax. Stable small bilateral pleural effusions. Severe patchy airspace opacities are noted in the parahilar and basilar lungs bilaterally, not appreciably changed.  IMPRESSION: 1. Well-positioned endotracheal tube and right internal jugular central venous catheter. No pneumothorax. 2. Enteric tube enters the lower thoracic esophagus, with the tip not definitely seen on this image. 3. Stable moderate  cardiomegaly and severe patchy parahilar and basilar airspace opacities bilaterally, favor severe pulmonary edema due to congestive heart failure. 4. Stable small bilateral pleural effusions.   Electronically Signed   By: Delbert Phenix M.D.   On: 04/25/2015 16:31   Dg Chest Port 1 View  05/13/2015   CLINICAL DATA:  Status post intubation.  EXAM: PORTABLE CHEST 1 VIEW  COMPARISON:  May 20, 2015.  FINDINGS: Stable cardiomegaly with central pulmonary vascular congestion. Increased bilateral perihilar and basilar densities are noted concerning for worsening pulmonary edema. Endotracheal tube is in grossly good position with distal tip 5.6 cm above the carina. No pneumothorax is noted. Probable bilateral pleural effusions are noted. Bony thorax is unremarkable.  IMPRESSION: Endotracheal tube in grossly good position. Stable cardiomegaly is noted, with increased bilateral perihilar and basilar densities concerning for worsening pulmonary edema and possible associated pleural effusions. These findings are concerning for congestive heart failure.   Electronically Signed   By: Lupita Raider, M.D.   On: 05/15/2015 08:50    EKG:   Orders placed or performed during the hospital encounter of 05/25/15  . ED EKG  . ED EKG  . EKG 12-Lead  . EKG 12-Lead  . EKG 12-Lead  . EKG 12-Lead  ASSESSMENT AND PLAN:   1. Acute on chronic respiratory failure with hypoxia and hypercapenia.  Try to wean off vent, f/u pulmonary physician.  2. COPD exacerbation. Continue IV Solu-Medrol, vancomycin and zosyn,  budesonide nebulizer, Spiriva and DuoNeb nebulizers.  3. Acute on chronic diastolic congestive heart failure. Continue hemodialysis today, continue Lasix 80 mg IV twice a day.  Coreg was on hold secondary to bradycardia. Bradycardia resolved.  hydralazine and norvasc were on hold secondary to relative hypotension. Blood pressure is high, resumed hydralazine and norvasc. Resumed coreg.  * New onset Afib.  Continue amiodarone drip and digoxin, Coreg is increased to 6.25 mg bid, f/u  Dr. Welton Flakes.  4. 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 Started hemodialysis 4 days ago and continue hemodialysis per Dr. Thedore Mins.  5. Morbid obesity and likely sleep apnea 6. History of hepatitis B and liver disease- on lactulose 7. Chronic back pain on chronic pain medications.  Hyperkalemia. Hold spironolactone, improved after hemodialysis. Follow up BMP.  Chronic thrombocytopenia. Stable.   Altered mental status due to acute metabolic encephalopathy,  due to respiratory failure/renal failure/med. On sedation. Hold gabapentin. Fall and aspiration precaution.  I discussed with Dr. Thedore Mins and Dr. Dema Severin. All the records are reviewed and case discussed with Care Management/Social Workerr. I discussed with the patient's father about the patient's critical condition and Management plans, and he is in agreement.  CODE STATUS: Full code  TOTAL CRITICAL TIME TAKING CARE OF THIS PATIENT: 43 minutes.   POSSIBLE D/C IN >3 DAYS, DEPENDING ON CLINICAL CONDITION.   Shaune Pollack M.D on 05/23/2015 at 8:21 AM  Between 7am to 6pm - Pager - [phone]  After 6pm go to [email or web address] - password [email or web address] [facility]  [facility]  Office  [phone]  CC: Primary care physician; Verlee Monte, PA-C

## 2015-05-23 NOTE — Care Management Note (Signed)
Case Management Note  Patient Details  Name: Gary Juarez MRN: [id number] Date of Birth: 1960/09/18  Subjective/Objective:  Admitted with acute on chronic respiratory failure, COPD exacerbation and acute CHF.  HX hepatitis B. New HD x 5 days now. Spoke with Ivor Reining. She states she will contact financial resource department to initiated emergency medicaid application. It is anitcipated patient will be a chronic dialysis patient. CSW consult initiated by nursing secondary to multiple social issues.  There has been some concern expressed that patient lived at home alone with no heat or living essentials. It is thought he has been fairly estranged from his family. I do not know more at this time. Plan is to wean vent today with possible extubation tomorrow. There is some question as to if patient may benefit from SNF at discharge.                 Action/Plan:   Expected Discharge Date:                  Expected Discharge Plan:     In-House Referral:     Discharge planning Services     Post Acute Care Choice:    Choice offered to:     DME Arranged:    DME Agency:     HH Arranged:    HH Agency:     Status of Service:     Medicare Important Message Given:    Date Medicare IM Given:    Medicare IM give by:    Date Additional Medicare IM Given:    Additional Medicare Important Message give by:     If discussed at Long Length of Stay Meetings, dates discussed:    Additional Comments:  Marily Memos, RN 05/23/2015, 10:58 AM

## 2015-05-24 ENCOUNTER — Inpatient Hospital Stay: Payer: Self-pay

## 2015-05-24 DIAGNOSIS — I130 Hypertensive heart and chronic kidney disease with heart failure and stage 1 through stage 4 chronic kidney disease, or unspecified chronic kidney disease: Secondary | ICD-10-CM

## 2015-05-24 DIAGNOSIS — D649 Anemia, unspecified: Secondary | ICD-10-CM

## 2015-05-24 DIAGNOSIS — R451 Restlessness and agitation: Secondary | ICD-10-CM

## 2015-05-24 DIAGNOSIS — I5033 Acute on chronic diastolic (congestive) heart failure: Principal | ICD-10-CM

## 2015-05-24 DIAGNOSIS — R739 Hyperglycemia, unspecified: Secondary | ICD-10-CM

## 2015-05-24 DIAGNOSIS — Z515 Encounter for palliative care: Secondary | ICD-10-CM

## 2015-05-24 LAB — GLUCOSE, CAPILLARY
GLUCOSE-CAPILLARY: 154 mg/dL — AB (ref 65–99)
GLUCOSE-CAPILLARY: 154 mg/dL — AB (ref 65–99)
Glucose-Capillary: 131 mg/dL — ABNORMAL HIGH (ref 65–99)
Glucose-Capillary: 150 mg/dL — ABNORMAL HIGH (ref 65–99)
Glucose-Capillary: 158 mg/dL — ABNORMAL HIGH (ref 65–99)
Glucose-Capillary: 167 mg/dL — ABNORMAL HIGH (ref 65–99)
Glucose-Capillary: 188 mg/dL — ABNORMAL HIGH (ref 65–99)

## 2015-05-24 LAB — HEPATITIS C ANTIBODY (REFLEX): HCV Ab: <0.1 s/co ratio (ref 0.0–0.9)

## 2015-05-24 LAB — HIV ANTIBODY (ROUTINE TESTING W REFLEX): HIV SCREEN 4TH GENERATION: NONREACTIVE

## 2015-05-24 LAB — HCV COMMENT:

## 2015-05-24 MED ORDER — HYDROMORPHONE HCL 2 MG PO TABS
2.0000 mg | ORAL_TABLET | Freq: Four times a day (QID) | ORAL | Status: DC | PRN
  Administered 2015-05-24 – 2015-05-26 (×6): 2 mg via ORAL
  Filled 2015-05-24 (×7): qty 1

## 2015-05-24 MED ORDER — KETOROLAC TROMETHAMINE 30 MG/ML IJ SOLN
30.0000 mg | Freq: Four times a day (QID) | INTRAMUSCULAR | Status: DC | PRN
  Administered 2015-05-24 – 2015-05-26 (×3): 30 mg via INTRAVENOUS
  Filled 2015-05-24 (×4): qty 1

## 2015-05-24 MED ORDER — HYDROMORPHONE HCL 2 MG PO TABS: 1.0000 mg | ORAL_TABLET | ORAL | Status: DC | PRN

## 2015-05-24 MED ORDER — HYDROMORPHONE HCL 2 MG PO TABS: 1.0000 mg | ORAL_TABLET | Freq: Four times a day (QID) | ORAL | Status: DC | PRN

## 2015-05-24 MED ORDER — HYDROCODONE-ACETAMINOPHEN 5-325 MG PO TABS: 1.0000 | ORAL_TABLET | Freq: Four times a day (QID) | ORAL | Status: DC | PRN

## 2015-05-24 NOTE — Consult Note (Signed)
PULMONARY / CRITICAL CARE MEDICINE   Name: Gary Juarez MRN: [id number] DOB: 07-24-1961    ADMISSION DATE:  05/16/2015 CONSULTATION DATE: 05/20/15  REFERRING MD :  Dr. Verdell Carmine   CHIEF COMPLAINT:     Short of breath.    HISTORY OF PRESENT ILLNESS  Patient unable to provide history due to lethargy and respiratory distress.  History per chart review and speaking with father  05/10/2015 54 y.o. male with a known history of diastolic congestive heart failure, COPD and stage on oxygen, hepatitis B, chronic kidney disease stage IV, hypertension, presented with shortness of breath and weight gain on 04/26/2015. Per chart review he gained 10 pounds in 3 days. His nurse stopped visited him on 05/11/2015 and referred him into the hospital for heavy breathing. At the time of admission he felt tightness in his abdomen and also chest tightness. Not much cough. He does feel nauseous. Admitting physician stated per chart review that "talking with him he was shaking and very cold." Diuresis was attempted but unsuccessful, an HD cath was placed on 9/24.  On 9/25 temp HD was attempted but patient was noted to be agitated, he was given 62m IV ativan. Throughout the night, he was noted to be more lethargic and unresponsive, overnight MD, gave flumazenil, with very little improvement.  This morning, he was noted to be on bipap, with no alertness, heaving breathing, and in moderate respiratory distress.  His GCS <8    SUBJECTIVE: Doing well this morning, calm, extubated to NRB, no acute complaints.   SIGNIFICANT EVENTS  9/24>>R fem HD cath placed by Surgery for temp HD.  9/27>>intubated, respiratory distress, anterior cords, thick secretions, intubated by anesthesia 9/27>>RIJ CVL, Bronch with BAL (LUL)>>thin secretions blood thin secretions, no endobronchial lesions.  9/30>>Extubated.      PAST MEDICAL HISTORY    :  Past Medical History  Diagnosis Date  . Hypertension   . COPD (chronic obstructive  pulmonary disease)   . Chronic kidney disease   . Chronic back pain greater than 3 months duration   . Phlebitis 1992    left calf  . Anxiety and depression   . CHF (congestive heart failure)   . Hepatitis B    Past Surgical History  Procedure Laterality Date  . Wisdom tooth extraction     Prior to Admission medications   Medication Sig Start Date End Date Taking? Authorizing Provider  albuterol (PROVENTIL HFA;VENTOLIN HFA) 108 (90 BASE) MCG/ACT inhaler Inhale 2 puffs into the lungs every 6 (six) hours as needed for wheezing or shortness of breath.   Yes Historical Provider, MD  amLODipine (NORVASC) 10 MG tablet Take 1 tablet (10 mg total) by mouth daily. 05/05/15  Yes RGladstone Lighter MD  aspirin EC 81 MG tablet Take 81 mg by mouth daily.   Yes Historical Provider, MD  atorvastatin (LIPITOR) 20 MG tablet Take 20 mg by mouth at bedtime.    Yes Historical Provider, MD  carvedilol (COREG) 12.5 MG tablet Take 12.5 mg by mouth 2 (two) times daily.   Yes Historical Provider, MD  clopidogrel (PLAVIX) 75 MG tablet Take 75 mg by mouth daily.   Yes Historical Provider, MD  Fluticasone-Salmeterol (ADVAIR DISKUS) 250-50 MCG/DOSE AEPB Inhale 1 puff into the lungs 2 (two) times daily. 05/05/15  Yes RGladstone Lighter MD  gabapentin (NEURONTIN) 300 MG capsule Take 300 mg by mouth 3 (three) times daily.   Yes Historical Provider, MD  hydrALAZINE (APRESOLINE) 25 MG tablet Take 1 tablet (25 mg  total) by mouth every 8 (eight) hours. 05/05/15  Yes Gladstone Lighter, MD  HYDROcodone-acetaminophen (NORCO/VICODIN) 5-325 MG per tablet Take 1 tablet by mouth every 6 (six) hours as needed for moderate pain. 05/05/15  Yes Gladstone Lighter, MD  isosorbide mononitrate (IMDUR) 30 MG 24 hr tablet Take 30 mg by mouth daily.   Yes Historical Provider, MD  lactulose (CHRONULAC) 10 GM/15ML solution Take 45 mLs (30 g total) by mouth 2 (two) times daily. 05/05/15  Yes Gladstone Lighter, MD  nitroGLYCERIN (NITROSTAT) 0.4 MG SL  tablet Place 1 tablet (0.4 mg total) under the tongue every 5 (five) minutes as needed for chest pain. 05/13/15  Yes Alisa Graff, FNP  pantoprazole (PROTONIX) 40 MG tablet Take 40 mg by mouth at bedtime.    Yes Historical Provider, MD  potassium chloride SA (K-DUR,KLOR-CON) 20 MEQ tablet Take 1 tablet (20 mEq total) by mouth 2 (two) times daily. 05/13/15  Yes Alisa Graff, FNP  spironolactone (ALDACTONE) 25 MG tablet Take 25 mg by mouth daily.   Yes Historical Provider, MD  tiotropium (SPIRIVA) 18 MCG inhalation capsule Place 18 mcg into inhaler and inhale daily.   Yes Historical Provider, MD  torsemide (DEMADEX) 20 MG tablet Take 2 tablets (40 mg total) by mouth 2 (two) times daily. 05/13/15  Yes Alisa Graff, FNP  predniSONE (STERAPRED UNI-PAK 21 TAB) 10 MG (21) TBPK tablet Take 1 tablet (10 mg total) by mouth daily. 6 tabs PO x 1 day 5 tabs PO x 1 day 4 tabs PO x 1 day 3 tabs PO x 1 day 2 tabs PO x 1 day 1 tab PO x 1 day and stop Patient not taking: Reported on 05/06/2015 05/05/15   Gladstone Lighter, MD   Allergies  Allergen Reactions  . Morphine And Related Other (See Comments)    Reaction:  Headaches      FAMILY HISTORY   Family History  Problem Relation Age of Onset  . Stroke Mother   . Cancer Mother     Breast      SOCIAL HISTORY    reports that he quit smoking about 5 months ago. His smoking use included Cigarettes. He has a 5 pack-year smoking history. He has never used smokeless tobacco. He reports that he does not drink alcohol or use illicit drugs.  Review of Systems  Unable to perform ROS: intubated      VITAL SIGNS    Temp:  [98.7 F (37.1 C)-99.2 F (37.3 C)] 98.7 F (37.1 C) (09/30 0800) Pulse Rate:  [65-106] 88 (09/30 0900) Resp:  [12-27] 16 (09/30 0900) BP: (113-169)/(53-94) 166/76 mmHg (09/30 0900) SpO2:  [87 %-100 %] 94 % (09/30 1104) FiO2 (%):  [35 %-50 %] 50 % (09/30 1104) Weight:  [364 lb 3.2 oz (165.2 kg)-368 lb 2.7 oz (167 kg)] 365 lb  4.8 oz (165.7 kg) (09/30 0434) HEMODYNAMICS:   VENTILATOR SETTINGS: Vent Mode:  [-] PSV FiO2 (%):  [35 %-50 %] 50 % Set Rate:  [26 bmp] 26 bmp Vt Set:  [500 mL] 500 mL PEEP:  [5 cmH20-12 cmH20] 5 cmH20 Pressure Support:  [5 cmH20] 5 cmH20 INTAKE / OUTPUT:  Intake/Output Summary (Last 24 hours) at 05/24/15 1127 Last data filed at 05/24/15 0900  Gross per 24 hour  Intake 2709.12 ml  Output   4765 ml  Net -2055.88 ml       PHYSICAL EXAM   Physical Exam  Constitutional: He appears well-developed. No distress.  Intubated  HENT:  Head: Normocephalic.  Eyes: Pupils are equal, round, and reactive to light.  Does not track with eyes, but will open them, somewhat pinpoint pupils.   Neck: Normal range of motion.  Cardiovascular: Normal heart sounds and intact distal pulses.   Pulmonary/Chest: No respiratory distress. He has no wheezes. He has no rales. He exhibits no tenderness.  Tolerating PS Post Extubation - on NRB, CTAB, dec BS bilaterally  Abdominal: Soft. He exhibits no distension. There is no tenderness.  Musculoskeletal: He exhibits edema.  Generalized edema  Neurological:  Awake and alert, mild disorientation post extubation.   Skin: Skin is dry.  Dry scaled lesions (psoriasis appearance) on right leg       LABS   LABS:  CBC  Recent Labs Lab 05/07/2015 1457 05/22/15 0450 05/23/15 0540  WBC 9.6 9.0 4.6  HGB 9.8* 9.8* 9.0*  HCT 30.6* 30.7* 28.0*  PLT 58* 52* 44*   Coag's  Recent Labs Lab 05/22/15 1000  APTT 29  INR 1.43   BMET  Recent Labs Lab 05/03/2015 0517 05/22/15 0450 05/23/15 0540  NA 137 139 140  K 4.8 4.4 4.1  CL 103 101 101  CO2 _0 BUN 78* 68* 70*  CREATININE 4.39* 3.87* 3.48*  GLUCOSE 136* 125* 144*   Electrolytes  Recent Labs Lab 05/19/15 1024  05/15/2015 0517 05/22/15 0450 05/23/15 0540  CALCIUM  --   < > 8.4* 8.2* 8.4*  MG  --   --  2.5*  --   --   PHOS 8.3*  --  7.4*  --   --   < > = values in this  interval not displayed. Sepsis Markers No results for input(s): LATICACIDVEN, PROCALCITON, O2SATVEN in the last 168 hours. ABG  Recent Labs Lab 05/22/15 1532 05/22/15 1715 05/23/15 0502  PHART 7.33* 7.36 7.38  PCO2ART 61* 58* 54*  PO2ART 62* 69* 81*   Liver Enzymes  Recent Labs Lab 05/20/2015 1402 05/23/15 0540  AST 14* 16  ALT 15* 13*  ALKPHOS 62 50  BILITOT 0.8 1.5*  ALBUMIN 3.0* 3.0*   Cardiac Enzymes  Recent Labs Lab 05/22/15 1000 05/22/15 1600 05/22/15 2252  TROPONINI 0.14* 0.11* 0.10*   Glucose  Recent Labs Lab 05/23/15 1130 05/23/15 1628 05/23/15 1958 05/23/15 2325 05/24/15 0411 05/24/15 0734  GLUCAP 130* 161* 158* 154* 154* 150*     Recent Results (from the past 240 hour(s))  Culture, blood (routine x 2)     Status: None   Collection Time: 05/12/2015  4:10 PM  Result Value Ref Range Status   Specimen Description BLOOD RIGHT AC  Final   Special Requests BOTTLES DRAWN AEROBIC AND ANAEROBIC  [place]  Final   Culture NO GROWTH 5 DAYS  Final   Report Status 05/22/2015 FINAL  Final  Culture, blood (routine x 2)     Status: None   Collection Time: 05/10/2015  4:18 PM  Result Value Ref Range Status   Specimen Description BLOOD LEFT HAND  Final   Special Requests BOTTLES DRAWN AEROBIC AND ANAEROBIC  4CC  Final   Culture NO GROWTH 5 DAYS  Final   Report Status 05/22/2015 FINAL  Final  MRSA PCR Screening     Status: None   Collection Time: 05/20/15  1:53 PM  Result Value Ref Range Status   MRSA by PCR NEGATIVE NEGATIVE Final    Comment:        The GeneXpert MRSA Assay (FDA approved for NASAL specimens only),  is one component of a comprehensive MRSA colonization surveillance program. It is not intended to diagnose MRSA infection nor to guide or monitor treatment for MRSA infections.   Culture, bal-quantitative     Status: None (Preliminary result)   Collection Time: 05/10/2015  9:28 AM  Result Value Ref Range Status   Specimen Description BRONCHIAL  ALVEOLAR LAVAGE  Final   Special Requests Immunocompromised  Final   Gram Stain   Final    EXCELLENT SPECIMEN - 90-100% WBCS MANY WBC SEEN MANY GRAM POSITIVE COCCI IN PAIRS    Culture   Final    HEAVY GROWTH STREPTOCOCCUS PNEUMONIAE SUSCEPTIBILITIES TO FOLLOW    Report Status PENDING  Incomplete  Culture, blood (routine x 2)     Status: None (Preliminary result)   Collection Time: 05/14/2015  2:52 PM  Result Value Ref Range Status   Specimen Description BLOOD LEFT ASSIST CONTROL  Final   Special Requests BAA 8CC AER 8CC  Final   Culture NO GROWTH 2 DAYS  Final   Report Status PENDING  Incomplete  Culture, blood (routine x 2)     Status: None (Preliminary result)   Collection Time: 05/12/2015  3:44 PM  Result Value Ref Range Status   Specimen Description BLOOD LEFT ASSIST CONTROL  Final   Special Requests NONE  Final   Culture NO GROWTH 2 DAYS  Final   Report Status PENDING  Incomplete     Current facility-administered medications:  .  0.9 %  sodium chloride infusion, 100 mL, Intravenous, PRN, Munsoor Lateef, MD .  0.9 %  sodium chloride infusion, 100 mL, Intravenous, PRN, Munsoor Lateef, MD .  albuterol (PROVENTIL) (2.5 MG/3ML) 0.083% nebulizer solution 2.5 mg, 2.5 mg, Nebulization, Q6H PRN, Loletha Grayer, MD .  alteplase (CATHFLO ACTIVASE) injection 2 mg, 2 mg, Intracatheter, Once PRN, Munsoor Lateef, MD .  Margrett Rud amiodarone (NEXTERONE) 1.8 mg/mL load via infusion 150 mg, 150 mg, Intravenous, Once, 150 mg at 05/18/2015 1703 **FOLLOWED BY** [EXPIRED] amiodarone (NEXTERONE PREMIX) 360 MG/200ML (1.8 mg/mL) IV infusion, 60 mg/hr, Intravenous, Continuous, Stopped at 04/29/2015 2300 **FOLLOWED BY** amiodarone (NEXTERONE PREMIX) 360 MG/200ML (1.8 mg/mL) IV infusion, 30 mg/hr, Intravenous, Continuous, Demetrios Loll, MD, Last Rate: 16.7 mL/hr at 05/24/15 1021, 30 mg/hr at 05/24/15 1021 .  anticoagulant sodium citrate solution 5 mL, 5 mL, Intravenous, Daily PRN, Murlean Iba, MD .  antiseptic  oral rinse solution (CORINZ), 7 mL, Mouth Rinse, QID, Vishal Mungal, MD, 7 mL at 05/24/15 0400 .  aspirin EC tablet 81 mg, 81 mg, Oral, Daily, Loletha Grayer, MD, 81 mg at 05/24/15 1006 .  atorvastatin (LIPITOR) tablet 20 mg, 20 mg, Oral, QHS, Loletha Grayer, MD, 20 mg at 05/23/15 2156 .  carvedilol (COREG) tablet 12.5 mg, 12.5 mg, Oral, BID WC, Barnabas Harries, PA-C, 12.5 mg at 05/24/15 1308 .  chlorhexidine gluconate (PERIDEX) 0.12 % solution 15 mL, 15 mL, Mouth Rinse, BID, Vishal Mungal, MD, 15 mL at 05/24/15 0830 .  digoxin (LANOXIN) 0.25 MG/ML injection 0.25 mg, 0.25 mg, Intravenous, Daily, Barnabas Harries, PA-C, 0.25 mg at 05/24/15 1008 .  feeding supplement (PRO-STAT SUGAR FREE 64) liquid 30 mL, 30 mL, Oral, BID, Vishal Mungal, MD, 30 mL at 05/24/15 1006 .  feeding supplement (VITAL HIGH PROTEIN) liquid 1,000 mL, 1,000 mL, Per Tube, Q24H, Vishal Mungal, MD, 1,000 mL at 05/24/15 0702 .  fentaNYL (SUBLIMAZE) bolus via infusion 50 mcg, 50 mcg, Intravenous, Q1H PRN, Vilinda Boehringer, MD, 50 mcg at 05/23/15 1806 .  fentaNYL  2585mg in NS 2560m(1057mml) infusion-PREMIX, 25-400 mcg/hr, Intravenous, Continuous, VisVilinda BoehringerD, Stopped at 05/24/15 0837 .  fluticasone (FLONASE) 50 MCG/ACT nasal spray 2 spray, 2 spray, Each Nare, Daily, QinDemetrios LollD, 2 spray at 05/24/15 1007 .  free water 100 mL, 100 mL, Per Tube, 3 times per day, Vishal Mungal, MD, 100 mL at 05/24/15 0600 .  furosemide (LASIX) injection 80 mg, 80 mg, Intravenous, BID, RicLoletha GrayerD, 80 mg at 05/24/15 0826 .  heparin injection 1,000 Units, 1,000 Units, Intracatheter, PRN, Munsoor Lateef, MD, 2,600 Units at 05/20/15 1234 .  hydrALAZINE (APRESOLINE) injection 10 mg, 10 mg, Intravenous, Q6H PRN, Vishal Mungal, MD .  hydrALAZINE (APRESOLINE) tablet 25 mg, 25 mg, Oral, 3 times per day, QinDemetrios LollD, 25 mg at 05/24/15 0551 .  HYDROcodone-acetaminophen (NORCO/VICODIN) 5-325 MG per tablet 1 tablet, 1 tablet, Oral, Q6H PRN, RicLoletha GrayerD, 1 tablet at 05/19/15 1953 .  HYDROmorphone (DILAUDID) injection 1 mg, 1 mg, Intravenous, BID, Vishal Mungal, MD, 1 mg at 05/24/15 1008 .  insulin aspart (novoLOG) injection 2-6 Units, 2-6 Units, Subcutaneous, 6 times per day, PatElsie StainD, 2 Units at 05/24/15 0825 .  ipratropium-albuterol (DUONEB) 0.5-2.5 (3) MG/3ML nebulizer solution 3 mL, 3 mL, Nebulization, Q4H, PatElsie StainD, 3 mL at 05/24/15 1102 .  lactulose (CHRONULAC) 10 GM/15ML solution 30 g, 30 g, Oral, TID, Vishal Mungal, MD, 30 g at 05/24/15 1007 .  lidocaine (PF) (XYLOCAINE) 1 % injection 5 mL, 5 mL, Intradermal, PRN, Munsoor Lateef, MD .  lidocaine-prilocaine (EMLA) cream 1 application, 1 application, Topical, PRN, Munsoor Lateef, MD .  methylPREDNISolone sodium succinate (SOLU-MEDROL) 125 mg/2 mL injection 60 mg, 60 mg, Intravenous, Daily, QinDemetrios LollD, 60 mg at 05/24/15 1007 .  metoprolol (LOPRESSOR) injection 5 mg, 5 mg, Intravenous, Q6H PRN, Vishal Mungal, MD .  midazolam (VERSED) injection 2 mg, 2 mg, Intravenous, Q15 min PRN, Vishal Mungal, MD .  midazolam (VERSED) injection 2 mg, 2 mg, Intravenous, Q2H PRN, Vishal Mungal, MD, 2 mg at 05/23/15 1726 .  nitroGLYCERIN (NITROSTAT) SL tablet 0.4 mg, 0.4 mg, Sublingual, Q5 min PRN, RicLoletha GrayerD .  pantoprazole (PROTONIX) injection 40 mg, 40 mg, Intravenous, Q24H, PatElsie StainD, 40 mg at 05/23/15 2157 .  pentafluoroprop-tetrafluoroeth (GEBAUERS) aerosol 1 application, 1 application, Topical, PRN, Munsoor Lateef, MD .  piperacillin-tazobactam (ZOSYN) IVPB 3.375 g, 3.375 g, Intravenous, Q12H, Vishal Mungal, MD, 3.375 g at 05/24/15 1006 .  senna-docusate (Senokot-S) tablet 1 tablet, 1 tablet, Per Tube, BID, VisVilinda BoehringerD, 1 tablet at 05/24/15 1006 .  vancomycin (VANCOCIN) IVPB 1000 mg/200 mL premix, 1,000 mg, Intravenous, Q dialysis, Vishal Mungal, MD, 1,000 mg at 05/23/15 1530  IMAGING    Dg Chest Port 1 View  05/24/2015   CLINICAL DATA:   Congestive heart failure.  EXAM: PORTABLE CHEST 1 VIEW  COMPARISON:  May 22, 2015.  FINDINGS: Stable cardiomegaly and central pulmonary vascular congestion is noted. Bibasilar opacities are noted concerning for combination of edema and pleural effusions. Endotracheal tube is in grossly good position. Distal portion of nasogastric tube is not well visualized. Right internal jugular venous catheter is noted which is unchanged in position compared to prior exam. No pneumothorax is noted.  IMPRESSION: Stable support apparatus. Stable cardiomegaly and central pulmonary vascular congestion is noted, with bilateral edema and effusions consistent with congestive heart failure.   Electronically Signed   By: JamMarijo Conception.D.   On: 05/24/2015 09:37  Indwelling Urinary Catheter continued, requirement due to   Reason to continue Indwelling Urinary Catheter for strict Intake/Output monitoring for hemodynamic instability   Central Line continued, requirement due to   Reason to continue [employer] Monitoring of central venous pressure or other hemodynamic parameters   Ventilator continued, requirement due to, resp failure    Ventilator Sedation RASS 0 to -2      ASSESSMENT/PLAN  54 yo male with CKD, dCHF, COPD on supplemental O2, now with acute respiratory failure on 9/26, intubated.   PULMONARY Acute respiratory failure Requirement for MV Mixed Resp and met acidosis Encephalopathy P:   - following commands, extubated to NRB, may need cpap/bipap at night, cont with incentive spirometry - prn ABG - cont with antibiotics   CARDIOVASCULAR dchf Hypertension Afib P:  - cont with intermittent HD - cont with fluid restriction and monitoring - cardiology following - plan for cardiac cath but patient exceeds the [employer] cath table weight limit, cardiology recommending transfer to another facility once respiratory status is more stable for cath.  - monitor BP - on amiodarone gtt, now  extubated, consider transitioning to PO  RENAL Met acidosis - resolving CKD Oliguria Acute renal failure P:   -cont with HD - renal following -electrolyte replacement during dialysis - improving Cr.   GASTROINTESTINAL PPI prophylaxis   INFECTIOUS Empiric abx with vanc and zosyn Follow up on current cultures. - S. Pneumoniae from BAL, await sensitivities  P:   BCx2 - neg to date BCx2 9/27>>neg to date UC - neg BAL- GPC>>Heavy Growth S. Pneumoniae Abx: vanc/zosyn (renally dose), start date 9/27  LINES: 9/25>>R HD Fem cath  9/27>>RIJ CVL  ENDOCRINE ICU hypo/hyperglycemic protocol P:   -ssi  NEUROLOGIC A:   Encephalopathy - metabolic Lethargy - improved ?withdrawal - EtOH vs narcotic or both.  P:   - correct electrolytes - treat any underlying infection. - hold gabapentin - now extubated, will wean dilaudid -RASS goal: 0  SOCIAL  - I have spoken to the patient's father (HCPOA). Father stated that patient does not have any other family (no children, never married). Father updated on current clinical status.  Father stated that patient is a FULL CODE  Today's Summary: 54 yo m with dchf, copd, acute on chronic respiratory failure. Receiving hemodialysis, more alert and awake today, extubated today.  Plan to wean dilaudid (initially given for possible narcotic withdrawal).  I have personally obtained a history, examined the patient, evaluated laboratory and imaging results, formulated the assessment and plan and placed orders.  The Patient requires high complexity decision making for assessment and support, frequent evaluation and titration of therapies, application of advanced monitoring technologies and extensive interpretation of multiple databases. Critical Care Time devoted to patient care services described in this note is 35 minutes.   Overall, patient is critically ill, prognosis is guarded. Patient at high risk for cardiac arrest and death.   Vilinda Boehringer, MD [facility] Pager [phone] (please enter 7-digits) On Call Pager - [phone] (please enter 7-digits)     05/24/2015, 11:27 AM

## 2015-05-24 NOTE — Progress Notes (Signed)
Pharmacy Consult for Vancomycin/Zosyn Indication: rule out sepsis  Allergies  Allergen Reactions  . Morphine And Related Other (See Comments)    Reaction:  Headaches     Patient Measurements: Height:  (170.2 cm) Weight: (!) 365 lb 4.8 oz (165.7 kg) IBW/kg (Calculated) : 66.1 Adjusted Body Weight: 109.3 kg  Vital Signs: Temp: 98.7 F (37.1 C) (09/30 0800) Temp Source: Oral (09/30 0800) BP: 166/76 mmHg (09/30 0900) Pulse Rate: 88 (09/30 0900) Intake/Output from previous day: 09/29 0701 - 09/30 0700 In: 3343.4 [I.V.:1103.4; NG/GT:1840; IV Piggyback:400] Out: 5115 [Urine:715; Emesis/NG output:950] Intake/Output from this shift: Total I/O In: 192.6 [I.V.:72.6; NG/GT:120] Out: -   Labs:  Recent Labs  05/14/2015 1457 05/22/15 0450 05/22/15 1553 05/23/15 0540  WBC 9.6 9.0  --  4.6  HGB 9.8* 9.8*  --  9.0*  PLT 58* 52*  --  44*  LABCREA  --   --  163  --   CREATININE  --  3.87*  --  3.48*   Estimated Creatinine Clearance: 35.9 mL/min (by C-G formula based on Cr of 3.48). No results for input(s): VANCOTROUGH, VANCOPEAK, VANCORANDOM, GENTTROUGH, GENTPEAK, GENTRANDOM, TOBRATROUGH, TOBRAPEAK, TOBRARND, AMIKACINPEAK, AMIKACINTROU, AMIKACIN in the last 72 hours.   Microbiology: Recent Results (from the past 720 hour(s))  MRSA PCR Screening     Status: None   Collection Time: 05/01/15  9:42 PM  Result Value Ref Range Status   MRSA by PCR NEGATIVE NEGATIVE Final    Comment:        The GeneXpert MRSA Assay (FDA approved for NASAL specimens only), is one component of a comprehensive MRSA colonization surveillance program. It is not intended to diagnose MRSA infection nor to guide or monitor treatment for MRSA infections.   Culture, blood (routine x 2)     Status: None   Collection Time: 05/08/2015  4:10 PM  Result Value Ref Range Status   Specimen Description BLOOD RIGHT AC  Final   Special Requests BOTTLES DRAWN AEROBIC AND ANAEROBIC  9CC  Final   Culture NO GROWTH  5 DAYS  Final   Report Status 05/22/2015 FINAL  Final  Culture, blood (routine x 2)     Status: None   Collection Time: 04/29/2015  4:18 PM  Result Value Ref Range Status   Specimen Description BLOOD LEFT HAND  Final   Special Requests BOTTLES DRAWN AEROBIC AND ANAEROBIC  4CC  Final   Culture NO GROWTH 5 DAYS  Final   Report Status 05/22/2015 FINAL  Final  MRSA PCR Screening     Status: None   Collection Time: 05/20/15  1:53 PM  Result Value Ref Range Status   MRSA by PCR NEGATIVE NEGATIVE Final    Comment:        The GeneXpert MRSA Assay (FDA approved for NASAL specimens only), is one component of a comprehensive MRSA colonization surveillance program. It is not intended to diagnose MRSA infection nor to guide or monitor treatment for MRSA infections.   Culture, bal-quantitative     Status: None (Preliminary result)   Collection Time: 05/21/15  9:28 AM  Result Value Ref Range Status   Specimen Description BRONCHIAL ALVEOLAR LAVAGE  Final   Special Requests Immunocompromised  Final   Gram Stain   Final    EXCELLENT SPECIMEN - 90-100% WBCS MANY WBC SEEN MANY GRAM POSITIVE COCCI IN PAIRS    Culture   Final    HEAVY GROWTH STREPTOCOCCUS PNEUMONIAE SUSCEPTIBILITIES TO FOLLOW    Report Status PENDING  Incomplete  Culture, blood (routine x 2)     Status: None (Preliminary result)   Collection Time: 05/28/2015  2:52 PM  Result Value Ref Range Status   Specimen Description BLOOD LEFT ASSIST CONTROL  Final   Special Requests BAA 8CC AER 8CC  Final   Culture NO GROWTH 2 DAYS  Final   Report Status PENDING  Incomplete  Culture, blood (routine x 2)     Status: None (Preliminary result)   Collection Time: 2015/05/28  3:44 PM  Result Value Ref Range Status   Specimen Description BLOOD LEFT ASSIST CONTROL  Final   Special Requests NONE  Final   Culture NO GROWTH 2 DAYS  Final   Report Status PENDING  Incomplete    Medical History: Past Medical History  Diagnosis Date  .  Hypertension   . COPD (chronic obstructive pulmonary disease)   . Chronic kidney disease   . Chronic back pain greater than 3 months duration   . Phlebitis 1992    left calf  . Anxiety and depression   . CHF (congestive heart failure)   . Hepatitis B     Medications:  Scheduled:  . antiseptic oral rinse  7 mL Mouth Rinse QID  . aspirin EC  81 mg Oral Daily  . atorvastatin  20 mg Oral QHS  . carvedilol  12.5 mg Oral BID WC  . chlorhexidine gluconate  15 mL Mouth Rinse BID  . digoxin  0.25 mg Intravenous Daily  . feeding supplement (PRO-STAT SUGAR FREE 64)  30 mL Oral BID  . feeding supplement (VITAL HIGH PROTEIN)  1,000 mL Per Tube Q24H  . fluticasone  2 spray Each Nare Daily  . free water  100 mL Per Tube 3 times per day  . furosemide  80 mg Intravenous BID  . hydrALAZINE  25 mg Oral 3 times per day  .  HYDROmorphone (DILAUDID) injection  1 mg Intravenous BID  . insulin aspart  2-6 Units Subcutaneous 6 times per day  . ipratropium-albuterol  3 mL Nebulization Q4H  . lactulose  30 g Oral TID  . methylPREDNISolone (SOLU-MEDROL) injection  60 mg Intravenous Daily  . pantoprazole (PROTONIX) IV  40 mg Intravenous Q24H  . piperacillin-tazobactam (ZOSYN)  IV  3.375 g Intravenous Q12H  . senna-docusate  1 tablet Per Tube BID   Infusions:  . amiodarone 30 mg/hr (05/24/15 1021)  . fentaNYL infusion INTRAVENOUS Stopped (05/24/15 0837)   PRN: sodium chloride, sodium chloride, albuterol, alteplase, anticoagulant sodium citrate, fentaNYL, heparin, hydrALAZINE, HYDROcodone-acetaminophen, lidocaine (PF), lidocaine-prilocaine, metoprolol, midazolam, midazolam, nitroGLYCERIN, pentafluoroprop-tetrafluoroeth, vancomycin  Assessment: 54 y/o M with CKD requiring temporary HD this admission ordered empiric abx for presumed sepsis.   Goal of Therapy:  Pre-HD vancomycin level: 15-25, post-HD vancomycin level: 5-15  Plan:  Continue Zosyn 3.375 g EI q 12 hours.  Patient receiving HD with  irregular schedule. Renal MD plans to hold HD today. Will need to order trough prior to HD tomorrow if patient is to be dialyzed again. Will continue to follow plans for HD and culture results.  Luisa Hart D 05/24/2015,10:45 AM

## 2015-05-24 NOTE — Progress Notes (Signed)
Subjective:   Was started on temporary dialysis after failure of diuretic therapy 1st HD done on 9/25 (sunday) Patient remains critically ill Remains intubated and sedated with periods of agitation HR irregular Continues to have significant edema 2500 cc removed with HD UOP appears to be moderate  Objective:  Vital signs in last 24 hours:  Temp:  [98.8 F (37.1 C)-99.2 F (37.3 C)] 99.2 F (37.3 C) (09/30 0400) Pulse Rate:  [65-112] 80 (09/30 0700) Resp:  [12-27] 20 (09/30 0700) BP: (113-169)/(53-98) 137/81 mmHg (09/30 0700) SpO2:  [87 %-100 %] 97 % (09/30 0726) FiO2 (%):  [35 %-50 %] 35 % (09/30 0833) Weight:  [165.2 kg (364 lb 3.2 oz)-167 kg (368 lb 2.7 oz)] 165.7 kg (365 lb 4.8 oz) (09/30 0434)  Weight change: -2.6 kg (-5 lb 11.7 oz) Filed Weights   05/23/15 1330 05/23/15 1637 05/24/15 0434  Weight: 167 kg (368 lb 2.7 oz) 165.2 kg (364 lb 3.2 oz) 165.7 kg (365 lb 4.8 oz)    Intake/Output: I/O last 3 completed shifts: In: 3241.7 [I.V.:1061.7; NG/GT:1780; IV Piggyback:400] Out: 5765 [Urine:1365; Emesis/NG output:950; Other:3450]   Intake/Output this shift:     Physical Exam: General: morbidly obese NAD,  Critically ill appearing  Head: Normocephalic, atraumatic.    E/ENT: Anicteric, moist oral mucus membranes  Neck: Supple,    Lungs:  Clear b/l today, vent assisted, Fio2 35%  Heart: S1S2 no rubs  Abdomen:  Soft, nontender, obese, lower abdominal wall edema noted  Extremities: 2+ b/l LE edema, rt fem temp cath  Neurologic: Alert, following simple commands, not agitated today  Skin:        Basic Metabolic Panel:  Recent Labs Lab 05/19/15 1024 05/20/15 0428 05/20/15 1310 2015/06/13 0517 05/22/15 0450 05/23/15 0540  NA  --  135 137 137 139 140  K  --  5.8* 4.7 4.8 4.4 4.1  CL  --  102 101 103 101 101  CO2  --  GLUCOSE  --  225* 164* 136* 125* 144*  BUN  --  89* 70* 78* 68* 70*  CREATININE  --  4.76* 3.80* 4.39* 3.87* 3.48*  CALCIUM  --   8.4* 8.2* 8.4* 8.2* 8.4*  MG  --   --   --  2.5*  --   --   PHOS 8.3*  --   --  7.4*  --   --     Liver Function Tests:  Recent Labs Lab 04/28/2015 1402 05/23/15 0540  AST 14* 16  ALT 15* 13*  ALKPHOS 62 50  BILITOT 0.8 1.5*  PROT 6.2* 6.1*  ALBUMIN 3.0* 3.0*   No results for input(s): LIPASE, AMYLASE in the last 168 hours.  Recent Labs Lab 13-Jun-2015 0517  AMMONIA 105*    CBC:  Recent Labs Lab 05/20/15 1224 2015-06-13 0517 Jun 13, 2015 1457 05/22/15 0450 05/23/15 0540  WBC 5.6 8.6 9.6 9.0 4.6  HGB 10.1* 9.8* 9.8* 9.8* 9.0*  HCT 31.4* 30.4* 30.6* 30.7* 28.0*  MCV 83.7 82.6 83.0 83.4 84.4  PLT 75* 85* 58* 52* 44*    Cardiac Enzymes:  Recent Labs Lab 05/12/2015 1402 05/22/15 1000 05/22/15 1600 05/22/15 2252  TROPONINI 0.03 0.14* 0.11* 0.10*    BNP: Invalid input(s): POCBNP  CBG:  Recent Labs Lab 05/23/15 1628 05/23/15 1958 05/23/15 2325 05/24/15 0411 05/24/15 0734  GLUCAP 161* 158* 154* 154* 150*    Microbiology: Results for orders placed or performed during the hospital encounter of 05/19/2015  Culture, blood (routine x 2)     Status: None   Collection Time: 04/30/2015  4:10 PM  Result Value Ref Range Status   Specimen Description BLOOD RIGHT AC  Final   Special Requests BOTTLES DRAWN AEROBIC AND ANAEROBIC  9CC  Final   Culture NO GROWTH 5 DAYS  Final   Report Status 05/22/2015 FINAL  Final  Culture, blood (routine x 2)     Status: None   Collection Time: 05/10/2015  4:18 PM  Result Value Ref Range Status   Specimen Description BLOOD LEFT HAND  Final   Special Requests BOTTLES DRAWN AEROBIC AND ANAEROBIC  4CC  Final   Culture NO GROWTH 5 DAYS  Final   Report Status 05/22/2015 FINAL  Final  MRSA PCR Screening     Status: None   Collection Time: 05/20/15  1:53 PM  Result Value Ref Range Status   MRSA by PCR NEGATIVE NEGATIVE Final    Comment:        The GeneXpert MRSA Assay (FDA approved for NASAL specimens only), is one component of  a comprehensive MRSA colonization surveillance program. It is not intended to diagnose MRSA infection nor to guide or monitor treatment for MRSA infections.   Culture, bal-quantitative     Status: None (Preliminary result)   Collection Time: 2015-06-17  9:28 AM  Result Value Ref Range Status   Specimen Description BRONCHIAL ALVEOLAR LAVAGE  Final   Special Requests Immunocompromised  Final   Gram Stain   Final    EXCELLENT SPECIMEN - 90-100% WBCS MANY WBC SEEN MANY GRAM POSITIVE COCCI IN PAIRS    Culture HOLDING FOR POSSIBLE PATHOGEN  Final   Report Status PENDING  Incomplete  Culture, blood (routine x 2)     Status: None (Preliminary result)   Collection Time: 06/17/15  2:52 PM  Result Value Ref Range Status   Specimen Description BLOOD LEFT ASSIST CONTROL  Final   Special Requests BAA 8CC AER 8CC  Final   Culture NO GROWTH 2 DAYS  Final   Report Status PENDING  Incomplete  Culture, blood (routine x 2)     Status: None (Preliminary result)   Collection Time: 17-Jun-2015  3:44 PM  Result Value Ref Range Status   Specimen Description BLOOD LEFT ASSIST CONTROL  Final   Special Requests NONE  Final   Culture NO GROWTH 2 DAYS  Final   Report Status PENDING  Incomplete    Coagulation Studies:  Recent Labs  05/22/15 1000  LABPROT 17.6*  INR 1.43    Urinalysis: No results for input(s): COLORURINE, LABSPEC, PHURINE, GLUCOSEU, HGBUR, BILIRUBINUR, KETONESUR, PROTEINUR, UROBILINOGEN, NITRITE, LEUKOCYTESUR in the last 72 hours.  Invalid input(s): APPERANCEUR    Imaging: Dg Abd 1 View  05/22/2015   CLINICAL DATA:  NG tube placement.  EXAM: ABDOMEN - 1 VIEW  COMPARISON:  Yesterday at 1744 hr  FINDINGS: Tip of the enteric tube in the stomach, side-port likely be on the gastroesophageal junction though not well seen. Soft tissue attenuation from body habitus degrades evaluation. There is air throughout the colon in the included left upper quadrant.  IMPRESSION: Tip of the enteric tube  in the stomach.   Electronically Signed   By: Rubye Oaks M.D.   On: 05/22/2015 21:49   Dg Chest Port 1 View  05/23/2015   CLINICAL DATA:  Ventilator pneumonitis  EXAM: PORTABLE CHEST 1 VIEW  COMPARISON:  Yesterday  FINDINGS: Endotracheal tube tip just below the clavicular heads. The gastric  tube is not visualized below the lower chest, but evaluated on subsequent dedicated abdominal imaging. Right IJ central line, tip at the SVC level.  Improved upper lung visualization, likely from inferior flow pleural fluid. Pulmonary edema persists. Stable cardiomegaly and vascular pedicle widening. No air leak.  IMPRESSION: 1. Visible tubes and central line in unchanged position. 2. CHF. Better visualized upper lungs likely from inferior flow of pleural fluid.   Electronically Signed   By: Marnee Spring M.D.   On: 05/23/2015 02:04     Medications:   . amiodarone 30 mg/hr (05/23/15 1930)  . fentaNYL infusion INTRAVENOUS Stopped (05/24/15 0837)   . antiseptic oral rinse  7 mL Mouth Rinse QID  . aspirin EC  81 mg Oral Daily  . atorvastatin  20 mg Oral QHS  . carvedilol  12.5 mg Oral BID WC  . chlorhexidine gluconate  15 mL Mouth Rinse BID  . digoxin  0.25 mg Intravenous Daily  . feeding supplement (PRO-STAT SUGAR FREE 64)  30 mL Oral BID  . feeding supplement (VITAL HIGH PROTEIN)  1,000 mL Per Tube Q24H  . fluticasone  2 spray Each Nare Daily  . free water  100 mL Per Tube 3 times per day  . furosemide  80 mg Intravenous BID  . hydrALAZINE  25 mg Oral 3 times per day  .  HYDROmorphone (DILAUDID) injection  1 mg Intravenous BID  . insulin aspart  2-6 Units Subcutaneous 6 times per day  . ipratropium-albuterol  3 mL Nebulization Q4H  . lactulose  30 g Oral TID  . methylPREDNISolone (SOLU-MEDROL) injection  60 mg Intravenous Daily  . pantoprazole (PROTONIX) IV  40 mg Intravenous Q24H  . piperacillin-tazobactam (ZOSYN)  IV  3.375 g Intravenous Q12H  . senna-docusate  1 tablet Per Tube BID    sodium chloride, sodium chloride, albuterol, alteplase, anticoagulant sodium citrate, fentaNYL, heparin, hydrALAZINE, HYDROcodone-acetaminophen, lidocaine (PF), lidocaine-prilocaine, metoprolol, midazolam, midazolam, nitroGLYCERIN, pentafluoroprop-tetrafluoroeth, vancomycin  Assessment/ Plan:  Gary Juarez is a 54 y.o.  white male with hepatits B, hypertension, COPD, anemia, thrombocytopenia, morbid obesity, tobacco abuse.  1. 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 Creatinine baseline of 3.5 from 9/16.  Acute renal failure due to acute exacerbation of diastolic heart failure. - Renal function remains poor, pt also with  massive volume overload.   - started on HD. 1st treatment was on 9/25   -  hold gabapentin - may be causing somnolence. May restart after mental status normalized , use renally adjusted dose of 300 mg po qhs - Hold HD. Monitor for now. - re-assess for need of HD tomorrow  2. Pulmonary edema and Acute diastolic heart failure:   - iv lasix 80 BID  3.  Massive generalized edema R60.1:  Significant edema noted,   - improved some   LOS: 7 Gary Juarez,Gary Juarez 9/30/20168:50 AM

## 2015-05-24 NOTE — Progress Notes (Signed)
[facility] [place] at [facility]   PATIENT NAME: Gary Juarez    MR#:  [id number]  DATE OF BIRTH:  02-11-1961  SUBJECTIVE:  CHIEF COMPLAINT:   Chief Complaint  Patient presents with  . Shortness of Breath  Opened eyes upon stimuli, on vent and amiodarone drip.   REVIEW OF SYSTEMS:  Unable to get a ROS.   DRUG ALLERGIES:   Allergies  Allergen Reactions  . Morphine And Related Other (See Comments)    Reaction:  Headaches     VITALS:  Blood pressure 137/81, pulse 80, temperature 99.2 F (37.3 C), temperature source Axillary, resp. rate 20, height  (1.702 m), weight 165.7 kg (365 lb 4.8 oz), SpO2 97 %.  PHYSICAL EXAMINATION:  GENERAL:  54 y.o.-year-old patient lying in the bed with no acute distress. Morbidly obese. Critical ill-looking. EYES: Pupils equal, round, reactive to light and accommodation. No scleral icterus. Extraocular muscles intact.  HEENT: Head atraumatic, normocephalic. Oropharynx and nasopharynx clear. Moist oral mucosa. NECK:  Supple, no jugular venous distention. No thyroid enlargement, no tenderness.  LUNGS: Weak breath sounds bilaterally, no wheezing, mild bilateral crackles and rhonchi. Use of accessory muscles of respiration.  CARDIOVASCULAR: S1, S2 normal. No murmurs, rubs, or gallops.  ABDOMEN: Soft, nontender, but distended. Bowel sounds present. Abdominal wall trace edema. EXTREMITIES: Bilateral lower extremity edema 1+, no cyanosis, or clubbing.  NEUROLOGIC: on vent and sedation,  unable to exam. PSYCHIATRIC: on vent and sedation. SKIN: No obvious rash, lesion, or ulcer.    LABORATORY PANEL:   CBC  Recent Labs Lab 05/23/15 0540  WBC 4.6  HGB 9.0*  HCT 28.0*  PLT 44*   ------------------------------------------------------------------------------------------------------------------  Chemistries   Recent Labs Lab 05/22/2015 0517  05/23/15 0540  NA 137  < > 140  K 4.8  < > 4.1  CL 103  < > 101  CO2  26  < > 30  GLUCOSE 136*  < > 144*  BUN 78*  < > 70*  CREATININE 4.39*  < > 3.48*  CALCIUM 8.4*  < > 8.4*  MG 2.5*  --   --   AST  --   --  16  ALT  --   --  13*  ALKPHOS  --   --  50  BILITOT  --   --  1.5*  < > = values in this interval not displayed. ------------------------------------------------------------------------------------------------------------------  Cardiac Enzymes  Recent Labs Lab 05/22/15 2252  TROPONINI 0.10*   ------------------------------------------------------------------------------------------------------------------  RADIOLOGY:  Dg Abd 1 View  05/22/2015   CLINICAL DATA:  NG tube placement.  EXAM: ABDOMEN - 1 VIEW  COMPARISON:  Yesterday at 1744 hr  FINDINGS: Tip of the enteric tube in the stomach, side-port likely be on the gastroesophageal junction though not well seen. Soft tissue attenuation from body habitus degrades evaluation. There is air throughout the colon in the included left upper quadrant.  IMPRESSION: Tip of the enteric tube in the stomach.   Electronically Signed   By: Rubye Oaks M.D.   On: 05/22/2015 21:49   Dg Chest Port 1 View  05/23/2015   CLINICAL DATA:  Ventilator pneumonitis  EXAM: PORTABLE CHEST 1 VIEW  COMPARISON:  Yesterday  FINDINGS: Endotracheal tube tip just below the clavicular heads. The gastric tube is not visualized below the lower chest, but evaluated on subsequent dedicated abdominal imaging. Right IJ central line, tip at the SVC level.  Improved upper lung visualization, likely from inferior flow pleural fluid.  Pulmonary edema persists. Stable cardiomegaly and vascular pedicle widening. No air leak.  IMPRESSION: 1. Visible tubes and central line in unchanged position. 2. CHF. Better visualized upper lungs likely from inferior flow of pleural fluid.   Electronically Signed   By: Marnee Spring M.D.   On: 05/23/2015 02:04    EKG:   Orders placed or performed during the hospital encounter of 05/22/2015  . ED EKG  .  ED EKG  . EKG 12-Lead  . EKG 12-Lead  . EKG 12-Lead  . EKG 12-Lead    ASSESSMENT AND PLAN:   1. Acute on chronic respiratory failure with hypoxia and hypercapenia.  Try to wean off vent today, f/u pulmonary physician.  2. COPD exacerbation. Continue IV Solu-Medrol, may discontinue vancomycin and zosyn if critical care physician agrees.  Continue budesonide nebulizer, Spiriva and DuoNeb nebulizers.   3. Acute on chronic diastolic congestive heart failure. Continue hemodialysis today, continue Lasix 80 mg IV twice a day.  Coreg was on hold secondary to bradycardia. Bradycardia resolved.  hydralazine and norvasc were on hold secondary to relative hypotension. Blood pressure is high, resumed hydralazine and norvasc. Resumed coreg.  * New onset Afib. Continue amiodarone drip and digoxin, Coreg is increased to 6.25 mg bid, f/u  Dr. Welton Flakes.  4. 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 Started hemodialysis 5 days ago and continue hemodialysis per Dr. Thedore Mins.  5. Morbid obesity and likely sleep apnea 6. History of hepatitis B and liver disease- on lactulose 7. Chronic back pain on chronic pain medications.  Hyperkalemia. Hold spironolactone, improved after hemodialysis. Follow up BMP.  Chronic thrombocytopenia. Stable.   Altered mental status due to acute metabolic encephalopathy,  due to respiratory failure/renal failure/med. On sedation. Hold gabapentin. Fall and aspiration precaution.  I discussed with Dr. Thedore Mins. All the records are reviewed and case discussed with Care Management/Social Workerr. I discussed with the patient's father about the patient's critical condition and Management plans, and he is in agreement.  CODE STATUS: Full code  TOTAL CRITICAL TIME TAKING CARE OF THIS PATIENT: 43 minutes.   POSSIBLE D/C IN >3 DAYS, DEPENDING ON CLINICAL CONDITION.   Shaune Pollack M.D on 05/24/2015 at 8:13 AM  Between 7am to 6pm - Pager -  [phone]  After 6pm go to [email or web address] - password [email or web address] [facility]  [facility]  Office  [phone]  CC: Primary care physician; Verlee Monte, PA-C

## 2015-05-24 NOTE — Progress Notes (Signed)
   SUBJECTIVE: Pt alert this morning, responsive.    Filed Vitals:   05/24/15 0700 05/24/15 0726 05/24/15 0800 05/24/15 0900  BP: 137/81  143/83 166/76  Pulse: 80  73 88  Temp:   98.7 F (37.1 C)   TempSrc:   Oral   Resp: Height:      Weight:      SpO2: 98% 97% 97% 95%    Intake/Output Summary (Last 24 hours) at 05/24/15 0917 Last data filed at 05/24/15 0900  Gross per 24 hour  Intake 2982.52 ml  Output   4765 ml  Net -1782.48 ml    LABS: Basic Metabolic Panel:  Recent Labs  21/30/86 0450 05/23/15 0540  NA 139 140  K 4.4 4.1  CL 101 101  CO2 30 30  GLUCOSE 125* 144*  BUN 68* 70*  CREATININE 3.87* 3.48*  CALCIUM 8.2* 8.4*   Liver Function Tests:  Recent Labs  05/23/15 0540  AST 16  ALT 13*  ALKPHOS 50  BILITOT 1.5*  PROT 6.1*  ALBUMIN 3.0*   No results for input(s): LIPASE, AMYLASE in the last 72 hours. CBC:  Recent Labs  05/22/15 0450 05/23/15 0540  WBC 9.0 4.6  HGB 9.8* 9.0*  HCT 30.7* 28.0*  MCV 83.4 84.4  PLT 52* 44*   Cardiac Enzymes:  Recent Labs  05/22/15 1000 05/22/15 1600 05/22/15 2252  TROPONINI 0.14* 0.11* 0.10*   BNP: Invalid input(s): POCBNP D-Dimer: No results for input(s): DDIMER in the last 72 hours. Hemoglobin A1C: No results for input(s): HGBA1C in the last 72 hours. Fasting Lipid Panel: No results for input(s): CHOL, HDL, LDLCALC, TRIG, CHOLHDL, LDLDIRECT in the last 72 hours. Thyroid Function Tests: No results for input(s): TSH, T4TOTAL, T3FREE, THYROIDAB in the last 72 hours.  Invalid input(s): FREET3 Anemia Panel: No results for input(s): VITAMINB12, FOLATE, FERRITIN, TIBC, IRON, RETICCTPCT in the last 72 hours.   PHYSICAL EXAM General: Obese, disheveled  HEENT: normal Neck: supple. no JVD. Carotids 2+ bilat; no bruits. No lymphadenopathy or thryomegaly appreciated. Cor: PMI nondisplaced. IRIR. No rubs, gallops or murmurs. Lungs: decreased breath sounds b/l. Mild wheezes  Abdomen: soft,  nontender, nondistended. No hepatosplenomegaly. No bruits or masses. Good bowel sounds. Extremities: no cyanosis, clubbing, rash. 2+ pedal edema b/l Neuro: alert & oriented x 0, cranial nerves grossly intact. moves all 4 extremities w/o difficulty.  TELEMETRY: Reviewed telemetry pt in a-fib 80s VR  ASSESSMENT AND PLAN: acute on chronic diastolic dysfunction 2/2 fluid overload. Pt not intubated and sedated 2/2 respiratory failure. Planned for cardiac cath today, but pt weighs too much for [facility] cath table. Once pts respiratory status is stable, consider transfer to [facility] as their table has higher weight limit.   New onset a-fib: continue digoxin 0.25mg  IV, amio gtt. And carvedilol to 12.5mg  BID  Mildly elevated troponin: likely 2/2 demand ischemia and renal insufficiency.   Patient and plan discussed with supervising provider, Dr. Adrian Blackwater, who agrees with above findings.   Gary Juarez [facility]  05/24/2015 9:17 AM

## 2015-05-24 NOTE — Progress Notes (Signed)
   05/24/15 1245  Clinical Encounter Type  Visited With Patient  Visit Type Spiritual support  Consult/Referral To Chaplain  Spiritual Encounters  Spiritual Needs Emotional  Stress Factors  Patient Stress Factors None identified  Chaplain rounded in the unit and offered a compassionate presence to the patient. Offered our services and he asked for an apple. He was unable to have an apple so I informed him and he dozed off.  Chaplain Sonya A. Laws Ext. [phone]

## 2015-05-24 NOTE — Progress Notes (Signed)
Palliative Medicine Inpatient Consult Follow Up Note   Name: Gary Juarez Date: 05/24/2015 MRN: [id number]  DOB: January 27, 1961  Referring Physician: Shaune Pollack, MD  Palliative Care consult requested for this 54 y.o. male for goals of medical therapy in patient with acute on chronic respiratory failure. He was discharged from this hospital for acute on chronic respiratory failure He had home health visiting him in the home and the nurse advised him to go to the hospital on 28-May-2015 due to a gain of 10 lbs in 3 days and obvious dyspnea and chest tightness. During his most recent admission, he showed initial improvement in ER after Narcan. He has had hemodialysis daily Mon - Thurs but not today.  He is extubated and on Hi Flow oxygen currently.  He is talking and moaning loudly intermittently due to his chronic lower back pain.     IMPRESSION: Metabolic Encephalopathy thought due to CO2 narcosis (second admission for same in same month) ---now alert and oriented but he closes his eyes and avoids conversation at times Acute on chronic respiratory failure ---intubated and ventilated for several days but now extubated ---multifactorial Due to CHF, renal failure, obesity hypoventilation, OSA, and COPD COPD exacerbation  Acute on Chronic diastolic CHF ---recent echo showed nl LVF but LVH and diastolic dysfxn --to have outpt work up Acute on Chronic kidney failure (CKD stage IV since earlier this month --previously stage III)) Morbid Obesity Probable Sleep Apnea H/O Hepatitis B with liver disease  ---on lactulose Chronic Back Pain on chronic pain medications Essential HTN Anxiety and Depression H/O CAD Hyperkalemia Dyslipidemia H/O left leg phlebitis Thrombocytopenia --now worsening Anemia of unclear etiology Tobacco Smoker for 20 years (quit 12/13/14) Hyperglycemia Chronic Low Back Pain    TODAY'S DISCUSSIONS, DECISIONS, AND PLANS:  1.  Pt requests DNR status.  Father witnessed  pt ask for this.  2.  Pt requests DO NOT RE-INTUBATE .  Father witnessed pt discuss this with me.   3.  Pt is OK with current treatments for now.  He is not comfort care status.  But, we are limited as to how much we can discuss at a time with him, so if he does not improve regularly, we will have to discuss many other issues.  Right now, he does not have a concept about dialysis, placement, etc as these have not been brought up in detail with him.    4.  Pt said all he wants is 'to go home'. His father says that is no longer an option. His father had been letting his son live in a property for decades without paying rent. Pt was to keep up utilities but has not been doing that and his father cannot let him return.  Pt does not know this yet.  We will have to allow this discussion related to disposition come up when we know more about his immediate post-discharge needs.   5.  He gets furious if we call him anything but Gary Juarez'.  FYI.    6.  He needs some symptom management.  Dilaudid is rxd at low doses po.  He had an allergy listed to morphine but that was only b/c it 'causes a headache' so I DCd that allergy.  Discussed with nursing. Pt may be a challenge to care for given his hollering out in pain.   7.  I talked with nursing and with pts father and his sister with counseling taking up much of this visit. Sister is going back home  now  (out of town).     REVIEW OF SYSTEMS:  Patient is not able to provide ROS in full detail b/c I had to focus on getting him to converse about code status, etc. He indicated he was tired of talking before giving full ROS.  He does say his back has hurt him 'all his life'. He denies taking narcotics at home for this pain, but he was hollering out for some time while I was here and nursing spoke to me several times about his complaint of pain.   CODE STATUS: DNR as of my discussion about this with pt.  Also DO NOT REINTUBATE.   PAST MEDICAL HISTORY: Past Medical  History  Diagnosis Date  . Hypertension   . COPD (chronic obstructive pulmonary disease)   . Chronic kidney disease   . Chronic back pain greater than 3 months duration   . Phlebitis 1992    left calf  . Anxiety and depression   . CHF (congestive heart failure)   . Hepatitis B     PAST SURGICAL HISTORY:  Past Surgical History  Procedure Laterality Date  . Wisdom tooth extraction      Vital Signs: BP 146/92 mmHg  Pulse 72  Temp(Src) 98.4 F (36.9 C) (Oral)  Resp 8  Ht  (1.702 m)  Wt 165.7 kg (365 lb 4.8 oz)  BMI 57.20 kg/m2  SpO2 99% Filed Weights   05/23/15 1330 05/23/15 1637 05/24/15 0434  Weight: 167 kg (368 lb 2.7 oz) 165.2 kg (364 lb 3.2 oz) 165.7 kg (365 lb 4.8 oz)    Estimated body mass index is 57.2 kg/(m^2) as calculated from the following:   Height as of this encounter:  (1.702 m).   Weight as of this encounter: 165.7 kg (365 lb 4.8 oz).  PHYSICAL EXAM: Awake On Hi Flow Oxygen EOMI OP clear Neck hard to see b/c of beard  Hrt rrr no mgr Lungs cta Abd soft and NT Skin warm and dry Neuro --Alert and oriented  LABS: CBC:    Component Value Date/Time   WBC 4.6 05/23/2015 0540   WBC 4.9 12/12/2014 2234   HGB 9.0* 05/23/2015 0540   HGB 11.2* 12/12/2014 2234   HCT 28.0* 05/23/2015 0540   HCT 34.9* 12/12/2014 2234   PLT 44* 05/23/2015 0540   PLT 94* 12/12/2014 2234   MCV 84.4 05/23/2015 0540   MCV 89 12/12/2014 2234   NEUTROABS 9.1* 05/01/2015 1651   NEUTROABS 3.0 12/12/2014 2234   LYMPHSABS 1.1 05/01/2015 1651   LYMPHSABS 0.7* 12/12/2014 2234   MONOABS 2.0* 05/01/2015 1651   MONOABS 1.0 12/12/2014 2234   EOSABS 0.2 05/01/2015 1651   EOSABS 0.2 12/12/2014 2234   BASOSABS 0.0 05/01/2015 1651   BASOSABS 0.0 12/12/2014 2234   Comprehensive Metabolic Panel:    Component Value Date/Time   NA 140 05/23/2015 0540   NA 140 12/12/2014 2234   K 4.1 05/23/2015 0540   K 4.5 12/12/2014 2234   CL 101 05/23/2015 0540   CL 100* 12/12/2014  2234   CO2 30 05/23/2015 0540   CO2 34* 12/12/2014 2234   BUN 70* 05/23/2015 0540   BUN 36* 12/12/2014 2234   CREATININE 3.48* 05/23/2015 0540   CREATININE 2.57* 12/12/2014 2234   GLUCOSE 144* 05/23/2015 0540   GLUCOSE 107* 12/12/2014 2234   CALCIUM 8.4* 05/23/2015 0540   CALCIUM 8.6* 12/12/2014 2234   AST 16 05/23/2015 0540   AST 31 12/12/2014 2234   ALT  13* 05/23/2015 0540   ALT 18 12/12/2014 2234   ALKPHOS 50 05/23/2015 0540   ALKPHOS 71 12/12/2014 2234   BILITOT 1.5* 05/23/2015 0540   BILITOT 1.3* 12/12/2014 2234   PROT 6.1* 05/23/2015 0540   PROT 6.5 12/12/2014 2234   ALBUMIN 3.0* 05/23/2015 0540   ALBUMIN 3.2* 12/12/2014 2234    REFERRALS TO BE ORDERED:  None thus far  More than 50% of the visit was spent in counseling/coordination of care: YES  Time Spent:  80 min

## 2015-05-24 NOTE — Progress Notes (Signed)
Patient is placed high fowlers position, cuff deflated, suctioned orally and endotracheally and then extubated to 65% Fio2 and 45 lpm HFNC.

## 2015-05-24 NOTE — Progress Notes (Signed)
Nutrition Follow-up    INTERVENTION:    Nutrition related medication management: pt continues without BM, recommend further intervention regarding bowel regimen Meals and Snacks: await diet progression post extubation   NUTRITION DIAGNOSIS:   Inadequate oral intake related to acute illness as evidenced by NPO status.  GOAL:   Provide needs based on ASPEN/SCCM guidelines   MONITOR:    (Energy Intake, Anthropometrics, Digestive System, Pulmonary, Electrolyte/Renal profile, Glucose Profile)  REASON FOR ASSESSMENT:   Consult, Ventilator Enteral/tube feeding initiation and management  ASSESSMENT:    Pt s/p extubation this AM, currently on HFNC, HD on hold at present, MD assessing need for HD daily    Diet Order:  Diet NPO time specified   EN: TF held for extubation; pt had been tolerating with residuals <500 mL   Skin:  Reviewed, no issues  Last BM:  9/25   Electrolyte and Renal Profile:  Recent Labs Lab 05/19/15 1024  04/29/2015 0517 05/22/15 0450 05/23/15 0540  BUN  --   < > 78* 68* 70*  CREATININE  --   < > 4.39* 3.87* 3.48*  NA  --   < > 137 139 140  K  --   < > 4.8 4.4 4.1  MG  --   --  2.5*  --   --   PHOS 8.3*  --  7.4*  --   --   < > = values in this interval not displayed. Glucose Profile:  Recent Labs  05/24/15 0411 05/24/15 0734 05/24/15 1201  GLUCAP 154* 150* 167*   Meds: lactulose, senokot, lasix, ss novolog, solumedrol  Height:   Ht Readings from Last 1 Encounters:  05/03/2015  (1.702 m)    Weight:   Wt Readings from Last 1 Encounters:  05/24/15 365 lb 4.8 oz (165.7 kg)    Ideal Body Weight:     BMI:  Body mass index is 57.2 kg/(m^2).  Estimated Nutritional Needs:   Kcal:  1610-9604 kcals (22-25 kcals/kg IBW per ASPEN guidelines for BMI >50)  Protein:  134-168 g (2.0-2.5 g/kg)   Fluid:  1000 mL plus UOP   HIGH Care Level  Romelle Starcher MS, RD, LDN [phone] Pager

## 2015-05-25 DIAGNOSIS — E662 Morbid (severe) obesity with alveolar hypoventilation: Secondary | ICD-10-CM

## 2015-05-25 DIAGNOSIS — J9612 Chronic respiratory failure with hypercapnia: Secondary | ICD-10-CM

## 2015-05-25 LAB — CBC
HEMATOCRIT: 30.5 % — AB (ref 40.0–52.0)
HEMOGLOBIN: 9.7 g/dL — AB (ref 13.0–18.0)
MCH: 26.4 pg (ref 26.0–34.0)
MCHC: 31.8 g/dL — AB (ref 32.0–36.0)
MCV: 83.1 fL (ref 80.0–100.0)
Platelets: 52 10*3/uL — ABNORMAL LOW (ref 150–440)
RBC: 3.67 MIL/uL — ABNORMAL LOW (ref 4.40–5.90)
RDW: 19.9 % — ABNORMAL HIGH (ref 11.5–14.5)
WBC: 12.4 10*3/uL — ABNORMAL HIGH (ref 3.8–10.6)

## 2015-05-25 LAB — RENAL FUNCTION PANEL
ANION GAP: 9 (ref 5–15)
Albumin: 2.8 g/dL — ABNORMAL LOW (ref 3.5–5.0)
BUN: 71 mg/dL — ABNORMAL HIGH (ref 6–20)
CALCIUM: 8.1 mg/dL — AB (ref 8.9–10.3)
CHLORIDE: 97 mmol/L — AB (ref 101–111)
CO2: 30 mmol/L (ref 22–32)
Creatinine, Ser: 3.16 mg/dL — ABNORMAL HIGH (ref 0.61–1.24)
GFR calc Af Amer: 24 mL/min — ABNORMAL LOW (ref >60)
GFR calc non Af Amer: 21 mL/min — ABNORMAL LOW (ref >60)
GLUCOSE: 149 mg/dL — AB (ref 65–99)
Phosphorus: 4.3 mg/dL (ref 2.5–4.6)
Potassium: 3.6 mmol/L (ref 3.5–5.1)
SODIUM: 136 mmol/L (ref 135–145)

## 2015-05-25 LAB — CULTURE, BAL-QUANTITATIVE W GRAM STAIN

## 2015-05-25 LAB — CULTURE, BAL-QUANTITATIVE

## 2015-05-25 LAB — GLUCOSE, CAPILLARY
GLUCOSE-CAPILLARY: 119 mg/dL — AB (ref 65–99)
GLUCOSE-CAPILLARY: 132 mg/dL — AB (ref 65–99)
Glucose-Capillary: 131 mg/dL — ABNORMAL HIGH (ref 65–99)
Glucose-Capillary: 228 mg/dL — ABNORMAL HIGH (ref 65–99)
Glucose-Capillary: 151 mg/dL — ABNORMAL HIGH (ref 65–99)

## 2015-05-25 LAB — AMMONIA: AMMONIA: 94 umol/L — AB (ref 9–35)

## 2015-05-25 MED ORDER — DEXTROSE 5 % IV SOLN
500.0000 mg | INTRAVENOUS | Status: DC
  Administered 2015-05-25 – 2015-05-26 (×2): 500 mg via INTRAVENOUS
  Filled 2015-05-25 (×4): qty 500

## 2015-05-25 MED ORDER — AMIODARONE HCL 200 MG PO TABS
400.0000 mg | ORAL_TABLET | Freq: Two times a day (BID) | ORAL | Status: DC
  Administered 2015-05-25 – 2015-05-26 (×4): 400 mg via ORAL
  Filled 2015-05-25 (×4): qty 2

## 2015-05-25 MED ORDER — RISPERIDONE 0.5 MG PO TBDP
0.5000 mg | ORAL_TABLET | Freq: Two times a day (BID) | ORAL | Status: DC
  Administered 2015-05-25 – 2015-05-26 (×4): 0.5 mg via ORAL
  Filled 2015-05-25 (×4): qty 1

## 2015-05-25 NOTE — Progress Notes (Signed)
SUBJECTIVE: Patient is extubated and sitting on chair complaining of some weakness and wants to go home.   Filed Vitals:   05/25/15 0618 05/25/15 0700 05/25/15 0726 05/25/15 0800  BP: 134/83 139/74  135/109  Pulse:  71  71  Temp:    98 F (36.7 C)  TempSrc:    Oral  Resp:  18  15  Height:      Weight:      SpO2:  98% 98% 98%    Intake/Output Summary (Last 24 hours) at 05/25/15 1006 Last data filed at 05/24/15 2200  Gross per 24 hour  Intake  310.4 ml  Output    750 ml  Net -439.6 ml    LABS: Basic Metabolic Panel:  Recent Labs  52/84/13 0540  NA 140  K 4.1  CL 101  CO2 30  GLUCOSE 144*  BUN 70*  CREATININE 3.48*  CALCIUM 8.4*   Liver Function Tests:  Recent Labs  05/23/15 0540  AST 16  ALT 13*  ALKPHOS 50  BILITOT 1.5*  PROT 6.1*  ALBUMIN 3.0*   No results for input(s): LIPASE, AMYLASE in the last 72 hours. CBC:  Recent Labs  05/23/15 0540  WBC 4.6  HGB 9.0*  HCT 28.0*  MCV 84.4  PLT 44*   Cardiac Enzymes:  Recent Labs  05/22/15 1600 05/22/15 2252  TROPONINI 0.11* 0.10*   BNP: Invalid input(s): POCBNP D-Dimer: No results for input(s): DDIMER in the last 72 hours. Hemoglobin A1C: No results for input(s): HGBA1C in the last 72 hours. Fasting Lipid Panel: No results for input(s): CHOL, HDL, LDLCALC, TRIG, CHOLHDL, LDLDIRECT in the last 72 hours. Thyroid Function Tests: No results for input(s): TSH, T4TOTAL, T3FREE, THYROIDAB in the last 72 hours.  Invalid input(s): FREET3 Anemia Panel: No results for input(s): VITAMINB12, FOLATE, FERRITIN, TIBC, IRON, RETICCTPCT in the last 72 hours.   PHYSICAL EXAM General: Well developed, well nourished, in no acute distress HEENT:  Normocephalic and atramatic Neck:  No JVD.  Lungs: Clear bilaterally to auscultation and percussion. Heart: HRRR . Normal S1 and S2 without gallops or murmurs.  Abdomen: Bowel sounds are positive, abdomen soft and non-tender  Msk:  Back normal, normal gait.  Normal strength and tone for age. Extremities: No clubbing, cyanosis or edema.   Neuro: Alert and oriented X 3. Psych:  Good affect, responds appropriately  TELEMETRY: Monitor shows atrial fibrillation 80 bpm  ASSESSMENT AND PLAN: Atrial fibrillation with controlled ventricular response rate. Mildly elevated troponin due to demand ischemia. Patient is not a candidate for cardiac catheterization due to weight over the 380 pounds. May consider cardiac catheterization once the weight is lower or refer the patient to [facility]  Active Problems:   Acute on chronic respiratory failure with hypoxia   Acute respiratory failure with hypoxia   Difficult intubation   Congestive heart disease   Encephalopathy, metabolic   Lethargy   Acute renal failure    Waunita Sandstrom A, MD, [facility] 05/25/2015 10:06 AM

## 2015-05-25 NOTE — Evaluation (Signed)
Physical Therapy Evaluation Patient Details Name: Gary Juarez MRN: [id number] DOB: 05/01/1961 Today's Date: 05/25/2015   History of Present Illness  Pt was hospitalized ~3 weeks ago, went home and had difficulty breathing, needed intubation is now feeling better  Clinical Impression  Pt generally does well with PT but is heavily reliant on the walker and needs only occasional SPC use at baseline.  He is able to walk ~75 ft but is very fatigued with the effort.  He did take his O2 off before walking and his sats drops steadily down to mid 80s before putting it back on and quickly returning to high 90s.    Follow Up Recommendations Home health PT    Equipment Recommendations  Rolling walker with 5" wheels    Recommendations for Other Services       Precautions / Restrictions Precautions Precautions: Fall Restrictions Weight Bearing Restrictions: No      Mobility  Bed Mobility               General bed mobility comments: pt sitting at EOB on arrival  Transfers Overall transfer level: Needs assistance Equipment used: Rolling walker (2 wheeled) Transfers: Sit to/from Stand Sit to Stand: Min assist         General transfer comment: Pt struggles to get to full upright and needs slight assist to get to upright  Ambulation/Gait Ambulation/Gait assistance: Min guard Ambulation Distance (Feet): 75 Feet Assistive device: Rolling walker (2 wheeled)       General Gait Details: Pt with slow, shuffling gait, but has no LOBs or true safety concerns. He does need 2 standing rest breaks and has one bout of L calf cramping.  Pt is very reliant on the walker.  Stairs            Wheelchair Mobility    Modified Rankin (Stroke Patients Only)       Balance                                             Pertinent Vitals/Pain Pain Location: reports general back pain from laying in bed for days, not rated    Home Living Family/patient expects to  be discharged to:: Private residence Living Arrangements: Alone Available Help at Discharge: Friend(s)   Home Access: Stairs to enter Entrance Stairs-Rails: None Entrance Stairs-Number of Steps: 4 Home Layout: One level Home Equipment: Cane - single point      Prior Function Level of Independence: Independent with assistive device(s)   Gait / Transfers Assistance Needed: Independent (occasionally uses SPC); pt ambulates maximum 1 city block baseline     Comments: pt reports that he does get out of the house multiple times a week     Hand Dominance        Extremity/Trunk Assessment   Upper Extremity Assessment: Overall WFL for tasks assessed (L side weaker than R)           Lower Extremity Assessment: Generalized weakness (L side weaker than R)         Communication   Communication: No difficulties  Cognition Arousal/Alertness: Awake/alert Behavior During Therapy: WFL for tasks assessed/performed Overall Cognitive Status: Impaired/Different from baseline                      General Comments      Exercises  Assessment/Plan    PT Assessment Patient needs continued PT services  PT Diagnosis Generalized weakness;Difficulty walking   PT Problem List Decreased strength;Decreased activity tolerance;Decreased balance;Decreased mobility;Pain  PT Treatment Interventions DME instruction;Gait training;Stair training;Functional mobility training;Therapeutic activities;Therapeutic exercise;Balance training;Patient/family education   PT Goals (Current goals can be found in the Care Plan section) Acute Rehab PT Goals Patient Stated Goal: "I want to go back home" PT Goal Formulation: With patient Time For Goal Achievement: 06/08/15 Potential to Achieve Goals: Good    Frequency Min 2X/week   Barriers to discharge        Co-evaluation               End of Session Equipment Utilized During Treatment: Gait belt;Oxygen (2 liters) Activity  Tolerance: Patient limited by fatigue Patient left: in bed;with nursing/sitter in room Nurse Communication: Mobility status         Time: 1610-9604 PT Time Calculation (min) (ACUTE ONLY): 21 min   Charges:   PT Evaluation $Initial PT Evaluation Tier I: 1 Procedure     PT G Codes:       Loran Senters, PT, DPT [phone]  Malachi Pro 05/25/2015, 2:42 PM

## 2015-05-25 NOTE — Progress Notes (Signed)
[facility] [place] [facility]   PATIENT NAME: Gary Juarez    MR#:  [id number]  DATE OF BIRTH:  Feb 04, 1961  SUBJECTIVE:  CHIEF COMPLAINT:   Chief Complaint  Patient presents with  . Shortness of Breath  .  Extubated y-day, feels relatively good REVIEW OF SYSTEMS:  Less sob, some cough, no sputum  DRUG ALLERGIES:   No Active Allergies  VITALS:  Blood pressure 134/83, pulse 71, temperature 98.4 F (36.9 C), temperature source Oral, resp. rate 12, height  (1.702 m), weight 165.7 kg (365 lb 4.8 oz), SpO2 98 %.  PHYSICAL EXAMINATION:  GENERAL:  54 y.o.-year-old patient lying in the bed with no acute distress. Morbidly obese. Comfortable, still some confused, talkative , no significant dyspnea EYES: Pupils equal, round, reactive to light and accommodation. No scleral icterus. Extraocular muscles intact.  HEENT: Head atraumatic, normocephalic. Oropharynx and nasopharynx clear. Moist oral mucosa. NECK:  Supple, no jugular venous distention. No thyroid enlargement, no tenderness.  LUNGS: Diminished breath sounds bilaterally at bases, no wheezing, mild bilateral crackles and rhonchi. Not using accessory muscles of respiration, unless on exertion.  CARDIOVASCULAR: S1, S2 normal. No murmurs, rubs, or gallops.  ABDOMEN: Soft, nontender, but distended. Bowel sounds present. Abdominal wall trace edema. EXTREMITIES: Bilateral lower extremity edema 2+, no cyanosis, or clubbing.  NEUROLOGIC: Off vent and sedation,  unable to exam, muscle strength 5 over out of 5 in 4 extremities. PSYCHIATRIC: Of vent and sedation, irritable, . SKIN: No obvious rash, lesion, or ulcer.    LABORATORY PANEL:   CBC  Recent Labs Lab 05/23/15 0540  WBC 4.6  HGB 9.0*  HCT 28.0*  PLT 44*   ------------------------------------------------------------------------------------------------------------------  Chemistries   Recent Labs Lab 05/18/2015 0517  05/23/15 0540  NA  137  < > 140  K 4.8  < > 4.1  CL 103  < > 101  CO2 26  < > 30  GLUCOSE 136*  < > 144*  BUN 78*  < > 70*  CREATININE 4.39*  < > 3.48*  CALCIUM 8.4*  < > 8.4*  MG 2.5*  --   --   AST  --   --  16  ALT  --   --  13*  ALKPHOS  --   --  50  BILITOT  --   --  1.5*  < > = values in this interval not displayed. ------------------------------------------------------------------------------------------------------------------  Cardiac Enzymes  Recent Labs Lab 05/22/15 2252  TROPONINI 0.10*   ------------------------------------------------------------------------------------------------------------------  RADIOLOGY:  Dg Chest Port 1 View  05/24/2015   CLINICAL DATA:  Congestive heart failure.  EXAM: PORTABLE CHEST 1 VIEW  COMPARISON:  May 22, 2015.  FINDINGS: Stable cardiomegaly and central pulmonary vascular congestion is noted. Bibasilar opacities are noted concerning for combination of edema and pleural effusions. Endotracheal tube is in grossly good position. Distal portion of nasogastric tube is not well visualized. Right internal jugular venous catheter is noted which is unchanged in position compared to prior exam. No pneumothorax is noted.  IMPRESSION: Stable support apparatus. Stable cardiomegaly and central pulmonary vascular congestion is noted, with bilateral edema and effusions consistent with congestive heart failure.   Electronically Signed   By: Lupita Raider, M.D.   On: 05/24/2015 09:37    EKG:   Orders placed or performed during the hospital encounter of 06/01/15  . ED EKG  . ED EKG  . EKG 12-Lead  . EKG 12-Lead  . EKG 12-Lead  .  EKG 12-Lead    ASSESSMENT AND PLAN:   1. Acute on chronic respiratory failure with hypoxia and hypercapenia. Extubated today, now on high flow oxygen through nasal cannulas at 32%    2. COPD exacerbation. Continue IV Solu-Medrol,  Zosyn, off vancomycin .  Continue budesonide nebulizer, Spiriva and DuoNeb nebulizers.   3.  Acute on chronic diastolic congestive heart failure. Continue hemodialysis, next one today per Dr. Thedore Mins , continue Lasix 80 mg IV twice a day, urine output is 750 cc in 24 hours. 24 hour urine creatinine collection will be a entertained by Dr. Thedore Mins upon patient's stabilization, may need permanent hemodialysis catheter.  4. Bradycardia due to Coreg, which was on hold. Bradycardia resolved.  5. Hypotension hydralazine and norvasc were on hold secondary to relative hypotension. Now Blood pressure is high, resumed hydralazine and norvasc. Resumed coreg.  6 New onset Afib. Discontinue amiodarone drip, change to oral and discontinue digoxin, Coreg was increased to 12.5 mg bid, f/u  Dr. Welton Flakes.  7. 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 Started hemodialysis 5 days ago and continue hemodialysis per Dr. Thedore Mins, next session today, patient may need permanent hemodialysis catheter upon discharge. Pending: Urine creatinine clearance.  8. Morbid obesity and likely sleep apnea, uses oxygen at home only. No CPAP 9. History of hepatitis B and liver disease- continue lactulose, check ammonia level 10. Chronic back pain on chronic pain medications.  11. Hyperkalemia. Hold spironolactone, improved after hemodialysis. Follow up BMP.  12. Chronic thrombocytopenia. Stable.   13. Altered mental status due to acute metabolic encephalopathy,  due to respiratory failure/renal failure/med. Of sedation. Holding gabapentin. Fall and aspiration precaution.  Discussed with Dr. Thedore Mins. All the records are reviewed and case discussed with Care Management/Social Workerr. I discussed with the patient's father about the patient's critical condition and Management plans, and he is in agreement.  CODE STATUS: Full code  TOTAL CRITICAL TIME TAKING CARE OF THIS PATIENT: .   POSSIBLE D/C IN >3 DAYS, DEPENDING ON CLINICAL CONDITION.   Katharina Caper M.D on 05/25/2015 at 7:55  AM  Between 7am to 6pm - Pager - [phone]  After 6pm go to [email or web address] - password [email or web address] [email or web address]  [place]  Office  [phone]  CC: Primary care physician; Verlee Monte, PA-C

## 2015-05-25 NOTE — Progress Notes (Signed)
Pt refused to wear Bipap. Pt remains on HFNC at 40%. O2 SAT 96%. Nurse notified.

## 2015-05-25 NOTE — Progress Notes (Signed)
[place] [facility] Progess Note    ASSESSMENT/PLAN      ASSESSMENT/PLAN  54 yo male with CKD, dCHF, COPD on supplemental O2, now with acute respiratory failure on 9/26, intubated, subsequently extubated on 9-30.  PULMONARY S/p Acute respiratory failure, now extubated. Chronic hypercapnic respiratory failure with elevated CO2 level. Encephalopathy Acute bronchitis.   P:  - following commands, extubated to NRB, may need cpap/bipap at night, cont with incentive spirometry - prn ABG - cont with antibiotics . -The patient will need to be on BiPAP at night. We will change Zosyn to azithromycin to complete a 5 day course.  CARDIOVASCULAR dchf Hypertension Afib P:  - cont with intermittent HD - cont with fluid restriction and monitoring - cardiology following - plan for cardiac cath but patient exceeds the [facility] cath table weight limit, cardiology recommending transfer to another facility once respiratory status is more stable for cath.  - monitor BP - on amiodarone by mouth.  RENAL Met acidosis - resolving CKD Oliguria Acute renal failure P:  -cont with HD - renal following -electrolyte replacement during dialysis - improving Cr.   GASTROINTESTINAL PPI prophylaxis   INFECTIOUS Empiric abx with vanc and zosyn Follow up on current cultures. - S. Pneumoniae from BAL, await sensitivities  P:  BCx2 - neg to date BCx2 9/27>>neg to date UC - neg BAL- GPC>>Heavy Growth S. Pneumoniae Abx: vanc/zosyn (renally dose), start date 9/27, stopped on 05/25/2015, started on azithromycin 500 mg daily for 5 days total.  LINES: 9/25>>R HD Fem cath  9/27>>RIJ CVL  ENDOCRINE ICU hypo/hyperglycemic protocol P:  -ssi  NEUROLOGIC A:  Encephalopathy - metabolic Lethargy - improved ?withdrawal - EtOH vs narcotic or both.  P:  - correct electrolytes - treat any underlying infection. - hold gabapentin - now extubated, will wean  dilaudid -RASS goal: 0         ---------------------------------------   ----------------------------------------   Name: Gary Juarez MRN: [id number] DOB: Feb 12, 1961    ADMISSION DATE:  05/16/2015    CHIEF COMPLAINT:  dyspnea   STUDIES:      SUBJECTIVE:   Patient has no new complaints today. He continues to have mild dyspnea.  Review of Systems:  Constitutional: Feels well. Cardiovascular: No chest pain.  Pulmonary: Denies dyspnea.   The remainder of systems were reviewed and were found to be negative other than what is documented in the HPI.    VITAL SIGNS: Temp:  [98 F (36.7 C)-98.8 F (37.1 C)] 98 F (36.7 C) (10/01 0800) Pulse Rate:  [61-86] 71 (10/01 0800) Resp:  [8-32] 15 (10/01 0800) BP: (120-162)/(65-109) 135/109 mmHg (10/01 0800) SpO2:  [89 %-100 %] 98 % (10/01 0800) FiO2 (%):  [40 %-50 %] 40 % (10/01 0726) HEMODYNAMICS:   VENTILATOR SETTINGS: Vent Mode:  [-]  FiO2 (%):  [40 %-50 %] 40 % INTAKE / OUTPUT:  Intake/Output Summary (Last 24 hours) at 05/25/15 1036 Last data filed at 05/24/15 2200  Gross per 24 hour  Intake  260.4 ml  Output    750 ml  Net -489.6 ml    PHYSICAL EXAMINATION: Physical Examination:   VS: BP 135/109 mmHg  Pulse 71  Temp(Src) 98 F (36.7 C) (Oral)  Resp 15  Ht 5' 7"  (1.702 m)  Wt 165.7 kg (365 lb 4.8 oz)  BMI 57.20 kg/m2  SpO2 98%  General Appearance: No distress  Neuro:without focal findings, mental status normal. HEENT: PERRLA, EOM intact. Pulmonary: normal breath sounds   CardiovascularNormal S1,S2.  No m/r/g.   Abdomen: Benign, Soft, non-tender. Renal:  No costovertebral tenderness  GU:  Not performed at this time. Endocrine: No evident thyromegaly. Skin:   warm, no rashes, no ecchymosis  Extremities: normal, no cyanosis, clubbing.   LABS:   LABORATORY PANEL:   CBC  Recent Labs Lab 05/23/15 0540  WBC 4.6  HGB 9.0*  HCT 28.0*  PLT 44*    Chemistries   Recent Labs Lab  05/15/2015 0517  05/23/15 0540  NA 137  < > 140  K 4.8  < > 4.1  CL 103  < > 101  CO2 26  < > 30  GLUCOSE 136*  < > 144*  BUN 78*  < > 70*  CREATININE 4.39*  < > 3.48*  CALCIUM 8.4*  < > 8.4*  MG 2.5*  --   --   PHOS 7.4*  --   --   AST  --   --  16  ALT  --   --  13*  ALKPHOS  --   --  50  BILITOT  --   --  1.5*  < > = values in this interval not displayed.   Recent Labs Lab 05/24/15 1201 05/24/15 1641 05/24/15 1954 05/24/15 2351 05/25/15 0346 05/25/15 0716  GLUCAP 167* 188* 131* 158* 131* 119*    Recent Labs Lab 05/22/15 1532 05/22/15 1715 05/23/15 0502  PHART 7.33* 7.36 7.38  PCO2ART 61* 58* 54*  PO2ART 62* 69* 81*    Recent Labs Lab 05/23/15 0540  AST 16  ALT 13*  ALKPHOS 50  BILITOT 1.5*  ALBUMIN 3.0*    Cardiac Enzymes  Recent Labs Lab 05/22/15 2252  TROPONINI 0.10*    RADIOLOGY:  Dg Chest Port 1 View  05/24/2015   CLINICAL DATA:  Congestive heart failure.  EXAM: PORTABLE CHEST 1 VIEW  COMPARISON:  May 22, 2015.  FINDINGS: Stable cardiomegaly and central pulmonary vascular congestion is noted. Bibasilar opacities are noted concerning for combination of edema and pleural effusions. Endotracheal tube is in grossly good position. Distal portion of nasogastric tube is not well visualized. Right internal jugular venous catheter is noted which is unchanged in position compared to prior exam. No pneumothorax is noted.  IMPRESSION: Stable support apparatus. Stable cardiomegaly and central pulmonary vascular congestion is noted, with bilateral edema and effusions consistent with congestive heart failure.   Electronically Signed   By: Marijo Conception, M.D.   On: 05/24/2015 09:37       --Marda Stalker, MD.  Pager [phone] [facility] Office Number: [phone]  Patricia Pesa, M.D.  Vilinda Boehringer, M.D.  Merton Border, M.D  [place].  I have personally obtained a history, examined the patient,  evaluated laboratory and imaging results, formulated the assessment and plan and placed orders. The Patient requires high complexity decision making for assessment and support, frequent evaluation and titration of therapies, application of advanced monitoring technologies and extensive interpretation of multiple databases. The patient has critical illness that could lead imminently to failure of 1 or more organ systems and requires the highest level of physician preparedness to intervene.  Critical Care Time devoted to patient care services described in this note is 35 minutes and is exclusive of time spent in procedures.

## 2015-05-25 NOTE — Progress Notes (Signed)
Subjective:   Was started on temporary dialysis after failure of diuretic therapy 1st HD done on 9/25 (sunday) Extubated now HR irregular Continues to have significant edema    Objective:  Vital signs in last 24 hours:  Temp:  [98.4 F (36.9 C)-98.8 F (37.1 C)] 98.4 F (36.9 C) (09/30 1600) Pulse Rate:  [61-89] 71 (10/01 0600) Resp:  [8-32] 12 (10/01 0600) BP: (120-169)/(65-104) 134/83 mmHg (10/01 0618) SpO2:  [89 %-100 %] 98 % (10/01 0726) FiO2 (%):  [35 %-50 %] 40 % (10/01 0726)  Weight change:  Filed Weights   05/23/15 1330 05/23/15 1637 05/24/15 0434  Weight: 167 kg (368 lb 2.7 oz) 165.2 kg (364 lb 3.2 oz) 165.7 kg (365 lb 4.8 oz)    Intake/Output: I/O last 3 completed shifts: In: 2436.4 [I.V.:872.4; NG/GT:1314; IV Piggyback:250] Out: 2650 [Urine:750; Emesis/NG output:950; Other:950]   Intake/Output this shift:     Physical Exam: General: morbidly obese NAD,  Critically ill appearing  Head: Normocephalic, atraumatic.    E/ENT: Anicteric, moist oral mucus membranes  Neck: Supple,    Lungs:  HFNC, Fio2 32%  Heart: S1S2 no rubs  Abdomen:  Soft, nontender, obese, lower abdominal wall edema noted  Extremities: 2+ b/l LE edema, rt fem temp cath  Neurologic: Alert, following simple commands,   Skin:        Basic Metabolic Panel:  Recent Labs Lab 05/19/15 1024 05/20/15 0428 05/20/15 1310 17-Jun-2015 0517 05/22/15 0450 05/23/15 0540  NA  --  135 137 137 139 140  K  --  5.8* 4.7 4.8 4.4 4.1  CL  --  102 101 103 101 101  CO2  --  GLUCOSE  --  225* 164* 136* 125* 144*  BUN  --  89* 70* 78* 68* 70*  CREATININE  --  4.76* 3.80* 4.39* 3.87* 3.48*  CALCIUM  --  8.4* 8.2* 8.4* 8.2* 8.4*  MG  --   --   --  2.5*  --   --   PHOS 8.3*  --   --  7.4*  --   --     Liver Function Tests:  Recent Labs Lab 05/23/15 0540  AST 16  ALT 13*  ALKPHOS 50  BILITOT 1.5*  PROT 6.1*  ALBUMIN 3.0*   No results for input(s): LIPASE, AMYLASE in the last  168 hours.  Recent Labs Lab Jun 17, 2015 0517  AMMONIA 105*    CBC:  Recent Labs Lab 05/20/15 1224 June 17, 2015 0517 June 17, 2015 1457 05/22/15 0450 05/23/15 0540  WBC 5.6 8.6 9.6 9.0 4.6  HGB 10.1* 9.8* 9.8* 9.8* 9.0*  HCT 31.4* 30.4* 30.6* 30.7* 28.0*  MCV 83.7 82.6 83.0 83.4 84.4  PLT 75* 85* 58* 52* 44*    Cardiac Enzymes:  Recent Labs Lab 05/22/15 1000 05/22/15 1600 05/22/15 2252  TROPONINI 0.14* 0.11* 0.10*    BNP: Invalid input(s): POCBNP  CBG:  Recent Labs Lab 05/24/15 1641 05/24/15 1954 05/24/15 2351 05/25/15 0346 05/25/15 0716  GLUCAP 188* 131* 158* 131* 119*    Microbiology: Results for orders placed or performed during the hospital encounter of 05/04/2015  Culture, blood (routine x 2)     Status: None   Collection Time: 05/16/2015  4:10 PM  Result Value Ref Range Status   Specimen Description BLOOD RIGHT [facility]  Final   Special Requests BOTTLES DRAWN AEROBIC AND ANAEROBIC  9CC  Final   Culture NO GROWTH 5 DAYS  Final   Report Status 05/22/2015  FINAL  Final  Culture, blood (routine x 2)     Status: None   Collection Time: 05/15/2015  4:18 PM  Result Value Ref Range Status   Specimen Description BLOOD LEFT HAND  Final   Special Requests BOTTLES DRAWN AEROBIC AND ANAEROBIC  4CC  Final   Culture NO GROWTH 5 DAYS  Final   Report Status 05/22/2015 FINAL  Final  MRSA PCR Screening     Status: None   Collection Time: 05/20/15  1:53 PM  Result Value Ref Range Status   MRSA by PCR NEGATIVE NEGATIVE Final    Comment:        The GeneXpert MRSA Assay (FDA approved for NASAL specimens only), is one component of a comprehensive MRSA colonization surveillance program. It is not intended to diagnose MRSA infection nor to guide or monitor treatment for MRSA infections.   Culture, bal-quantitative     Status: None (Preliminary result)   Collection Time: 05/02/2015  9:28 AM  Result Value Ref Range Status   Specimen Description BRONCHIAL ALVEOLAR LAVAGE  Final    Special Requests Immunocompromised  Final   Gram Stain   Final    EXCELLENT SPECIMEN - 90-100% WBCS MANY WBC SEEN MANY GRAM POSITIVE COCCI IN PAIRS    Culture   Final    HEAVY GROWTH STREPTOCOCCUS PNEUMONIAE SUSCEPTIBILITIES TO FOLLOW    Report Status PENDING  Incomplete  Culture, blood (routine x 2)     Status: None (Preliminary result)   Collection Time: 05/19/2015  2:52 PM  Result Value Ref Range Status   Specimen Description BLOOD LEFT ASSIST CONTROL  Final   Special Requests BAA 8CC AER 8CC  Final   Culture NO GROWTH 2 DAYS  Final   Report Status PENDING  Incomplete  Culture, blood (routine x 2)     Status: None (Preliminary result)   Collection Time: 05/07/2015  3:44 PM  Result Value Ref Range Status   Specimen Description BLOOD LEFT ASSIST CONTROL  Final   Special Requests NONE  Final   Culture NO GROWTH 2 DAYS  Final   Report Status PENDING  Incomplete    Coagulation Studies:  Recent Labs  05/22/15 1000  LABPROT 17.6*  INR 1.43    Urinalysis: No results for input(s): COLORURINE, LABSPEC, PHURINE, GLUCOSEU, HGBUR, BILIRUBINUR, KETONESUR, PROTEINUR, UROBILINOGEN, NITRITE, LEUKOCYTESUR in the last 72 hours.  Invalid input(s): APPERANCEUR    Imaging: Dg Chest Port 1 View  05/24/2015   CLINICAL DATA:  Congestive heart failure.  EXAM: PORTABLE CHEST 1 VIEW  COMPARISON:  May 22, 2015.  FINDINGS: Stable cardiomegaly and central pulmonary vascular congestion is noted. Bibasilar opacities are noted concerning for combination of edema and pleural effusions. Endotracheal tube is in grossly good position. Distal portion of nasogastric tube is not well visualized. Right internal jugular venous catheter is noted which is unchanged in position compared to prior exam. No pneumothorax is noted.  IMPRESSION: Stable support apparatus. Stable cardiomegaly and central pulmonary vascular congestion is noted, with bilateral edema and effusions consistent with congestive heart failure.    Electronically Signed   By: Lupita Raider, M.D.   On: 05/24/2015 09:37     Medications:   . amiodarone 30 mg/hr (05/25/15 0600)   . aspirin EC  81 mg Oral Daily  . atorvastatin  20 mg Oral QHS  . carvedilol  12.5 mg Oral BID WC  . digoxin  0.25 mg Intravenous Daily  . fluticasone  2 spray Each Nare Daily  .  furosemide  80 mg Intravenous BID  . hydrALAZINE  25 mg Oral 3 times per day  . insulin aspart  2-6 Units Subcutaneous 6 times per day  . ipratropium-albuterol  3 mL Nebulization Q4H  . lactulose  30 g Oral TID  . methylPREDNISolone (SOLU-MEDROL) injection  60 mg Intravenous Daily  . pantoprazole (PROTONIX) IV  40 mg Intravenous Q24H  . piperacillin-tazobactam (ZOSYN)  IV  3.375 g Intravenous Q12H  . senna-docusate  1 tablet Per Tube BID   sodium chloride, sodium chloride, albuterol, alteplase, anticoagulant sodium citrate, hydrALAZINE, HYDROmorphone, HYDROmorphone, ketorolac, lidocaine (PF), lidocaine-prilocaine, metoprolol, nitroGLYCERIN, pentafluoroprop-tetrafluoroeth  Assessment/ Plan:  Mr. GARLON TUGGLE is a 54 y.o.  white male with hepatits B, hypertension, COPD, anemia, thrombocytopenia, morbid obesity, tobacco abuse.  1. 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 Creatinine baseline of 3.5 from 9/16.  Acute renal failure due to acute exacerbation of diastolic heart failure. - Renal function remains poor, pt also with  massive volume overload.   - started on HD. 1st treatment was on 9/25   -  hold gabapentin - may be causing somnolence. May restart after mental status normalized , use renally adjusted dose of 300 mg po qhs - HD today - May need 24 hr urine Cr Cl next week, and if low, will need permcath also  2. Pulmonary edema and Acute diastolic heart failure:   - iv lasix 80 BID  3.  Massive generalized edema R60.1:  ,   - improved some - dialysis today   LOS:  8 Vera Wishart 10/1/20167:53 AM

## 2015-05-25 DEATH — deceased

## 2015-05-26 LAB — GLUCOSE, CAPILLARY
GLUCOSE-CAPILLARY: 171 mg/dL — AB (ref 65–99)
GLUCOSE-CAPILLARY: 135 mg/dL — AB (ref 65–99)
GLUCOSE-CAPILLARY: 107 mg/dL — AB (ref 65–99)
Glucose-Capillary: 145 mg/dL — ABNORMAL HIGH (ref 65–99)
Glucose-Capillary: 156 mg/dL — ABNORMAL HIGH (ref 65–99)
Glucose-Capillary: 197 mg/dL — ABNORMAL HIGH (ref 65–99)

## 2015-05-26 LAB — CULTURE, BLOOD (ROUTINE X 2)
CULTURE: NO GROWTH
Culture: NO GROWTH

## 2015-05-26 LAB — BASIC METABOLIC PANEL
ANION GAP: 13 (ref 5–15)
BUN: 76 mg/dL — AB (ref 6–20)
CALCIUM: 8.4 mg/dL — AB (ref 8.9–10.3)
CO2: 27 mmol/L (ref 22–32)
CREATININE: 3.55 mg/dL — AB (ref 0.61–1.24)
Chloride: 97 mmol/L — ABNORMAL LOW (ref 101–111)
GFR calc Af Amer: 21 mL/min — ABNORMAL LOW (ref >60)
GFR, EST NON AFRICAN AMERICAN: 18 mL/min — AB (ref >60)
GLUCOSE: 195 mg/dL — AB (ref 65–99)
Potassium: 4 mmol/L (ref 3.5–5.1)
Sodium: 137 mmol/L (ref 135–145)

## 2015-05-26 LAB — CBC
HCT: 30.3 % — ABNORMAL LOW (ref 40.0–52.0)
Hemoglobin: 9.7 g/dL — ABNORMAL LOW (ref 13.0–18.0)
MCH: 26.6 pg (ref 26.0–34.0)
MCHC: 32.2 g/dL (ref 32.0–36.0)
MCV: 82.7 fL (ref 80.0–100.0)
PLATELETS: 61 10*3/uL — AB (ref 150–440)
RBC: 3.66 MIL/uL — ABNORMAL LOW (ref 4.40–5.90)
RDW: 20.3 % — AB (ref 11.5–14.5)
WBC: 14.2 10*3/uL — AB (ref 3.8–10.6)

## 2015-05-26 MED ORDER — IPRATROPIUM-ALBUTEROL 0.5-2.5 (3) MG/3ML IN SOLN: 3.0000 mL | RESPIRATORY_TRACT | Status: DC | PRN

## 2015-05-26 MED ORDER — RIFAXIMIN 550 MG PO TABS
550.0000 mg | ORAL_TABLET | Freq: Two times a day (BID) | ORAL | Status: DC
  Administered 2015-05-26 (×2): 550 mg via ORAL
  Filled 2015-05-26 (×2): qty 1

## 2015-05-26 MED ORDER — ALPRAZOLAM 0.5 MG PO TABS
0.5000 mg | ORAL_TABLET | Freq: Once | ORAL | Status: AC
  Administered 2015-05-26: 0.5 mg via ORAL
  Filled 2015-05-26: qty 1

## 2015-05-26 MED ORDER — HEPARIN SODIUM (PORCINE) 1000 UNIT/ML IJ SOLN
2300.0000 [IU] | Freq: Once | INTRAMUSCULAR | Status: AC
  Administered 2015-05-26: 2300 [IU]

## 2015-05-26 NOTE — Progress Notes (Signed)
Sitting in chair, states his bottom hurts 10/10.  Dilaudid  po given.

## 2015-05-26 NOTE — Care Management Note (Signed)
Case Management Note  Patient Details  Name: [name] MRN: [id number] Date of Birth: [birth year]  Subjective/Objective:    53yo [name] was extubated 05/24/15 and today is back to this baseline chronic 2L N/C. Creatinine=3.55 and BUN=76 today. Receiving Lasix  IV BID. Walked 75 ft on 05/25/15 with PT. Request for MD order for bariatric front wheeled walker placed in physician sticky notes. [name] is an open client of [facility].                  Action/Plan:   Expected Discharge Date:                  Expected Discharge Plan:     In-House Referral:     Discharge planning Services     Post Acute Care Choice:    Choice offered to:     DME Arranged:    DME Agency:     HH Arranged:    HH Agency:     Status of Service:     Medicare Important Message Given:    Date Medicare IM Given:    Medicare IM give by:    Date Additional Medicare IM Given:    Additional Medicare Important Message give by:     If discussed at Long Length of Stay Meetings, dates discussed:    Additional Comments:  Jud Fanguy A, RN 05/26/2015, 2:20 PM

## 2015-05-26 NOTE — Progress Notes (Signed)
Pt placed on overnight oxymetry. Pt on 2L [place]. Pt wears 2L [place] at home. Pt is awake at this time with O2 sat of 89% and HR of 91

## 2015-05-26 NOTE — Progress Notes (Signed)
SUBJECTIVE: feeling much better, no complaints.   Filed Vitals:   05/26/15 0309 05/26/15 0410 05/26/15 0812 05/26/15 1129  BP:  112/60 141/81 97/58  Pulse:  85 81 70  Temp:  98.2 F (36.8 C) 98.4 F (36.9 C) 99.1 F (37.3 C)  TempSrc:  Oral Oral Oral  Resp:  Height:      Weight:  364 lb 12.8 oz (165.472 kg)    SpO2: 91% 94% 94%     Intake/Output Summary (Last 24 hours) at 05/26/15 1133 Last data filed at 05/26/15 0947  Gross per 24 hour  Intake    480 ml  Output   3130 ml  Net  -2650 ml    LABS: Basic Metabolic Panel:  Recent Labs  16/10/96 2233 05/26/15 0414  NA 136 137  K 3.6 4.0  CL 97* 97*  CO2 30 27  GLUCOSE 149* 195*  BUN 71* 76*  CREATININE 3.16* 3.55*  CALCIUM 8.1* 8.4*  PHOS 4.3  --    Liver Function Tests:  Recent Labs  05/25/15 2233  ALBUMIN 2.8*   No results for input(s): LIPASE, AMYLASE in the last 72 hours. CBC:  Recent Labs  05/25/15 2233 05/26/15 0414  WBC 12.4* 14.2*  HGB 9.7* 9.7*  HCT 30.5* 30.3*  MCV 83.1 82.7  PLT 52* 61*   Cardiac Enzymes: No results for input(s): CKTOTAL, CKMB, CKMBINDEX, TROPONINI in the last 72 hours. BNP: Invalid input(s): POCBNP D-Dimer: No results for input(s): DDIMER in the last 72 hours. Hemoglobin A1C: No results for input(s): HGBA1C in the last 72 hours. Fasting Lipid Panel: No results for input(s): CHOL, HDL, LDLCALC, TRIG, CHOLHDL, LDLDIRECT in the last 72 hours. Thyroid Function Tests: No results for input(s): TSH, T4TOTAL, T3FREE, THYROIDAB in the last 72 hours.  Invalid input(s): FREET3 Anemia Panel: No results for input(s): VITAMINB12, FOLATE, FERRITIN, TIBC, IRON, RETICCTPCT in the last 72 hours.   PHYSICAL EXAM General: Well developed, well nourished, in no acute distress HEENT:  Normocephalic and atramatic Neck:  No JVD.  Lungs: Clear bilaterally to auscultation and percussion. Heart: HRRR . Normal S1 and S2 without gallops or murmurs.  Abdomen: Bowel sounds are  positive, abdomen soft and non-tender  Msk:  Back normal, normal gait. Normal strength and tone for age. Extremities: No clubbing, cyanosis or edema.   Neuro: Alert and oriented X 3. Psych:  Good affect, responds appropriately  TELEMETRY: afib with controlled rate  ASSESSMENT AND PLAN:    Laurier Nancy, MD Physician Signed Cardiology Progress Notes 05/25/2015 10:06 AM    Expand All Collapse All   SUBJECTIVE: Patient is extubated and sitting on chair complaining of some weakness and wants to go home.   Filed Vitals:   05/25/15 0618 05/25/15 0700 05/25/15 0726 05/25/15 0800  BP: 134/83 139/74  135/109  Pulse:  71  71  Temp:    98 F (36.7 C)  TempSrc:    Oral  Resp:  18  15  Height:      Weight:      SpO2:  98% 98% 98%    Intake/Output Summary (Last 24 hours) at 05/25/15 1006 Last data filed at 05/24/15 2200  Gross per 24 hour  Intake 310.4 ml  Output  750 ml  Net -439.6 ml    LABS: Basic Metabolic Panel:  Recent Labs (last 2 labs)      Recent Labs  05/23/15 0540  NA 140  K 4.1  CL 101  CO2  30  GLUCOSE 144*  BUN 70*  CREATININE 3.48*  CALCIUM 8.4*     Liver Function Tests:  Recent Labs (last 2 labs)      Recent Labs  05/23/15 0540  AST 16  ALT 13*  ALKPHOS 50  BILITOT 1.5*  PROT 6.1*  ALBUMIN 3.0*      Recent Labs (last 2 labs)     No results for input(s): LIPASE, AMYLASE in the last 72 hours.   CBC:  Recent Labs (last 2 labs)      Recent Labs  05/23/15 0540  WBC 4.6  HGB 9.0*  HCT 28.0*  MCV 84.4  PLT 44*     Cardiac Enzymes:  Recent Labs (last 2 labs)      Recent Labs  05/22/15 1600 05/22/15 2252  TROPONINI 0.11* 0.10*     BNP:  Recent Labs (last 2 labs)     Invalid input(s): POCBNP   D-Dimer:  Recent Labs (last 2 labs)     No results for input(s): DDIMER in the last 72 hours.   Hemoglobin A1C:  Recent  Labs (last 2 labs)     No results for input(s): HGBA1C in the last 72 hours.   Fasting Lipid Panel:  Recent Labs (last 2 labs)     No results for input(s): CHOL, HDL, LDLCALC, TRIG, CHOLHDL, LDLDIRECT in the last 72 hours.   Thyroid Function Tests:  Recent Labs (last 2 labs)     No results for input(s): TSH, T4TOTAL, T3FREE, THYROIDAB in the last 72 hours.  Invalid input(s): FREET3   Anemia Panel:  Recent Labs (last 2 labs)     No results for input(s): VITAMINB12, FOLATE, FERRITIN, TIBC, IRON, RETICCTPCT in the last 72 hours.     PHYSICAL EXAM General: Well developed, well nourished, in no acute distress HEENT: Normocephalic and atramatic Neck: No JVD.  Lungs: Clear bilaterally to auscultation and percussion. Heart: HRRR . Normal S1 and S2 without gallops or murmurs.  Abdomen: Bowel sounds are positive, abdomen soft and non-tender  Msk: Back normal, normal gait. Normal strength and tone for age. Extremities: No clubbing, cyanosis or edema.  Neuro: Alert and oriented X 3. Psych: Good affect, responds appropriately  TELEMETRY: Monitor shows atrial fibrillation 80 bpm  ASSESSMENT AND PLAN: Atrial fibrillation with controlled ventricular response rate. Mildly elevated troponin due to demand ischemia. Patient is not a candidate for cardiac catheterization due to weight over the 380 pounds. May consider cardiac catheterization once the weight is lower or refer the patient to [facility].  Active Problems:  Acute on chronic respiratory failure with hypoxia  Acute respiratory failure with hypoxia  Difficult intubation  Congestive heart disease  Encephalopathy, metabolic  Lethargy  Acute renal failure    Tj Kitchings A, MD, [facility] 05/25/2015 10:06 AM                        Active Problems:   Acute on chronic respiratory failure with hypoxia (HCC)   Acute respiratory failure with hypoxia (HCC)   Difficult intubation   Congestive  heart disease (HCC)   Encephalopathy, metabolic   Lethargy   Acute renal failure (HCC)    Favor Hackler A, MD, [facility] 05/26/2015 11:33 AM

## 2015-05-26 NOTE — Progress Notes (Signed)
[facility] Progess Note    ASSESSMENT/PLAN      ASSESSMENT/PLAN  54 yo male with CKD, dCHF, COPD on supplemental O2, now with acute respiratory failure on 9/26, intubated, subsequently extubated on 9-30.  PULMONARY S/p Acute respiratory failure, now extubated. He is been weaned down to 2 L nasal cannula and subsequently down to room air. Chronic hypercapnic respiratory failure with elevated CO2 level. Acute bronchitis, with strep pneumonia, currently on azithromycin.  P:  - Azithromycin for 5 days. -The patient will need to be on BiPAP at night. -Consult social worker to see if patient qualifies for home BiPAP. We'll have the patient undergo an overnight oximetry tonight. -. Should the patient not qualify for home BiPAP and he will need an outpatient sleep study to look for evidence of obstructive sleep apnea.   CARDIOVASCULAR dchf Hypertension Afib P:  - cont with intermittent HD - cont with fluid restriction and monitoring - cardiology following - plan for cardiac cath but patient exceeds the [facility] cath table weight limit, cardiology recommending transfer to another facility once respiratory status is more stable for cath.  - monitor BP - on amiodarone by mouth.  RENAL Met acidosis - resolving CKD Oliguria Acute renal failure P:  -cont with HD - renal following   GASTROINTESTINAL PPI prophylaxis   INFECTIOUS Empiric abx with vanc and zosyn Follow up on current cultures. - S. Pneumoniae from BAL, await sensitivities   NEUROLOGIC A:  Encephalopathy - metabolic Lethargy - improved ?withdrawal - EtOH vs narcotic or both.          ---------------------------------------   ----------------------------------------   Name: Gary Juarez MRN: [id number] DOB: July 04, 1961    ADMISSION DATE:  05/09/2015    CHIEF COMPLAINT:  dyspnea   STUDIES:      SUBJECTIVE:   Patient has no new complaints today. He  continues to have mild dyspnea.  Review of Systems:  Constitutional: Feels well. Cardiovascular: No chest pain.  Pulmonary: Denies dyspnea.   The remainder of systems were reviewed and were found to be negative other than what is documented in the HPI.    VITAL SIGNS: Temp:  [97.6 F (36.4 C)-99.1 F (37.3 C)] 99.1 F (37.3 C) (10/02 1129) Pulse Rate:  [70-108] 70 (10/02 1129) Resp:  [12-23] 18 (10/02 1129) BP: (97-158)/(58-91) 97/58 mmHg (10/02 1129) SpO2:  [86 %-97 %] 94 % (10/02 0812) Weight:  [165.472 kg (364 lb 12.8 oz)] 165.472 kg (364 lb 12.8 oz) (10/02 0410) HEMODYNAMICS:   VENTILATOR SETTINGS:   INTAKE / OUTPUT:  Intake/Output Summary (Last 24 hours) at 05/26/15 1423 Last data filed at 05/26/15 1305  Gross per 24 hour  Intake    730 ml  Output   3330 ml  Net  -2600 ml    PHYSICAL EXAMINATION: Physical Examination:   VS: BP 97/58 mmHg  Pulse 70  Temp(Src) 99.1 F (37.3 C) (Oral)  Resp 18  Ht 5' 7"  (1.702 m)  Wt 165.472 kg (364 lb 12.8 oz)  BMI 57.12 kg/m2  SpO2 94%  General Appearance: No distress  Neuro:without focal findings, mental status normal. HEENT: PERRLA, EOM intact. Pulmonary: normal breath sounds   CardiovascularNormal S1,S2.  No m/r/g.   Abdomen: Benign, Soft, non-tender. Renal:  No costovertebral tenderness  GU:  Not performed at this time. Endocrine: No evident thyromegaly. Skin:   warm, no rashes, no ecchymosis  Extremities: normal, no cyanosis, clubbing.   LABS:   LABORATORY PANEL:   CBC  Recent  Labs Lab 05/26/15 0414  WBC 14.2*  HGB 9.7*  HCT 30.3*  PLT 61*    Chemistries   Recent Labs Lab 04/28/2015 0517  05/23/15 0540 05/25/15 2233 05/26/15 0414  NA 137  < > 140 136 137  K 4.8  < > 4.1 3.6 4.0  CL 103  < > 101 97* 97*  CO2 26  < > 30 30 27   GLUCOSE 136*  < > 144* 149* 195*  BUN 78*  < > 70* 71* 76*  CREATININE 4.39*  < > 3.48* 3.16* 3.55*  CALCIUM 8.4*  < > 8.4* 8.1* 8.4*  MG 2.5*  --   --   --   --     PHOS 7.4*  --   --  4.3  --   AST  --   --  16  --   --   ALT  --   --  13*  --   --   ALKPHOS  --   --  50  --   --   BILITOT  --   --  1.5*  --   --   < > = values in this interval not displayed.   Recent Labs Lab 05/25/15 1124 05/25/15 1623 05/25/15 1941 05/26/15 0006 05/26/15 0412 05/26/15 1130  GLUCAP 132* 228* 151* 145* 197* 171*    Recent Labs Lab 05/22/15 1532 05/22/15 1715 05/23/15 0502  PHART 7.33* 7.36 7.38  PCO2ART 61* 58* 54*  PO2ART 62* 69* 81*    Recent Labs Lab 05/23/15 0540 05/25/15 2233  AST 16  --   ALT 13*  --   ALKPHOS 50  --   BILITOT 1.5*  --   ALBUMIN 3.0* 2.8*    Cardiac Enzymes  Recent Labs Lab 05/22/15 2252  TROPONINI 0.10*    RADIOLOGY:  No results found.     --Marda Stalker, MD.  [facility]  Patricia Pesa, M.D.  Vilinda Boehringer, M.D.  Merton Border, M.D

## 2015-05-26 NOTE — Progress Notes (Signed)
Assisted to [facility] then back to bed

## 2015-05-26 NOTE — Progress Notes (Signed)
Pt has not worn Bipap. Pt has not been asleep the entire shift.

## 2015-05-26 NOTE — Progress Notes (Signed)
Subjective:   Was started on temporary dialysis after failure of diuretic therapy 1st HD done on 9/25 (sunday) Extubated now  And moved to a regular room. Father at bedside HR irregular Continues to have significant edema Mental status has improved significantly     Objective:  Vital signs in last 24 hours:  Temp:  [97.6 F (36.4 C)-98.4 F (36.9 C)] 98.4 F (36.9 C) (10/02 0812) Pulse Rate:  [71-108] 81 (10/02 0812) Resp:  [12-23] 18 (10/02 0812) BP: (108-158)/(60-91) 141/81 mmHg (10/02 0812) SpO2:  [86 %-98 %] 94 % (10/02 0812) Weight:  [165.472 kg (364 lb 12.8 oz)] 165.472 kg (364 lb 12.8 oz) (10/02 0410)  Weight change:  Filed Weights   05/23/15 1637 05/24/15 0434 05/26/15 0410  Weight: 165.2 kg (364 lb 3.2 oz) 165.7 kg (365 lb 4.8 oz) 165.472 kg (364 lb 12.8 oz)    Intake/Output: I/O last 3 completed shifts: In: 50.1 [I.V.:50.1] Out: 3130 [Urine:1130; Other:2000]   Intake/Output this shift:  Total I/O In: 480 [P.O.:480] Out: -   Physical Exam: General: morbidly obese NAD,  Critically ill appearing  Head: Normocephalic, atraumatic.    E/ENT: Anicteric, moist oral mucus membranes  Neck: Supple,    Lungs:   clear anteriorly, laterally. Oxygen by nasal cannula   Heart: S1S2 no rubs  Abdomen:  Soft, nontender, obese, lower abdominal wall edema noted  Extremities: 2+ b/l LE edema, rt fem temp cath  Neurologic: Alert, following simple commands, mental status back to baseline   Skin:        Basic Metabolic Panel:  Recent Labs Lab 05/07/2015 0517 05/22/15 0450 05/23/15 0540 05/25/15 2233 05/26/15 0414  NA 137 139 140 136 137  K 4.8 4.4 4.1 3.6 4.0  CL 103 101 101 97* 97*  CO2 GLUCOSE 136* 125* 144* 149* 195*  BUN 78* 68* 70* 71* 76*  CREATININE 4.39* 3.87* 3.48* 3.16* 3.55*  CALCIUM 8.4* 8.2* 8.4* 8.1* 8.4*  MG 2.5*  --   --   --   --   PHOS 7.4*  --   --  4.3  --     Liver Function Tests:  Recent Labs Lab 05/23/15 0540  05/25/15 2233  AST 16  --   ALT 13*  --   ALKPHOS 50  --   BILITOT 1.5*  --   PROT 6.1*  --   ALBUMIN 3.0* 2.8*   No results for input(s): LIPASE, AMYLASE in the last 168 hours.  Recent Labs Lab 05/14/2015 0517 05/25/15 1040  AMMONIA 105* 94*    CBC:  Recent Labs Lab 05/07/2015 1457 05/22/15 0450 05/23/15 0540 05/25/15 2233 05/26/15 0414  WBC 9.6 9.0 4.6 12.4* 14.2*  HGB 9.8* 9.8* 9.0* 9.7* 9.7*  HCT 30.6* 30.7* 28.0* 30.5* 30.3*  MCV 83.0 83.4 84.4 83.1 82.7  PLT 58* 52* 44* 52* 61*    Cardiac Enzymes:  Recent Labs Lab 05/22/15 1000 05/22/15 1600 05/22/15 2252  TROPONINI 0.14* 0.11* 0.10*    BNP: Invalid input(s): POCBNP  CBG:  Recent Labs Lab 05/25/15 1124 05/25/15 1623 05/25/15 1941 05/26/15 0006 05/26/15 0412  GLUCAP 132* 228* 151* 145* 197*    Microbiology: Results for orders placed or performed during the hospital encounter of 2015/06/11  Culture, blood (routine x 2)     Status: None   Collection Time: 06/11/15  4:10 PM  Result Value Ref Range Status   Specimen Description BLOOD RIGHT [facility]  Final   Special Requests  BOTTLES DRAWN AEROBIC AND ANAEROBIC  9CC  Final   Culture NO GROWTH 5 DAYS  Final   Report Status 05/22/2015 FINAL  Final  Culture, blood (routine x 2)     Status: None   Collection Time: June 16, 2015  4:18 PM  Result Value Ref Range Status   Specimen Description BLOOD LEFT HAND  Final   Special Requests BOTTLES DRAWN AEROBIC AND ANAEROBIC  4CC  Final   Culture NO GROWTH 5 DAYS  Final   Report Status 05/22/2015 FINAL  Final  MRSA PCR Screening     Status: None   Collection Time: 05/20/15  1:53 PM  Result Value Ref Range Status   MRSA by PCR NEGATIVE NEGATIVE Final    Comment:        The GeneXpert MRSA Assay (FDA approved for NASAL specimens only), is one component of a comprehensive MRSA colonization surveillance program. It is not intended to diagnose MRSA infection nor to guide or monitor treatment for MRSA infections.    Culture, bal-quantitative     Status: None   Collection Time: 05/10/2015  9:28 AM  Result Value Ref Range Status   Specimen Description BRONCHIAL ALVEOLAR LAVAGE  Final   Special Requests Immunocompromised  Final   Gram Stain   Final    EXCELLENT SPECIMEN - 90-100% WBCS MANY WBC SEEN MANY GRAM POSITIVE COCCI IN PAIRS    Culture HEAVY GROWTH STREPTOCOCCUS PNEUMONIAE  Final   Report Status 05/25/2015 FINAL  Final   Organism ID, Bacteria STREPTOCOCCUS PNEUMONIAE  Final      Susceptibility   Streptococcus pneumoniae - MIC*    VANCOMYCIN Value in next row Sensitive      SENSITIVE0.25    PENICILLIN Value in next row Sensitive      SENSITIVE1    CEFTRIAXONE Value in next row Sensitive      SENSITIVE0.5    LEVOFLOXACIN Value in next row Sensitive      SENSITIVE1    * HEAVY GROWTH STREPTOCOCCUS PNEUMONIAE  Culture, blood (routine x 2)     Status: None (Preliminary result)   Collection Time: 05/22/2015  2:52 PM  Result Value Ref Range Status   Specimen Description BLOOD LEFT ASSIST CONTROL  Final   Special Requests BAA 8CC AER 8CC  Final   Culture NO GROWTH 4 DAYS  Final   Report Status PENDING  Incomplete  Culture, blood (routine x 2)     Status: None (Preliminary result)   Collection Time: 04/25/2015  3:44 PM  Result Value Ref Range Status   Specimen Description BLOOD LEFT ASSIST CONTROL  Final   Special Requests NONE  Final   Culture NO GROWTH 4 DAYS  Final   Report Status PENDING  Incomplete    Coagulation Studies: No results for input(s): LABPROT, INR in the last 72 hours.  Urinalysis: No results for input(s): COLORURINE, LABSPEC, PHURINE, GLUCOSEU, HGBUR, BILIRUBINUR, KETONESUR, PROTEINUR, UROBILINOGEN, NITRITE, LEUKOCYTESUR in the last 72 hours.  Invalid input(s): APPERANCEUR    Imaging: No results found.   Medications:     . amiodarone  400 mg Oral BID  . aspirin EC  81 mg Oral Daily  . atorvastatin  20 mg Oral QHS  . azithromycin  500 mg Intravenous Q24H  .  carvedilol  12.5 mg Oral BID WC  . fluticasone  2 spray Each Nare Daily  . furosemide  80 mg Intravenous BID  . hydrALAZINE  25 mg Oral 3 times per day  . insulin aspart  2-6  Units Subcutaneous 6 times per day  . lactulose  30 g Oral TID  . methylPREDNISolone (SOLU-MEDROL) injection  60 mg Intravenous Daily  . pantoprazole (PROTONIX) IV  40 mg Intravenous Q24H  . rifaximin  550 mg Oral BID  . risperiDONE  0.5 mg Oral BID  . senna-docusate  1 tablet Per Tube BID   sodium chloride, sodium chloride, albuterol, alteplase, anticoagulant sodium citrate, hydrALAZINE, HYDROmorphone, HYDROmorphone, ipratropium-albuterol, ketorolac, lidocaine (PF), lidocaine-prilocaine, metoprolol, nitroGLYCERIN, pentafluoroprop-tetrafluoroeth  Assessment/ Plan:  [name] is a 54 y.o.  white male with hepatits B, hypertension, COPD, anemia, thrombocytopenia, morbid obesity, tobacco abuse.  1. 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 Creatinine baseline of 3.5 from 9/16.  Acute renal failure due to acute exacerbation of diastolic heart failure. - Renal function remains poor, pt also with  massive volume overload.   - started on HD. 1st treatment was on 9/25   -  hold gabapentin - may be causing somnolence. May restart after mental status normalized , use renally adjusted dose of 300 mg po qhs - start 24 hr urine Cr Cl next week, and if low, will need permcath this week  2. Acute Pulmonary edema and Acute diastolic heart failure:   - iv lasix 80 BID  3.  Massive generalized edema R60.1:  ,   - improved some with dialysis   LOS: 9 Carrol Bondar 10/2/201610:52 AM

## 2015-05-26 NOTE — Progress Notes (Signed)
[facility] [place] at [facility]   PATIENT NAME: Gary Juarez    MR#:  [id number]  DATE OF BIRTH:  03/25/1961  SUBJECTIVE:  CHIEF COMPLAINT:   Chief Complaint  Patient presents with  . Shortness of Breath  .  Extubated y-day, feels relatively good REVIEW OF SYSTEMS:  Feels good today except of mild discomfort in muscles in the back since and not able to ambulate with physical therapy due to hemodialysis catheter in the right groin. Overall feels good. Denies any significant shortness of breath or pains, of high flow nasal cannulas. Now just on 2 L of oxygen through nasal cannula and O2 sats of 94% on 2 L of oxygen  DRUG ALLERGIES:   No Active Allergies  VITALS:  Blood pressure 141/81, pulse 81, temperature 98.4 F (36.9 C), temperature source Oral, resp. rate 18, height  (1.702 m), weight 165.472 kg (364 lb 12.8 oz), SpO2 94 %.  PHYSICAL EXAMINATION:  GENERAL:  54 y.o.-year-old patient lying in the bed with no acute distress. Morbidly obese. Comfortable, still some confused, talkative , no significant dyspnea EYES: Pupils equal, round, reactive to light and accommodation. No scleral icterus. Extraocular muscles intact.  HEENT: Head atraumatic, normocephalic. Oropharynx and nasopharynx clear. Moist oral mucosa. NECK:  Supple, no jugular venous distention. No thyroid enlargement, no tenderness.  LUNGS: Diminished breath sounds bilaterally at bases, no wheezing, mild bilateral crackles and rhonchi. Not using accessory muscles of respiration, unless on exertion.  CARDIOVASCULAR: S1, S2 normal. Systolic murmur was heard precordially,  no rubs, or gallops.  ABDOMEN: Soft, nontender, but distended. Bowel sounds present. Abdominal wall trace edema. EXTREMITIES: Bilateral lower extremity edema 2+, no cyanosis, or clubbing. No swelling in left lower extremity but no calve tenderness, or cyanosis NEUROLOGIC: Off vent and sedation,  muscle strength 5 over out  of 5 in 4 extremities. PSYCHIATRIC: Of vent and sedation, much more relaxed and comfortable today . SKIN: No obvious rash, lesion, or ulcer.    LABORATORY PANEL:   CBC  Recent Labs Lab 05/26/15 0414  WBC 14.2*  HGB 9.7*  HCT 30.3*  PLT 61*   ------------------------------------------------------------------------------------------------------------------  Chemistries   Recent Labs Lab 05/09/2015 0517  05/23/15 0540  05/26/15 0414  NA 137  < > 140  < > 137  K 4.8  < > 4.1  < > 4.0  CL 103  < > 101  < > 97*  CO2 26  < > 30  < > 27  GLUCOSE 136*  < > 144*  < > 195*  BUN 78*  < > 70*  < > 76*  CREATININE 4.39*  < > 3.48*  < > 3.55*  CALCIUM 8.4*  < > 8.4*  < > 8.4*  MG 2.5*  --   --   --   --   AST  --   --  16  --   --   ALT  --   --  13*  --   --   ALKPHOS  --   --  50  --   --   BILITOT  --   --  1.5*  --   --   < > = values in this interval not displayed. ------------------------------------------------------------------------------------------------------------------  Cardiac Enzymes  Recent Labs Lab 05/22/15 2252  TROPONINI 0.10*   ------------------------------------------------------------------------------------------------------------------  RADIOLOGY:  No results found.  EKG:   Orders placed or performed during the hospital encounter of 2015-06-09  . ED EKG  .  ED EKG  . EKG 12-Lead  . EKG 12-Lead  . EKG 12-Lead  . EKG 12-Lead  . EKG 12-Lead  . EKG 12-Lead    ASSESSMENT AND PLAN:   1. Acute on chronic respiratory failure with hypoxia and hypercapenia. Intubated 26th of September 2016. Extubated 30th of September July 16 . Now off high flow oxygen, on 2 L of oxygen through nasal cannulas , oxygenation is satisfactory. Patient may need CPAP at nighttime, but refuses to use it at home due to his beard.   2. COPD exacerbation. Continue IV Solu-Medrol,  Zosyn, Zithromax to complete 5 day course , off vancomycin .  Continue budesonide nebulizer,  Spiriva and DuoNeb nebulizers. Clinically improved  3. Acute on chronic diastolic congestive heart failure. Now on intermittent hemodialysis via a temporary catheter in the right groin. Most recent first of October 2016 by Dr. Thedore Mins recommendations , continue Lasix 80 mg IV twice a day, urine output is 1130 cc in 24 hours, which has somewhat improved. 24 hour urine creatinine collection will be done upon patient's stabilization, may need permanent hemodialysis catheter. Discussed this patient today, explaining to him and he is agreeable  4. Bradycardia due to Coreg, which was placed on hold. Bradycardia resolved. Now patient is back on Coreg. Heart rate remains stable in 80s to 90s  5. Hypotension,  hydralazine and norvasc were on hold secondary to relative hypotension. Now Blood pressure is high, resumed hydralazine and norvasc. Resumed coreg. Blood pressure is relatively well controlled  6 New onset Afib. Of amiodarone drip, now on amiodarone orally and of digoxin, continue Coreg at 12.5 mg bid, f/u  Dr. Welton Flakes. Dr. Welton Flakes recommends cardiac catheterization in tertiary care center due to patient's weight, could be done as outpatient  7. 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 Started hemodialysis about 6 days ago and continue hemodialysis per Dr. Thedore Mins intermittently, most recent session October 1, patient may need permanent hemodialysis catheter upon discharge. Pending: Urine creatinine clearance.  8. Morbid obesity and likely sleep apnea, uses oxygen at home only. No CPAP  9. History of hepatitis B and liver disease- continue lactulose, ammonia level was high, add Xifaxan  10. Chronic back pain on chronic pain medications. Would benefit from physical therapy but is bed confined at present due to temporary hemodialysis catheter in the right groin.   11. Hyperkalemia. Hold spironolactone, improved after hemodialysis. Follow up BMP.  12. Chronic  thrombocytopenia. Stable, improving.   13. Altered mental status due to acute toxic metabolic encephalopathy,  due to respiratory failure/renal failure/med. Of sedation. Holding gabapentin. Fall and aspiration precaution.   All the records are reviewed and case discussed with Care Management/Social Workerr. I discussed with the patient's father about the patient's critical condition and Management plans, and he is in agreement.  CODE STATUS: Full code  TOTAL TAKING CARE OF THIS PATIENT: 40 minutes.   POSSIBLE D/C IN >3 DAYS, DEPENDING ON CLINICAL CONDITION.   Katharina Caper M.D on 05/26/2015 at 10:35 AM  Between 7am to 6pm - Pager - [phone]  After 6pm go to [email or web address] - password [email or web address] [email or web address]  [facility]  Office  [phone]  CC: Primary care physician; Verlee Monte, PA-C

## 2015-05-26 NOTE — Progress Notes (Signed)
Entered pt's room to find him tearful and upset. Complained of feeling depressed and tired. Worried about where he will live once he is discharged because his father would like to sell the home the pt is living in. Pt states he has friends who are supportive but they cannot support him financially. Tried consoling pt and reassured him that he could speak to a case manager in the morning to get connected with resources in his community for help. Called Dr. Renae Gloss and got a 1 time order for Xanax to help calm pt down and relax. Will continue to monitor and provide emotional support

## 2015-05-26 NOTE — Progress Notes (Signed)
Bipap on standby in pt's room.

## 2015-05-26 NOTE — Progress Notes (Signed)
Sterile dressing change to rt IJ.  OOB to bedside commode had medium formed BM.  Assisted to recliner to sit up for a while.  Tolerated well.

## 2015-05-27 ENCOUNTER — Inpatient Hospital Stay: Payer: Self-pay

## 2015-05-27 ENCOUNTER — Encounter: Admission: EM | Disposition: E | Payer: Self-pay | Source: Home / Self Care | Attending: Internal Medicine

## 2015-05-27 ENCOUNTER — Encounter: Payer: Self-pay | Admitting: Surgery

## 2015-05-27 DIAGNOSIS — E669 Obesity, unspecified: Secondary | ICD-10-CM

## 2015-05-27 DIAGNOSIS — K7290 Hepatic failure, unspecified without coma: Secondary | ICD-10-CM

## 2015-05-27 DIAGNOSIS — R198 Other specified symptoms and signs involving the digestive system and abdomen: Secondary | ICD-10-CM

## 2015-05-27 DIAGNOSIS — J9690 Respiratory failure, unspecified, unspecified whether with hypoxia or hypercapnia: Secondary | ICD-10-CM

## 2015-05-27 DIAGNOSIS — M549 Dorsalgia, unspecified: Secondary | ICD-10-CM

## 2015-05-27 DIAGNOSIS — R001 Bradycardia, unspecified: Secondary | ICD-10-CM

## 2015-05-27 DIAGNOSIS — R69 Illness, unspecified: Secondary | ICD-10-CM

## 2015-05-27 DIAGNOSIS — G4733 Obstructive sleep apnea (adult) (pediatric): Secondary | ICD-10-CM

## 2015-05-27 DIAGNOSIS — E875 Hyperkalemia: Secondary | ICD-10-CM

## 2015-05-27 DIAGNOSIS — K7682 Hepatic encephalopathy: Secondary | ICD-10-CM

## 2015-05-27 DIAGNOSIS — G8929 Other chronic pain: Secondary | ICD-10-CM

## 2015-05-27 DIAGNOSIS — I4891 Unspecified atrial fibrillation: Secondary | ICD-10-CM

## 2015-05-27 DIAGNOSIS — D696 Thrombocytopenia, unspecified: Secondary | ICD-10-CM

## 2015-05-27 DIAGNOSIS — G92 Toxic encephalopathy: Secondary | ICD-10-CM

## 2015-05-27 DIAGNOSIS — G928 Other toxic encephalopathy: Secondary | ICD-10-CM

## 2015-05-27 DIAGNOSIS — J9622 Acute and chronic respiratory failure with hypercapnia: Secondary | ICD-10-CM

## 2015-05-27 DIAGNOSIS — I959 Hypotension, unspecified: Secondary | ICD-10-CM

## 2015-05-27 LAB — LACTIC ACID, PLASMA: LACTIC ACID, VENOUS: 2.2 mmol/L — AB (ref 0.5–2.0)

## 2015-05-27 LAB — RENAL FUNCTION PANEL
ANION GAP: 13 (ref 5–15)
Albumin: 2.8 g/dL — ABNORMAL LOW (ref 3.5–5.0)
BUN: 92 mg/dL — ABNORMAL HIGH (ref 6–20)
CALCIUM: 8.9 mg/dL (ref 8.9–10.3)
CHLORIDE: 98 mmol/L — AB (ref 101–111)
CO2: 26 mmol/L (ref 22–32)
CREATININE: 4.82 mg/dL — AB (ref 0.61–1.24)
GFR, EST AFRICAN AMERICAN: 14 mL/min — AB (ref >60)
GFR, EST NON AFRICAN AMERICAN: 12 mL/min — AB (ref >60)
Glucose, Bld: 100 mg/dL — ABNORMAL HIGH (ref 65–99)
Phosphorus: 7.3 mg/dL — ABNORMAL HIGH (ref 2.5–4.6)
Potassium: 4.9 mmol/L (ref 3.5–5.1)
Sodium: 137 mmol/L (ref 135–145)

## 2015-05-27 LAB — GLUCOSE, CAPILLARY
GLUCOSE-CAPILLARY: 95 mg/dL (ref 65–99)
Glucose-Capillary: 57 mg/dL — ABNORMAL LOW (ref 65–99)

## 2015-05-27 LAB — TYPE AND SCREEN
ABO/RH(D): A POS
ANTIBODY SCREEN: NEGATIVE

## 2015-05-27 LAB — PROTIME-INR
INR: 1.43
PROTHROMBIN TIME: 17.6 s — AB (ref 11.4–15.0)

## 2015-05-27 SURGERY — LAPAROTOMY, EXPLORATORY
Anesthesia: General

## 2015-05-27 MED ORDER — MORPHINE SULFATE (PF) 2 MG/ML IV SOLN: 2.0000 mg | Freq: Once | INTRAVENOUS | Status: DC

## 2015-05-27 MED ORDER — MEROPENEM 1 G IV SOLR
1.0000 g | Freq: Two times a day (BID) | INTRAVENOUS | Status: DC
  Administered 2015-05-27: 1 g via INTRAVENOUS
  Filled 2015-05-27 (×3): qty 1

## 2015-05-27 MED ORDER — MORPHINE SULFATE (PF) 2 MG/ML IV SOLN: 2.0000 mg | Freq: Once | INTRAVENOUS | Status: DC | PRN

## 2015-05-27 MED ORDER — DEXTROSE 50 % IV SOLN
INTRAVENOUS | Status: AC
  Administered 2015-05-27: 25 mL
  Filled 2015-05-27: qty 50

## 2015-05-27 SURGICAL SUPPLY — 27 items
CANISTER SUCT 1200ML W/VALVE (MISCELLANEOUS) IMPLANT
CANISTER SUCT 3000ML (MISCELLANEOUS) IMPLANT
CATH TRAY 16F METER LATEX (MISCELLANEOUS) IMPLANT
CHLORAPREP W/TINT 26ML (MISCELLANEOUS) IMPLANT
DRAPE LAPAROTOMY 100X77 ABD (DRAPES) IMPLANT
DRAPE SHEET LG 3/4 BI-LAMINATE (DRAPES) IMPLANT
DRAPE UTILITY 15X26 TOWEL STRL (DRAPES) IMPLANT
ELECT CAUTERY BLADE 6.4 (BLADE) IMPLANT
GAUZE SPONGE 4X4 12PLY STRL (GAUZE/BANDAGES/DRESSINGS) IMPLANT
GLOVE BIO SURGEON STRL SZ7.5 (GLOVE) IMPLANT
GOWN STRL REUS W/ TWL LRG LVL3 (GOWN DISPOSABLE) IMPLANT
GOWN STRL REUS W/ TWL XL LVL3 (GOWN DISPOSABLE) IMPLANT
GOWN STRL REUS W/TWL LRG LVL3 (GOWN DISPOSABLE)
GOWN STRL REUS W/TWL XL LVL3 (GOWN DISPOSABLE)
KIT RM TURNOVER STRD PROC AR (KITS) IMPLANT
LABEL OR SOLS (LABEL) IMPLANT
LIGASURE 5MM LAPAROSCOPIC (INSTRUMENTS) IMPLANT
NS IRRIG 1000ML POUR BTL (IV SOLUTION) IMPLANT
PACK BASIN MAJOR ARMC (MISCELLANEOUS) IMPLANT
PAD ABD DERMACEA PRESS 5X9 (GAUZE/BANDAGES/DRESSINGS) IMPLANT
PAD GROUND ADULT SPLIT (MISCELLANEOUS) IMPLANT
STAPLER SKIN PROX 35W (STAPLE) IMPLANT
SUT CHROMIC 0 CT 1 (SUTURE) IMPLANT
SUT CHROMIC 3 0 SH 27 (SUTURE) IMPLANT
SUT TICRON 2-0 30IN 311381 (SUTURE) IMPLANT
SUT VIC AB 1 CTX 27 (SUTURE) IMPLANT
SUT VICRYL PLUS ABS 0 54 (SUTURE) IMPLANT

## 2015-05-28 LAB — GLUCOSE, CAPILLARY: GLUCOSE-CAPILLARY: 109 mg/dL — AB (ref 65–99)

## 2015-06-25 NOTE — Progress Notes (Signed)
Overnight oximetry stopped due to pt O2 sats staying around 78%. Pt placed on Bipap. Tolerating well. Denton Ar, & Crystal RN's all in room with pt and communicating with Dr.

## 2015-06-25 NOTE — Consult Note (Signed)
Patient ID: Gary Juarez, male   DOB: 1961-06-21, 54 y.o.   MRN: [id number]  Chief Complaint  Patient presents with  . Shortness of Breath    HPI  Gary Juarez is a 54 y.o. male.  with severe COPD admitted to the hospital on 2 occasions in the last 30 days with COPD exacerbations hypercarbia. Patient is been placed on dialysis for renal insufficiency. He has a history of congestive heart failure as well as hepatitis B and chronic back pain. He also has a history of acute on chronic renal failure for which he was recently placed on hemodialysis on the 28th of last month. The patient was recuperating in his room on nasal cannula when he developed a sudden onset of abdominal pain plain films demonstrating a massive amount of free air. Of note the patient was made recently a DO NOT RESUSCITATE with the assistance of palliative care medicine review of the notes dated , the 29th of this year demonstrated the patient desiring to be made a DO NOT RESUSCITATE with the expressed desire to not be reintubated. Currently the patient's only living relative is his father who is currently not his healthcare power of attorney (HCPOA).  I spoke with the father by telephone and he confirms all of this. The patient was found to be moaning in pain screaming out. Upon my examination the patient desired not to undergo operative intervention however it is unclear whether he is in a state of mind to clearly make this decision. He has been treated with intravenous high-dose sterilized as well as antibiotics for strep pneumonia. He is a former smoker but denies alcohol use.  In addition the patient has been on lactulose for elevated ammonia levels and there is signs of clinical liver insufficiency in addition. Pro time on the 28th was slightly elevated at 17.6 secs.   Past Medical History  Diagnosis Date  . Hypertension   . COPD (chronic obstructive pulmonary disease) ([place])   . Chronic kidney disease   . Chronic back  pain greater than 3 months duration   . Phlebitis 1992    left calf  . Anxiety and depression   . CHF (congestive heart failure) ([place])   . Hepatitis B     Past Surgical History  Procedure Laterality Date  . Wisdom tooth extraction      Family History  Problem Relation Age of Onset  . Stroke Mother   . Cancer Mother     Breast    Social History Social History  Substance Use Topics  . Smoking status: Former Smoker -- 0.25 packs/day for 20 years    Types: Cigarettes    Quit date: 12/13/2014  . Smokeless tobacco: Never Used  . Alcohol Use: No    NKDA  Current Facility-Administered Medications  Medication Dose Route Frequency Provider Last Rate Last Dose  . 0.9 %  sodium chloride infusion  100 mL Intravenous PRN Munsoor Lateef, MD      . 0.9 %  sodium chloride infusion  100 mL Intravenous PRN Munsoor Lateef, MD      . albuterol (PROVENTIL) (2.5 MG/3ML) 0.083% nebulizer solution 2.5 mg  2.5 mg Nebulization Q6H PRN Loletha Grayer, MD      . alteplase (CATHFLO ACTIVASE) injection 2 mg  2 mg Intracatheter Once PRN Munsoor Lateef, MD      . amiodarone (PACERONE) tablet 400 mg  400 mg Oral BID Theodoro Grist, MD   400 mg at 05/26/15 2258  . anticoagulant  sodium citrate solution 5 mL  5 mL Intravenous Daily PRN Murlean Iba, MD      . aspirin EC tablet 81 mg  81 mg Oral Daily Loletha Grayer, MD   81 mg at 05/26/15 9767  . atorvastatin (LIPITOR) tablet 20 mg  20 mg Oral QHS Loletha Grayer, MD   20 mg at 05/26/15 2258  . azithromycin (ZITHROMAX) 500 mg in dextrose 5 % 250 mL IVPB  500 mg Intravenous Q24H Laverle Hobby, MD   500 mg at 05/26/15 1118  . carvedilol (COREG) tablet 12.5 mg  12.5 mg Oral BID WC Barnabas Harries, PA-C   12.5 mg at 05/26/15 0829  . fluticasone (FLONASE) 50 MCG/ACT nasal spray 2 spray  2 spray Each Nare Daily Demetrios Loll, MD   2 spray at 05/26/15 3419  . furosemide (LASIX) injection 80 mg  80 mg Intravenous BID Loletha Grayer, MD   80 mg at 05/26/15 1711   . hydrALAZINE (APRESOLINE) injection 10 mg  10 mg Intravenous Q6H PRN Vishal Mungal, MD      . hydrALAZINE (APRESOLINE) tablet 25 mg  25 mg Oral 3 times per day Demetrios Loll, MD   25 mg at 05/26/15 2257  . HYDROmorphone (DILAUDID) tablet 1 mg  1 mg Oral Q6H PRN Colleen Can, MD      . HYDROmorphone (DILAUDID) tablet 2 mg  2 mg Oral Q6H PRN Colleen Can, MD   2 mg at 05/26/15 2258  . insulin aspart (novoLOG) injection 2-6 Units  2-6 Units Subcutaneous 6 times per day Elsie Stain, MD   2 Units at 05/26/15 2009  . ipratropium-albuterol (DUONEB) 0.5-2.5 (3) MG/3ML nebulizer solution 3 mL  3 mL Nebulization Q4H PRN Elsie Stain, MD      . ketorolac (TORADOL) 30 MG/ML injection 30 mg  30 mg Intravenous Q6H PRN Demetrios Loll, MD   30 mg at 05/26/15 2002  . lactulose (CHRONULAC) 10 GM/15ML solution 30 g  30 g Oral TID Vilinda Boehringer, MD   30 g at 05/26/15 2259  . lidocaine (PF) (XYLOCAINE) 1 % injection 5 mL  5 mL Intradermal PRN Munsoor Lateef, MD      . lidocaine-prilocaine (EMLA) cream 1 application  1 application Topical PRN Munsoor Lateef, MD      . meropenem (MERREM) 1 g in sodium chloride 0.9 % 100 mL IVPB  1 g Intravenous Q12H Lance Coon, MD      . methylPREDNISolone sodium succinate (SOLU-MEDROL) 125 mg/2 mL injection 60 mg  60 mg Intravenous Daily Demetrios Loll, MD   60 mg at 05/26/15 3790  . metoprolol (LOPRESSOR) injection 5 mg  5 mg Intravenous Q6H PRN Vishal Mungal, MD      . morphine 2 MG/ML injection 2 mg  2 mg Intravenous Once PRN Juluis Mire, MD      . nitroGLYCERIN (NITROSTAT) SL tablet 0.4 mg  0.4 mg Sublingual Q5 min PRN Loletha Grayer, MD      . pantoprazole (PROTONIX) injection 40 mg  40 mg Intravenous Q24H Elsie Stain, MD   40 mg at 05/26/15 2259  . pentafluoroprop-tetrafluoroeth (GEBAUERS) aerosol 1 application  1 application Topical PRN Munsoor Lateef, MD      . rifaximin (XIFAXAN) tablet 550 mg  550 mg Oral BID Theodoro Grist, MD   550 mg at 05/26/15 2258    . risperiDONE (RISPERDAL M-TABS) disintegrating tablet 0.5 mg  0.5 mg Oral BID Theodoro Grist, MD   0.5 mg at  05/26/15 2258  . senna-docusate (Senokot-S) tablet 1 tablet  1 tablet Per Tube BID Vilinda Boehringer, MD   1 tablet at 05/26/15 2257    Blood pressure 148/69, pulse 90, temperature 98.2 F (36.8 C), temperature source Oral, resp. rate 20, height 5' 7"  (1.702 m), weight 364 lb 12.8 oz (165.472 kg), SpO2 99 %.  Results for orders placed or performed during the hospital encounter of 04/30/2015 (from the past 48 hour(s))  Glucose, capillary     Status: Abnormal   Collection Time: 05/25/15  7:16 AM  Result Value Ref Range   Glucose-Capillary 119 (H) 65 - 99 mg/dL  Ammonia     Status: Abnormal   Collection Time: 05/25/15 10:40 AM  Result Value Ref Range   Ammonia 94 (H) 9 - 35 umol/L  Glucose, capillary     Status: Abnormal   Collection Time: 05/25/15 11:24 AM  Result Value Ref Range   Glucose-Capillary 132 (H) 65 - 99 mg/dL  Glucose, capillary     Status: Abnormal   Collection Time: 05/25/15  4:23 PM  Result Value Ref Range   Glucose-Capillary 228 (H) 65 - 99 mg/dL  Glucose, capillary     Status: Abnormal   Collection Time: 05/25/15  7:41 PM  Result Value Ref Range   Glucose-Capillary 151 (H) 65 - 99 mg/dL  Renal function panel     Status: Abnormal   Collection Time: 05/25/15 10:33 PM  Result Value Ref Range   Sodium 136 135 - 145 mmol/L   Potassium 3.6 3.5 - 5.1 mmol/L   Chloride 97 (L) 101 - 111 mmol/L   CO2 30 22 - 32 mmol/L   Glucose, Bld 149 (H) 65 - 99 mg/dL   BUN 71 (H) 6 - 20 mg/dL   Creatinine, Ser 3.16 (H) 0.61 - 1.24 mg/dL   Calcium 8.1 (L) 8.9 - 10.3 mg/dL   Phosphorus 4.3 2.5 - 4.6 mg/dL   Albumin 2.8 (L) 3.5 - 5.0 g/dL   GFR calc non Af Amer 21 (L) >60 mL/min   GFR calc Af Amer 24 (L) >60 mL/min    Comment: (NOTE) The eGFR has been calculated using the CKD EPI equation. This calculation has not been validated in all clinical situations. eGFR's persistently  <60 mL/min signify possible Chronic Kidney Disease.    Anion gap 9 5 - 15  CBC     Status: Abnormal   Collection Time: 05/25/15 10:33 PM  Result Value Ref Range   WBC 12.4 (H) 3.8 - 10.6 K/uL   RBC 3.67 (L) 4.40 - 5.90 MIL/uL   Hemoglobin 9.7 (L) 13.0 - 18.0 g/dL   HCT 30.5 (L) 40.0 - 52.0 %   MCV 83.1 80.0 - 100.0 fL   MCH 26.4 26.0 - 34.0 pg   MCHC 31.8 (L) 32.0 - 36.0 g/dL   RDW 19.9 (H) 11.5 - 14.5 %   Platelets 52 (L) 150 - 440 K/uL  Glucose, capillary     Status: Abnormal   Collection Time: 05/26/15 12:06 AM  Result Value Ref Range   Glucose-Capillary 145 (H) 65 - 99 mg/dL  Glucose, capillary     Status: Abnormal   Collection Time: 05/26/15  4:12 AM  Result Value Ref Range   Glucose-Capillary 197 (H) 65 - 99 mg/dL   Comment 1 Notify RN   CBC     Status: Abnormal   Collection Time: 05/26/15  4:14 AM  Result Value Ref Range   WBC 14.2 (H) 3.8 - 10.6  K/uL   RBC 3.66 (L) 4.40 - 5.90 MIL/uL   Hemoglobin 9.7 (L) 13.0 - 18.0 g/dL   HCT 30.3 (L) 40.0 - 52.0 %   MCV 82.7 80.0 - 100.0 fL   MCH 26.6 26.0 - 34.0 pg   MCHC 32.2 32.0 - 36.0 g/dL   RDW 20.3 (H) 11.5 - 14.5 %   Platelets 61 (L) 150 - 440 K/uL  Basic metabolic panel     Status: Abnormal   Collection Time: 05/26/15  4:14 AM  Result Value Ref Range   Sodium 137 135 - 145 mmol/L   Potassium 4.0 3.5 - 5.1 mmol/L   Chloride 97 (L) 101 - 111 mmol/L   CO2 27 22 - 32 mmol/L   Glucose, Bld 195 (H) 65 - 99 mg/dL   BUN 76 (H) 6 - 20 mg/dL   Creatinine, Ser 3.55 (H) 0.61 - 1.24 mg/dL   Calcium 8.4 (L) 8.9 - 10.3 mg/dL   GFR calc non Af Amer 18 (L) >60 mL/min   GFR calc Af Amer 21 (L) >60 mL/min    Comment: (NOTE) The eGFR has been calculated using the CKD EPI equation. This calculation has not been validated in all clinical situations. eGFR's persistently <60 mL/min signify possible Chronic Kidney Disease.    Anion gap 13 5 - 15  Glucose, capillary     Status: Abnormal   Collection Time: 05/26/15 11:30 AM  Result  Value Ref Range   Glucose-Capillary 171 (H) 65 - 99 mg/dL  Glucose, capillary     Status: Abnormal   Collection Time: 05/26/15  4:34 PM  Result Value Ref Range   Glucose-Capillary 156 (H) 65 - 99 mg/dL   Comment 1 Notify RN    Comment 2 Document in Chart   Glucose, capillary     Status: Abnormal   Collection Time: 05/26/15  7:43 PM  Result Value Ref Range   Glucose-Capillary 135 (H) 65 - 99 mg/dL   Comment 1 Notify RN   Glucose, capillary     Status: Abnormal   Collection Time: 05/26/15 11:04 PM  Result Value Ref Range   Glucose-Capillary 107 (H) 65 - 99 mg/dL   Comment 1 Notify RN   Lactic acid, plasma     Status: Abnormal   Collection Time: 06-24-2015  2:37 AM  Result Value Ref Range   Lactic Acid, Venous 2.2 (HH) 0.5 - 2.0 mmol/L    Comment: CRITICAL RESULT CALLED TO, READ BACK BY AND VERIFIED WITH ADRIENNE WHITE AT 0303 ON 06-24-2015 R[facility]    Dg Abd Acute W/chest  06-24-2015   CLINICAL DATA:  Abdominal pain and distention.  EXAM: DG ABDOMEN ACUTE W/ 1V CHEST  COMPARISON:  None.  FINDINGS: There is a large volume free intraperitoneal air. There is moderate nonspecific gaseous distention of the colon. No pneumatosis is evident. The lungs are grossly clear.  IMPRESSION: Large volume free intraperitoneal air. Critical Value/emergent results were called by telephone at the time of interpretation on 06-24-15 at 2:36 am to nurse Vincente Liberty, who verbally acknowledged these results.   Electronically Signed   By: Andreas Newport M.D.   On: 2015-06-24 02:38    Review of Systems  Unable to perform ROS: critical illness    Physical Exam  Constitutional: He appears distressed.  He is on BiPAP device.  HENT:  Head: Normocephalic and atraumatic.  Eyes: Pupils are equal, round, and reactive to light.  Cardiovascular: Normal rate.   Pulmonary/Chest: He is in respiratory distress. He  has wheezes.  Abdominal: Soft. He exhibits distension. He exhibits no ascites and no mass. There is generalized  tenderness and tenderness in the epigastric area and periumbilical area. There is rebound and guarding.    Neurological:  The patient is moaning in pain. Appears confused. Unclear whether he understands questions.  Skin: Skin is warm.     Data Reviewed I reviewed hospital notes and laboratory values from the previous admission as well as the current admission.  I have personally reviewed the patient's imaging, laboratory findings and medical records.    Assessment    54 year old white male with severe COPD and renal insufficiency on intermittent dialysis presents with the sudden onset of abdominal pain and now with signs of a perforated viscus with free air seen on plain abdominal radiographs. I suspect that this is a perforated gastroduodenal ulcer given history of steroid-induced and severe COPD.   The patient is at extremely high risk for any type of operative intervention due to his significant respiratory insufficiency, thrombocytopenia and renal insufficiency, and probable early cirrhosis based on ammonia levels thrombocytopenia and elevated pro- time.  I believe that this is a terminal event for the patient.  Currently he is a DO NOT RESUSCITATE and wishes not to be intubated again based on conversations ahead with both the patient's other by telephone as well as the nose from palliative care medicine dated several days ago. I spoke with the patient's father by telephone and discussed the situation and the grave nature of the problem with him and the patient's father seems to understand.  He was unable to tell me whether the patient would want operative intervention. At present due to the operative schedule an unable to take him to the operating room. In the meantime the patient will be transferred to the intensive care unit and broad-spectrum intravenous anabiotic's will be initiated and we will reassess the patient's family desire for operative intervention in the next several hours.       Plan    See above      Time spent with patient was 70 minutes, with more than 50% of the time spent counseling and coordinating care of patient.    I spoke with Dr. Jannifer Franklin by telephone regarding the patient's care and plan.  Sherri Rad MD, FACS 05-28-2015, 4:10 AM

## 2015-06-25 NOTE — Progress Notes (Signed)
X-ray results relayed to me. Prime Doc MD notified of results. Went to recheck patient. O2 level low in highs 70s. O2 level changed to 4L 02. Rechecked level. Still in highs 70s. Respiratory notified. Changed to Bipap. MD notified. Surgeon contacted. Awaiting further instructions Will continue to monitor.

## 2015-06-25 NOTE — Progress Notes (Signed)
Hypoglycemic Event  CBG: 56  Treatment: 25 ml dextrose  Symptoms: CBG was 56  Follow-up CBG: Time:0429 CBG Result: 95  Possible Reasons for Event: NPO  Comments/MD notified:     Salli Quarry  Remember to initiate Hypoglycemia Order Set & complete

## 2015-06-25 NOTE — Progress Notes (Addendum)
Called by nursing for acute abd pain.  Xray abd shows intraperitoneal free air, suspect possible perforation.  Called surgery to evaluate.   Update - Dr Egbert Garibaldi from surgery saw patient and tried to contact family for consent to surgery.  On his evaluation patient stated he did not want surgery, however, patient is no completely oriented and is requiring BiPAP again.  Will transfer to ICU, start IV antibiotics per surgery recommendations, and continue trying to reach family to determine if patient/family will consent to surgery or not.  There seems to be an ongoing code status discussion between patient and his family.  Currently listed as DNR, though that was all prior to this new development.  Kristeen Miss [facility] 06/06/2015, 3:00 AM

## 2015-06-25 NOTE — Discharge Summary (Signed)
[facility] [place] at [facility]   PATIENT NAME: Raequon Catanzaro    MR#:  [id number]  DATE OF BIRTH:  10/02/1960  DATE OF ADMISSION:  05/15/2015 ADMITTING PHYSICIAN: Alford Highland, MD  DATE OF DISCHARGE: 2015/06/09  8:07 AM  PRIMARY CARE PHYSICIAN: Verlee Monte, PA-C     ADMISSION DIAGNOSIS:  Shortness of breath [R06.02] Hypoxia [R09.02] Acute on chronic congestive heart failure, unspecified congestive heart failure type (HCC) [I50.9]  DISCHARGE DIAGNOSIS:  Principal Problem:   Perforated viscus Active Problems:   Acute on chronic respiratory failure with hypoxia and hypercapnia (HCC)   Acute respiratory failure with hypoxia (HCC)   Respiratory failure (HCC)   Difficult intubation   Acute on chronic diastolic CHF (congestive heart failure) (HCC)   Encephalopathy, metabolic   Acute renal failure (HCC)   Obstructive sleep apnea   Encephalopathy, hepatic (HCC)   Hyperkalemia   Thrombocytopenia (HCC)   Toxic metabolic encephalopathy   Lethargy   Bradycardia   Hypotension   Atrial fibrillation (HCC)   Obesity   Chronic back pain   SECONDARY DIAGNOSIS:   Past Medical History  Diagnosis Date  . Hypertension   . COPD (chronic obstructive pulmonary disease) (HCC)   . Chronic kidney disease   . Chronic back pain greater than 3 months duration   . Phlebitis 1992    left calf  . Anxiety and depression   . CHF (congestive heart failure) (HCC)   . Hepatitis B     .pro HOSPITAL COURSE:   Patient is a 54 year old Caucasian male with past medical history significant history of COPD, congestive heart failure and few admissions to the hospital for COPD exacerbation and CHF / shortness of breath, presents to the hospital 23rd of September 2016 with complaints of shortness of breath and weight gain. He complained of tightness and abdomen as well as chest tightness. Portable chest x-ray done 23th of September 2016 revealed severe cardiomegaly,  chronic and the airspace disease, worse on the right upper lobe. Patient was admitted to the hospital for further evaluation. Blood cultures were taken on September 23 and they showed no growth. Sputum cultures revealed heavy growth of Streptococcus pneumonia sensitive to all antibiotics. Patient's labs revealed severe kidney failure with creatinine level of 4.89, he had a temporary hemodialysis catheter placed in the right groin and was initiated on hemodialysis intermittently.  Patient, however deteriorated and was intubated on September 27 due to altered mental status and hypoxia. He was managed on broad-spectrum antibiotic therapy and his condition improved and he was extubated 30th of September. It was felt that patient's altered mental status was related to gabapentin. Post extubation, he did relatively well, although remainED intermittently somnolent. In the afternoon on 09-Jun-2015, he developed severe abdominal pain. He underwent x-ray of his abdomen which revealed free air. Consultation with Dr. Egbert Garibaldi was obtained who felt that patient is very poor candidate for surgical intervention, patient himself declined surgery, although was confused. Patient became bradycardic and expired in the evening on 06/09/2015 Discussion by problem 1. Acute on chronic respiratory failure with hypoxia and hypercapenia. Intubated 26th of September 2016. Extubated 30th of September July 16 . Initially, patient was on high flow oxygen nasal cannulas, but later over the past 24 hours, remained on 2 L of oxygen through nasal cannulas , oxygenation was satisfactory. Patient may need CPAP at nighttime, but refuseD to use it at home due to his beard.   2. COPD exacerbation. Patient was ContinueD on  IV Solu-Medrol, Zosyn, Zithromax to complete 5 day antibiotic course , most recently off vancomycin . Was continued on budesonide nebulizer, Spiriva and DuoNeb nebulizers and Clinically improved  3. Acute on chronic diastolic  congestive heart failure. Was continued on intermittent hemodialysis via a temporary catheter in the right groin. Urine output  has somewhat improved over the past 24 hours nephrologist recommended to get  24 hour urine creatinine collectionto make decisions about permanent hemodialysis  4. Bradycardia due to Coreg, which wasinitially  placed on hold. Bradycardia resolved.Then patient was resumed on  Coreg. Heart rate remainED stable in 80s to 90s yesterday. However, patient deteriorated, became bradycardic and expired  5. Hypotension, hydralazine and norvasc were on hold secondary to relative hypotension.10d pressure improved  resumed hydralazine and norvasc. Resumed coreg. Blood pressure was well controlled  6 New onset Afib.Initially managed one drip,. Which was later changed to  amiodarone orally and of digoxin, continue Coreg at 12.5 mg bid, f/u Dr. Welton Flakes. Dr. Welton Flakes recommends cardiac catheterization in tertiary care center due to patient's weight, could be done as outpatient  7. 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 Started hemodialysis about  1 week  ago and continue hemodialysis per Dr. Thedore Mins intermittently, most recent session October 1, patient may need permanent hemodialysis catheter upon discharge. Pending: Urine creatinine clearance.  8. Morbid obesity and likely sleep apnea, uses oxygen at home only. No CPAP  9. History of hepatitis B and liver disease- . Patient was continued on lactulose,yesterday added  Xifaxan  10. Chronic back pain on chronic pain medications. Would benefit from physical therapy but is bed confined at present due to temporary hemodialysis catheter in the right groin.   11. Hyperkalemia. Hold spironolactone, improved after hemodialysis. Follow up BMP.  12. Chronic thrombocytopenia. Stable,improved.   13. Altered mental status due to acute toxic metabolic encephalopathy, due to respiratory failure/renal failure/med. Of  sedation. Holding gabapentin. Fall and aspiration precaution. 14. Perforation of viscus, unclear etiology, patient deteriorated very quickly and since he is was DO NOT RESUSCITATE, He expired with no resuscitation efforts AND IN [place].   DISCHARGE CONDITIONS:     CONSULTS OBTAINED:  Treatment Team:  Lamar Blinks, MD [place] Cherylann Ratel, MD Laurier Nancy, MD Erin Fulling, MD  DRUG ALLERGIES:  No Active Allergies  DISCHARGE MEDICATIONS:   Discharge Medication List as of Jun 11, 2015  8:08 AM    CONTINUE these medications which have NOT CHANGED   Details  albuterol (PROVENTIL HFA;VENTOLIN HFA) 108 (90 BASE) MCG/ACT inhaler Inhale 2 puffs into the lungs every 6 (six) hours as needed for wheezing or shortness of breath., Until Discontinued, Historical Med    amLODipine (NORVASC) 10 MG tablet Take 1 tablet (10 mg total) by mouth daily., Starting 05/05/2015, Until Discontinued, Print    aspirin EC 81 MG tablet Take 81 mg by mouth daily., Until Discontinued, Historical Med    atorvastatin (LIPITOR) 20 MG tablet Take 20 mg by mouth at bedtime. , Until Discontinued, Historical Med    carvedilol (COREG) 12.5 MG tablet Take 12.5 mg by mouth 2 (two) times daily., Until Discontinued, Historical Med    clopidogrel (PLAVIX) 75 MG tablet Take 75 mg by mouth daily., Until Discontinued, Historical Med    Fluticasone-Salmeterol (ADVAIR DISKUS) 250-50 MCG/DOSE AEPB Inhale 1 puff into the lungs 2 (two) times daily., Starting 05/05/2015, Until Discontinued, Print    gabapentin (NEURONTIN) 300 MG capsule Take 300 mg by mouth 3 (three) times daily., Until  Discontinued, Historical Med    hydrALAZINE (APRESOLINE) 25 MG tablet Take 1 tablet (25 mg total) by mouth every 8 (eight) hours., Starting 05/05/2015, Until Discontinued, Print    HYDROcodone-acetaminophen (NORCO/VICODIN) 5-325 MG per tablet Take 1 tablet by mouth every 6 (six) hours as needed for moderate pain., Starting 05/05/2015, Until Discontinued,  Print    isosorbide mononitrate (IMDUR) 30 MG 24 hr tablet Take 30 mg by mouth daily., Until Discontinued, Historical Med    lactulose (CHRONULAC) 10 GM/15ML solution Take 45 mLs (30 g total) by mouth 2 (two) times daily., Starting 05/05/2015, Until Discontinued, Print    nitroGLYCERIN (NITROSTAT) 0.4 MG SL tablet Place 1 tablet (0.4 mg total) under the tongue every 5 (five) minutes as needed for chest pain., Starting 05/13/2015, Until Discontinued, Print    pantoprazole (PROTONIX) 40 MG tablet Take 40 mg by mouth at bedtime. , Until Discontinued, Historical Med    potassium chloride SA (K-DUR,KLOR-CON) 20 MEQ tablet Take 1 tablet (20 mEq total) by mouth 2 (two) times daily., Starting 05/13/2015, Until Discontinued, Print    spironolactone (ALDACTONE) 25 MG tablet Take 25 mg by mouth daily., Until Discontinued, Historical Med    tiotropium (SPIRIVA) 18 MCG inhalation capsule Place 18 mcg into inhaler and inhale daily., Until Discontinued, Historical Med    torsemide (DEMADEX) 20 MG tablet Take 2 tablets (40 mg total) by mouth 2 (two) times daily., Starting 05/13/2015, Until Discontinued, Print    predniSONE (STERAPRED UNI-PAK 21 TAB) 10 MG (21) TBPK tablet Take 1 tablet (10 mg total) by mouth daily. 6 tabs PO x 1 day 5 tabs PO x 1 day 4 tabs PO x 1 day 3 tabs PO x 1 day 2 tabs PO x 1 day 1 tab PO x 1 day and stop, Starting 05/05/2015, Until Discontinued, Print         DISCHARGE INSTRUCTIONS:      If you experience worsening of your admission symptoms, develop shortness of breath, life threatening emergency, suicidal or homicidal thoughts you must seek medical attention immediately by calling 911 or calling your MD immediately  if symptoms less severe.  You Must read complete instructions/literature along with all the possible adverse reactions/side effects for all the Medicines you take and that have been prescribed to you. Take any new Medicines after you have completely understood  and accept all the possible adverse reactions/side effects.   Please note  You were cared for by a hospitalist during your hospital stay. If you have any questions about your discharge medications or the care you received while you were in the hospital after you are discharged, you can call the unit and asked to speak with the hospitalist on call if the hospitalist that took care of you is not available. Once you are discharged, your primary care physician will handle any further medical issues. Please note that NO REFILLS for any discharge medications will be authorized once you are discharged, as it is imperative that you return to your primary care physician (or establish a relationship with a primary care physician if you do not have one) for your aftercare needs so that they can reassess your need for medications and monitor your lab values.    Today   CHIEF COMPLAINT:   Chief Complaint  Patient presents with  . Shortness of Breath    HISTORY OF PRESENT ILLNESS:  Teruo Stilley  is a 54 y.o. male with a known history of COPD, congestive heart failure and few admissions to the  hospital for COPD exacerbation and CHF / shortness of breath, presents to the hospital 23rd of September 2016 with complaints of shortness of breath and weight gain. He complained of tightness and abdomen as well as chest tightness. Portable chest x-ray done 23th of September 2016 revealed severe cardiomegaly, chronic and the airspace disease, worse on the right upper lobe. Patient was admitted to the hospital for further evaluation. Blood cultures were taken on September 23 and they showed no growth. Sputum cultures revealed heavy growth of Streptococcus pneumonia sensitive to all antibiotics. Patient's labs revealed severe kidney failure with creatinine level of 4.89, he had a temporary hemodialysis catheter placed in the right groin and was initiated on hemodialysis intermittently.  Patient, however deteriorated and  was intubated on September 27 due to altered mental status and hypoxia. He was managed on broad-spectrum antibiotic therapy and his condition improved and he was extubated 30th of September. It was felt that patient's altered mental status was related to gabapentin. Post extubation, he did relatively well, although remainED intermittently somnolent. In the afternoon on May 29, 2015, he developed severe abdominal pain. He underwent x-ray of his abdomen which revealed free air. Consultation with Dr. Egbert Garibaldi was obtained who felt that patient is very poor candidate for surgical intervention, patient himself declined surgery, although was confused. Patient became bradycardic and expired in the evening on 05/29/2015 Discussion by problem 1. Acute on chronic respiratory failure with hypoxia and hypercapenia. Intubated 26th of September 2016. Extubated 30th of September July 16 . Initially, patient was on high flow oxygen nasal cannulas, but later over the past 24 hours, remained on 2 L of oxygen through nasal cannulas , oxygenation was satisfactory. Patient may need CPAP at nighttime, but refuseD to use it at home due to his beard.   2. COPD exacerbation. Patient was ContinueD on IV Solu-Medrol, Zosyn, Zithromax to complete 5 day antibiotic course , most recently off vancomycin . Was continued on budesonide nebulizer, Spiriva and DuoNeb nebulizers and Clinically improved  3. Acute on chronic diastolic congestive heart failure. Was continued on intermittent hemodialysis via a temporary catheter in the right groin. Urine output  has somewhat improved over the past 24 hours nephrologist recommended to get  24 hour urine creatinine collectionto make decisions about permanent hemodialysis  4. Bradycardia due to Coreg, which wasinitially  placed on hold. Bradycardia resolved.Then patient was resumed on  Coreg. Heart rate remainED stable in 80s to 90s yesterday. However, patient deteriorated, became bradycardic and  expired  5. Hypotension, hydralazine and norvasc were on hold secondary to relative hypotension.10d pressure improved  resumed hydralazine and norvasc. Resumed coreg. Blood pressure was well controlled  6 New onset Afib.Initially managed one drip,. Which was later changed to  amiodarone orally and of digoxin, continue Coreg at 12.5 mg bid, f/u Dr. Welton Flakes. Dr. Welton Flakes recommends cardiac catheterization in tertiary care center due to patient's weight, could be done as outpatient  7. 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 Started hemodialysis about  1 week  ago and continue hemodialysis per Dr. Thedore Mins intermittently, most recent session October 1, patient may need permanent hemodialysis catheter upon discharge. Pending: Urine creatinine clearance.  8. Morbid obesity and likely sleep apnea, uses oxygen at home only. No CPAP  9. History of hepatitis B and liver disease- . Patient was continued on lactulose,yesterday added  Xifaxan  10. Chronic back pain on chronic pain medications. Would benefit from physical therapy but is bed confined at  present due to temporary hemodialysis catheter in the right groin.   11. Hyperkalemia. Hold spironolactone, improved after hemodialysis. Follow up BMP.  12. Chronic thrombocytopenia. Stable,improved.   13. Altered mental status due to acute toxic metabolic encephalopathy, due to respiratory failure/renal failure/med. Of sedation. Holding gabapentin. Fall and aspiration precaution. 14. Perforation of viscus, unclear etiology, patient deteriorated very quickly and since he is was DO NOT RESUSCITATE, He expired with no resuscitation efforts AND IN [place].      VITAL SIGNS:  Blood pressure 102/47, pulse 92, temperature 97.7 F (36.5 C), temperature source Oral, resp. rate 22, height  (1.702 m), weight 165.472 kg (364 lb 12.8 oz), SpO2 92 %.  I/O:   Intake/Output Summary (Last 24 hours) at 2015-06-01 1412 Last data  filed at 2015/06/01 0432  Gross per 24 hour  Intake      0 ml  Output    320 ml  Net   -320 ml    PHYSICAL EXAMINATION:  GENERAL:  54 y.o.-year-old patient lying in the bed with no acute distress.  EYES: Pupils equal, round, reactive to light and accommodation. No scleral icterus. Extraocular muscles intact.  HEENT: Head atraumatic, normocephalic. Oropharynx and nasopharynx clear.  NECK:  Supple, no jugular venous distention. No thyroid enlargement, no tenderness.  LUNGS: Normal breath sounds bilaterally, no wheezing, rales,rhonchi or crepitation. No use of accessory muscles of respiration.  CARDIOVASCULAR: S1, S2 normal. No murmurs, rubs, or gallops.  ABDOMEN: Soft, non-tender, non-distended. Bowel sounds present, though diminished, obese abdomen. Difficult to examine. No organomegaly or mass.  EXTREMITIES: No pedal edema, cyanosis, or clubbing.  NEUROLOGIC: Cranial nerves II through XII are intact. Muscle strength 5/5 in all extremities. Sensation intact. Gait not checked.  PSYCHIATRIC: The patient is alert and oriented x 3.  SKIN: No obvious rash, lesion, or ulcer.   DATA REVIEW:   CBC  Recent Labs Lab 05/26/15 0414  WBC 14.2*  HGB 9.7*  HCT 30.3*  PLT 61*    Chemistries   Recent Labs Lab 05/19/2015 0517  05/23/15 0540  June 01, 2015 0428  NA 137  < > 140  < > 137  K 4.8  < > 4.1  < > 4.9  CL 103  < > 101  < > 98*  CO2 26  < > 30  < > 26  GLUCOSE 136*  < > 144*  < > 100*  BUN 78*  < > 70*  < > 92*  CREATININE 4.39*  < > 3.48*  < > 4.82*  CALCIUM 8.4*  < > 8.4*  < > 8.9  MG 2.5*  --   --   --   --   AST  --   --  16  --   --   ALT  --   --  13*  --   --   ALKPHOS  --   --  50  --   --   BILITOT  --   --  1.5*  --   --   < > = values in this interval not displayed.  Cardiac Enzymes  Recent Labs Lab 05/22/15 2252  TROPONINI 0.10*    Microbiology Results  Results for orders placed or performed during the hospital encounter of 04/25/2015  Culture, blood (routine x  2)     Status: None   Collection Time: 05/09/2015  4:10 PM  Result Value Ref Range Status   Specimen Description BLOOD RIGHT [facility]  Final   Special Requests BOTTLES  DRAWN AEROBIC AND ANAEROBIC  9CC  Final   Culture NO GROWTH 5 DAYS  Final   Report Status 05/22/2015 FINAL  Final  Culture, blood (routine x 2)     Status: None   Collection Time: 05/03/2015  4:18 PM  Result Value Ref Range Status   Specimen Description BLOOD LEFT HAND  Final   Special Requests BOTTLES DRAWN AEROBIC AND ANAEROBIC  4CC  Final   Culture NO GROWTH 5 DAYS  Final   Report Status 05/22/2015 FINAL  Final  MRSA PCR Screening     Status: None   Collection Time: 05/20/15  1:53 PM  Result Value Ref Range Status   MRSA by PCR NEGATIVE NEGATIVE Final    Comment:        The GeneXpert MRSA Assay (FDA approved for NASAL specimens only), is one component of a comprehensive MRSA colonization surveillance program. It is not intended to diagnose MRSA infection nor to guide or monitor treatment for MRSA infections.   Culture, bal-quantitative     Status: None   Collection Time: 05/23/2015  9:28 AM  Result Value Ref Range Status   Specimen Description BRONCHIAL ALVEOLAR LAVAGE  Final   Special Requests Immunocompromised  Final   Gram Stain   Final    EXCELLENT SPECIMEN - 90-100% WBCS MANY WBC SEEN MANY GRAM POSITIVE COCCI IN PAIRS    Culture HEAVY GROWTH STREPTOCOCCUS PNEUMONIAE  Final   Report Status 05/25/2015 FINAL  Final   Organism ID, Bacteria STREPTOCOCCUS PNEUMONIAE  Final      Susceptibility   Streptococcus pneumoniae - MIC*    VANCOMYCIN Value in next row Sensitive      SENSITIVE0.25    PENICILLIN Value in next row Sensitive      SENSITIVE1    CEFTRIAXONE Value in next row Sensitive      SENSITIVE0.5    LEVOFLOXACIN Value in next row Sensitive      SENSITIVE1    * HEAVY GROWTH STREPTOCOCCUS PNEUMONIAE  Culture, blood (routine x 2)     Status: None   Collection Time: 05/01/2015  2:52 PM  Result Value Ref  Range Status   Specimen Description BLOOD LEFT ASSIST CONTROL  Final   Special Requests BAA 8CC AER 8CC  Final   Culture NO GROWTH 5 DAYS  Final   Report Status 05/26/2015 FINAL  Final  Culture, blood (routine x 2)     Status: None   Collection Time: 05/06/2015  3:44 PM  Result Value Ref Range Status   Specimen Description BLOOD LEFT ASSIST CONTROL  Final   Special Requests NONE  Final   Culture NO GROWTH 5 DAYS  Final   Report Status 05/26/2015 FINAL  Final    RADIOLOGY:  Dg Abd Acute W/chest  06-24-2015   CLINICAL DATA:  Abdominal pain and distention.  EXAM: DG ABDOMEN ACUTE W/ 1V CHEST  COMPARISON:  None.  FINDINGS: There is a large volume free intraperitoneal air. There is moderate nonspecific gaseous distention of the colon. No pneumatosis is evident. The lungs are grossly clear.  IMPRESSION: Large volume free intraperitoneal air. Critical Value/emergent results were called by telephone at the time of interpretation on 24-Jun-2015 at 2:36 am to nurse Hansel Starling, who verbally acknowledged these results.   Electronically Signed   By: Ellery Plunk M.D.   On: 06/24/2015 02:38    EKG:   Orders placed or performed during the hospital encounter of 05/07/2015  . ED EKG  . ED EKG  . EKG  12-Lead  . EKG 12-Lead  . EKG 12-Lead  . EKG 12-Lead  . EKG 12-Lead  . EKG 12-Lead      Management plans discussed with the patient, family and they are in agreement.  CODE STATUS:  DO NOT RESUSCITATE  TOTAL TIME TAKING CARE OF THIS PATIENT: 40  minutes.    Katharina Caper M.D on 06/05/2015 at 2:12 PM  Between 7am to 6pm - Pager - [phone]  After 6pm go to [email or web address] - password [email or web address] [email or web address]  [facility]  Office  [phone]  CC: Primary care physician; Verlee Monte, PA-C

## 2015-06-25 NOTE — Progress Notes (Signed)
RN called stating patient died at 5:34 AM before he was transferred to ICU. Patient on DO NOT RESUSCITATE status. Patient was pronounced dead by RN at 5:34 AM on June 17, 2015.

## 2015-06-25 NOTE — Progress Notes (Signed)
Alerted by telemetry clerk that pt's HR was dropping. Pt in agonal breathing and unresponsive to sternal rub. See post-mortum charting.

## 2015-06-25 NOTE — Progress Notes (Signed)
ANTIBIOTIC CONSULT NOTE - INITIAL  Pharmacy Consult for meropenem Indication: IAI  No Active Allergies  Patient Measurements: Height:  (170.2 cm) Weight: (!) 364 lb 12.8 oz (165.472 kg) IBW/kg (Calculated) : 66.1 Adjusted Body Weight: 105.8 kg  Vital Signs: Temp: 98.2 F (36.8 C) (10/02 1942) Temp Source: Oral (10/02 1942) BP: 148/69 mmHg (10/02 1942) Pulse Rate: 90 (10/02 1942) Intake/Output from previous day: 10/02 0701 - 10/03 0700 In: 850 [P.O.:600; IV Piggyback:250] Out: 700 [Urine:700] Intake/Output from this shift: Total I/O In: -  Out: 50 [Urine:50]  Labs:  Recent Labs  05/25/15 2233 05/26/15 0414  WBC 12.4* 14.2*  HGB 9.7* 9.7*  PLT 52* 61*  CREATININE 3.16* 3.55*   Estimated Creatinine Clearance: 35.2 mL/min (by C-G formula based on Cr of 3.55). No results for input(s): VANCOTROUGH, VANCOPEAK, VANCORANDOM, GENTTROUGH, GENTPEAK, GENTRANDOM, TOBRATROUGH, TOBRAPEAK, TOBRARND, AMIKACINPEAK, AMIKACINTROU, AMIKACIN in the last 72 hours.   Microbiology: Recent Results (from the past 720 hour(s))  MRSA PCR Screening     Status: None   Collection Time: 05/01/15  9:42 PM  Result Value Ref Range Status   MRSA by PCR NEGATIVE NEGATIVE Final    Comment:        The GeneXpert MRSA Assay (FDA approved for NASAL specimens only), is one component of a comprehensive MRSA colonization surveillance program. It is not intended to diagnose MRSA infection nor to guide or monitor treatment for MRSA infections.   Culture, blood (routine x 2)     Status: None   Collection Time: 2015-05-22  4:10 PM  Result Value Ref Range Status   Specimen Description BLOOD RIGHT AC  Final   Special Requests BOTTLES DRAWN AEROBIC AND ANAEROBIC  9CC  Final   Culture NO GROWTH 5 DAYS  Final   Report Status 05/22/2015 FINAL  Final  Culture, blood (routine x 2)     Status: None   Collection Time: May 22, 2015  4:18 PM  Result Value Ref Range Status   Specimen Description BLOOD LEFT HAND   Final   Special Requests BOTTLES DRAWN AEROBIC AND ANAEROBIC  4CC  Final   Culture NO GROWTH 5 DAYS  Final   Report Status 05/22/2015 FINAL  Final  MRSA PCR Screening     Status: None   Collection Time: 05/20/15  1:53 PM  Result Value Ref Range Status   MRSA by PCR NEGATIVE NEGATIVE Final    Comment:        The GeneXpert MRSA Assay (FDA approved for NASAL specimens only), is one component of a comprehensive MRSA colonization surveillance program. It is not intended to diagnose MRSA infection nor to guide or monitor treatment for MRSA infections.   Culture, bal-quantitative     Status: None   Collection Time: 05/20/2015  9:28 AM  Result Value Ref Range Status   Specimen Description BRONCHIAL ALVEOLAR LAVAGE  Final   Special Requests Immunocompromised  Final   Gram Stain   Final    EXCELLENT SPECIMEN - 90-100% WBCS MANY WBC SEEN MANY GRAM POSITIVE COCCI IN PAIRS    Culture HEAVY GROWTH STREPTOCOCCUS PNEUMONIAE  Final   Report Status 05/25/2015 FINAL  Final   Organism ID, Bacteria STREPTOCOCCUS PNEUMONIAE  Final      Susceptibility   Streptococcus pneumoniae - MIC*    VANCOMYCIN Value in next row Sensitive      SENSITIVE0.25    PENICILLIN Value in next row Sensitive      SENSITIVE1    CEFTRIAXONE Value in next row  Sensitive      SENSITIVE0.5    LEVOFLOXACIN Value in next row Sensitive      SENSITIVE1    * HEAVY GROWTH STREPTOCOCCUS PNEUMONIAE  Culture, blood (routine x 2)     Status: None   Collection Time: 06-04-15  2:52 PM  Result Value Ref Range Status   Specimen Description BLOOD LEFT ASSIST CONTROL  Final   Special Requests BAA 8CC AER 8CC  Final   Culture NO GROWTH 5 DAYS  Final   Report Status 05/26/2015 FINAL  Final  Culture, blood (routine x 2)     Status: None   Collection Time: 06/04/2015  3:44 PM  Result Value Ref Range Status   Specimen Description BLOOD LEFT ASSIST CONTROL  Final   Special Requests NONE  Final   Culture NO GROWTH 5 DAYS  Final   Report  Status 05/26/2015 FINAL  Final    Medical History: Past Medical History  Diagnosis Date  . Hypertension   . COPD (chronic obstructive pulmonary disease)   . Chronic kidney disease   . Chronic back pain greater than 3 months duration   . Phlebitis 1992    left calf  . Anxiety and depression   . CHF (congestive heart failure)   . Hepatitis B     Medications:  Infusions:   Assessment: 53 yom with abdominal xray showing intraperitoneal free air, suspect possible perforation, surgery to evaluate. Recommended starting meropenem. CrCl 26 to 50 mL/min, started 1 gm IV Q12H.   Goal of Therapy:  Prevent/resolve infection  Plan:  Expected duration 5 days with resolution of temperature and/or normalization of WBC. Meropenem 1 gm IV Q12H.   Carola Frost, Pharm.D. Clinical Pharmacist 06/19/2015,3:59 AM

## 2015-06-25 NOTE — Progress Notes (Signed)
Pt yelling out in pain. States it is located in his abdomen. Upon assessment abdomen is distended, tight, tender to light touch all over, and has discoloration at the base. Dr. Anne Hahn notified and will order a STAT lactic acid and abdominal X-ray for further assessment. Will continue to monitor and provide emotional support

## 2015-06-25 DEATH — deceased

## 2015-07-04 LAB — ACID FAST SMEAR+CULTURE W/RFLX (ARMC ONLY)
ACID FAST CULTURE: NEGATIVE
Acid Fast Smear: NEGATIVE

## 2015-07-09 ENCOUNTER — Ambulatory Visit: Payer: Self-pay | Admitting: Family

## 2016-06-27 IMAGING — US US RENAL
1 series · 14 of 22 positions shown · non-contrast
Comparison: 11/16/2014 CT

CLINICAL DATA: Acute renal failure.

EXAM:
RENAL / URINARY TRACT ULTRASOUND COMPLETE

[Series 1: us renal · 14 of 22 slices shown]
[im 1/22]
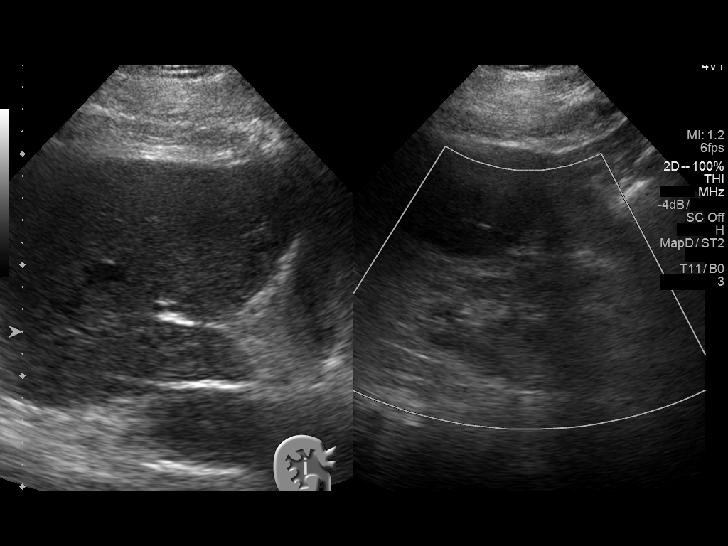
[im 3/22]
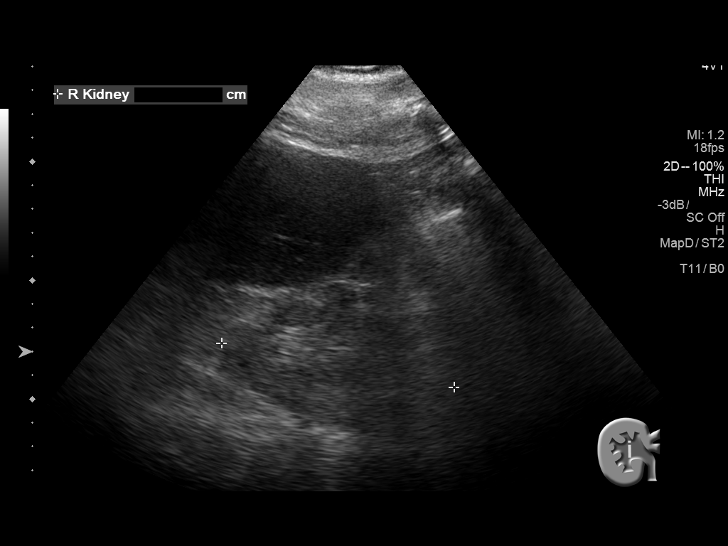
[im 4/22]
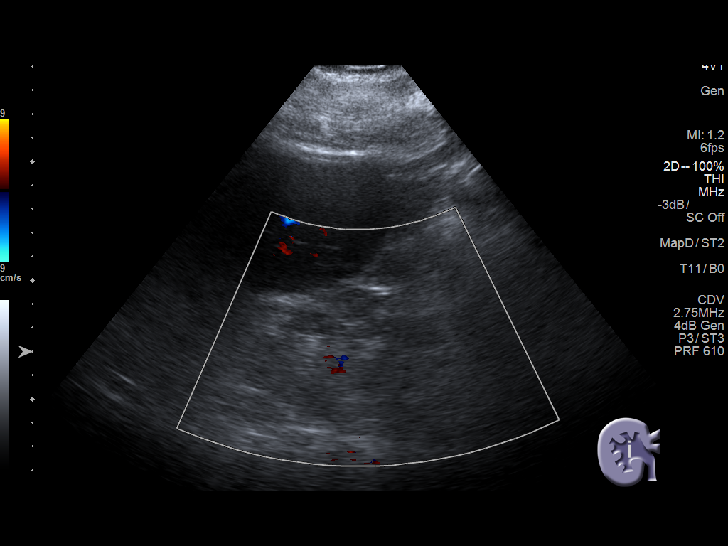
[im 6/22]
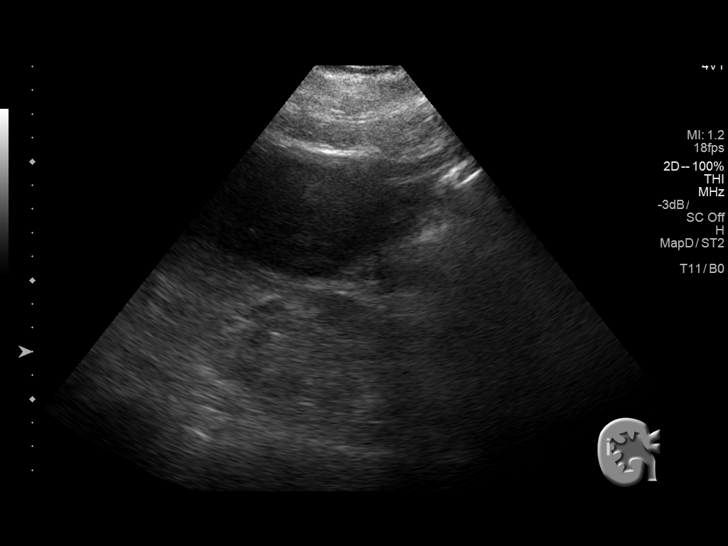
[im 8/22]
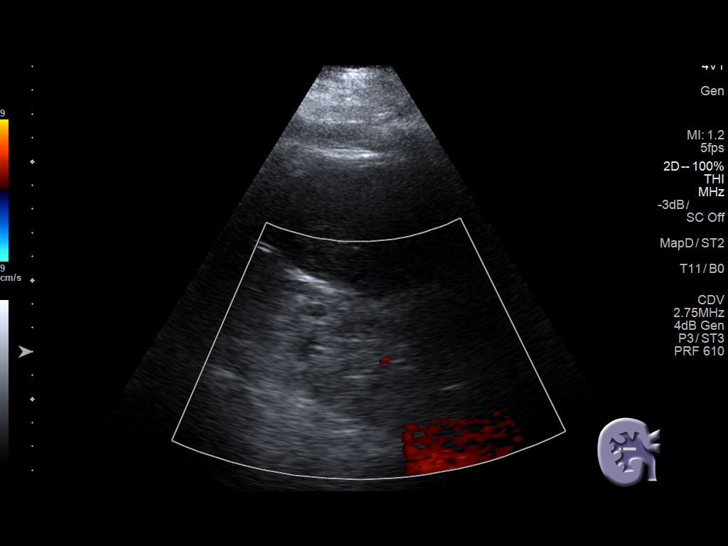
[im 9/22]
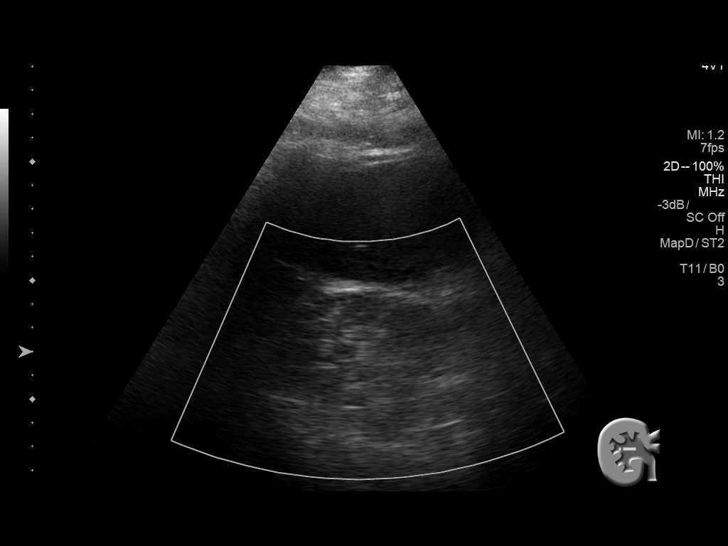
[im 11/22]
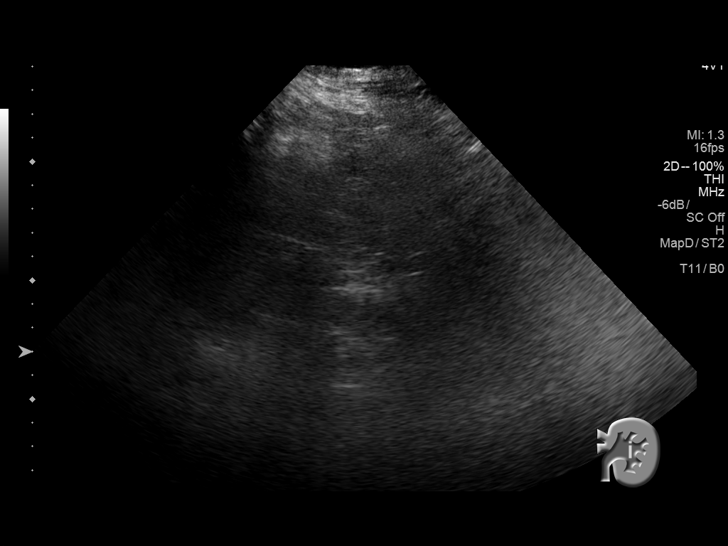
[im 12/22]
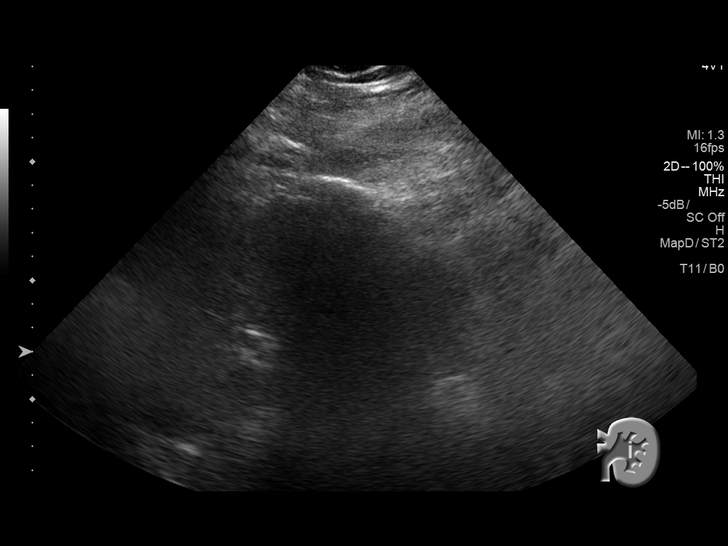
[im 14/22]
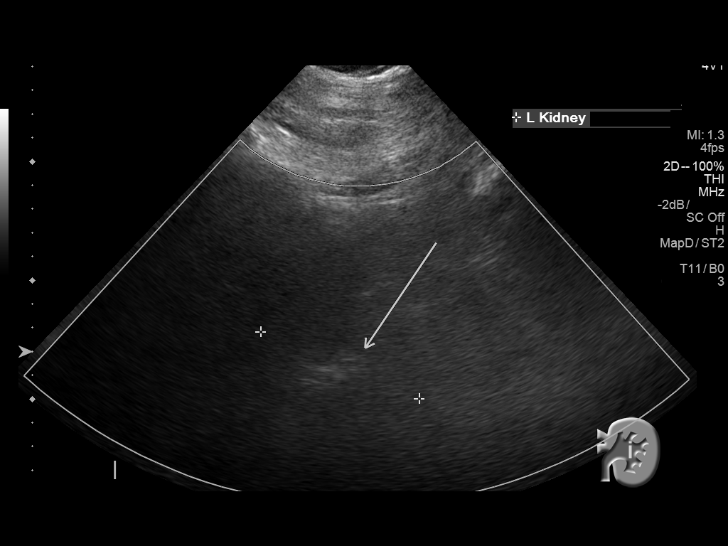
[im 15/22]
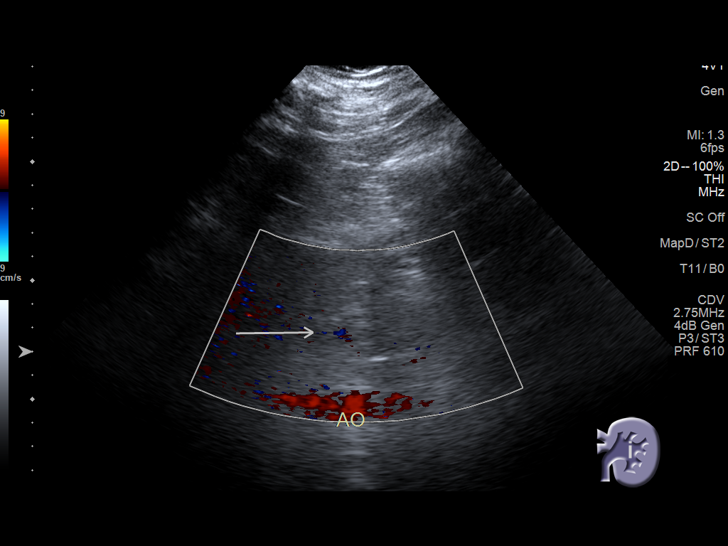
[im 17/22]
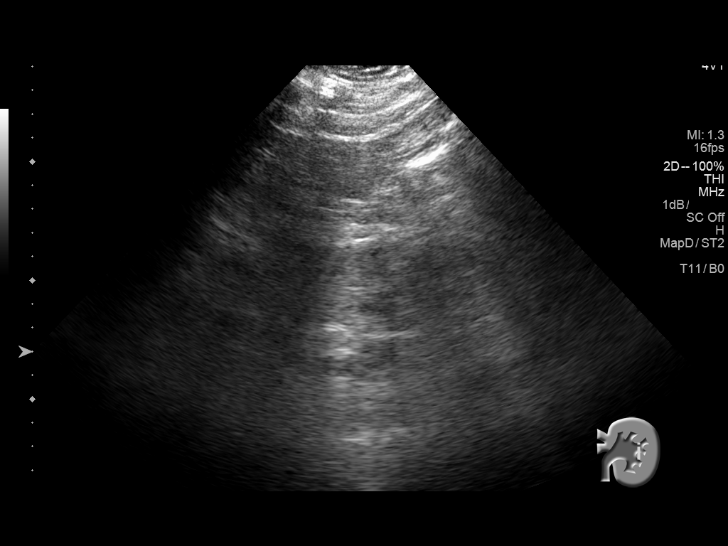
[im 19/22]
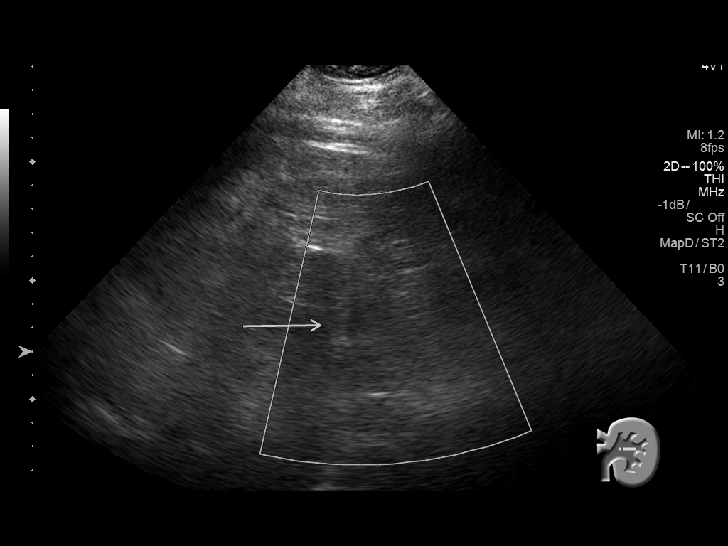
[im 20/22]
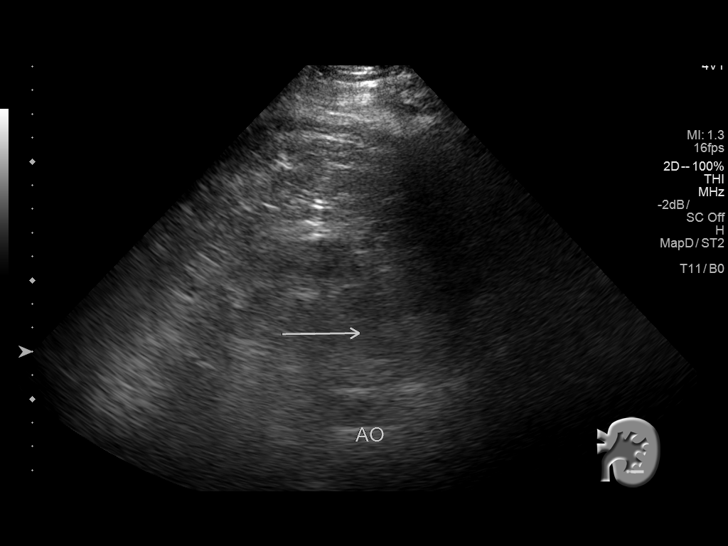
[im 22/22]
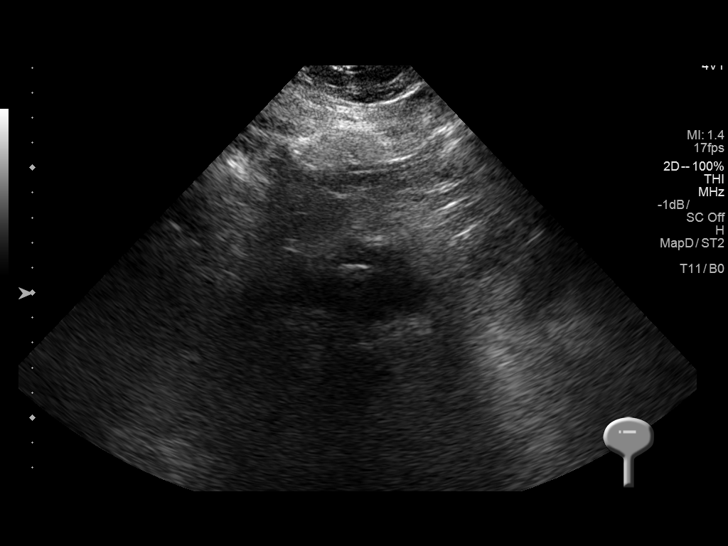

[14 of 22 positions shown; findings below may reference images not displayed]

FINDINGS: Right Kidney:

Length: 7.2 cm. Echogenic renal parenchyma. No focal mass or
hydronephrosis.

Left Kidney:

Length: Estimated to be 10.0 cm.. Parenchyma is echogenic. There is
very poor visualization of the left kidney. Focal masses would be
difficult to exclude. No obvious hydronephrosis identified.

Bladder:

A Foley catheter decompresses the bladder.

Additional:

Study is technically compromised by patient body habitus and limited
mobility.
IMPRESSION: 1. Echogenic kidneys bilaterally. Findings are consistent with
intrinsic renal disease.
2. Very limited evaluation of the left kidney given the patient's
body habitus and limited mobility.
3. No evidence for hydronephrosis.

## 2017-01-02 IMAGING — CT CT HEAD W/O CM
3 series · 16 of 30 positions shown, 17 images · non-contrast
Comparison: Brain CT 03/09/2011

CLINICAL DATA: Patient with shortness of breath. Pinpoint pupils.
Altered mental status.

EXAM:
CT HEAD WITHOUT CONTRAST
TECHNIQUE: Contiguous axial images were obtained from the base of the skull
through the vertex without intravenous contrast.

[Series 2: head bone · axial · 0.51mm/px · z∈[+927,+1087]mm · 8 of 98 slices shown]
[im 12/98  bone]
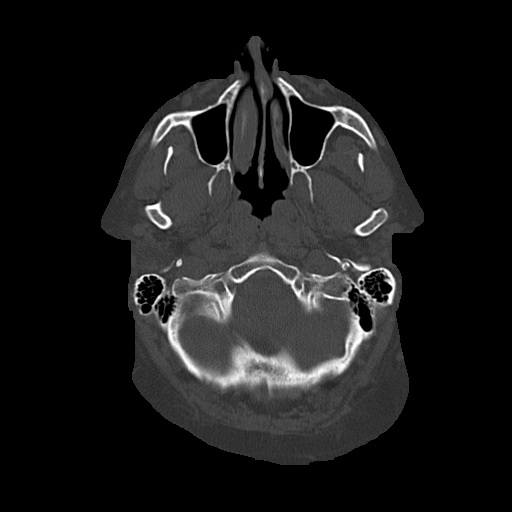
[im 23/98  bone]
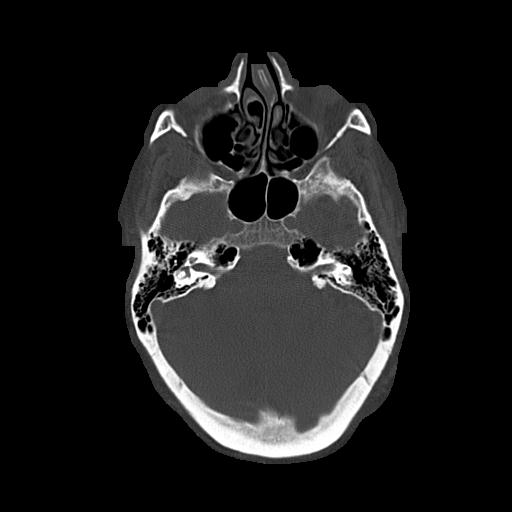
[im 35/98  bone]
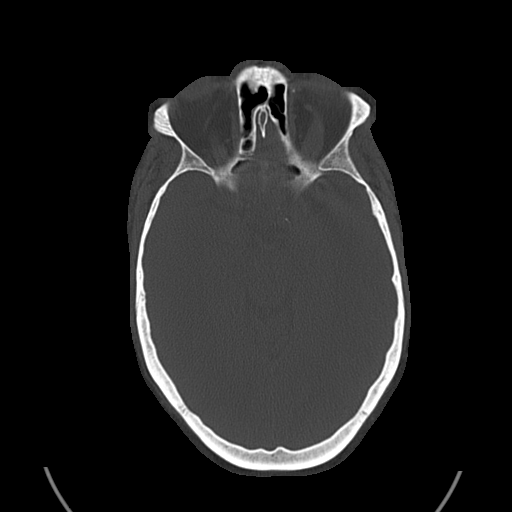
[im 46/98  bone]
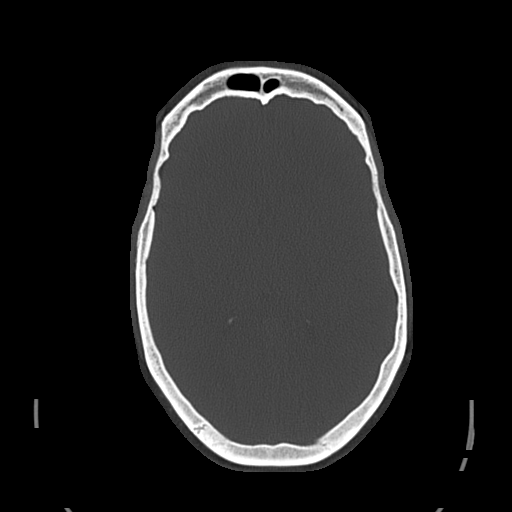
[im 58/98  bone]
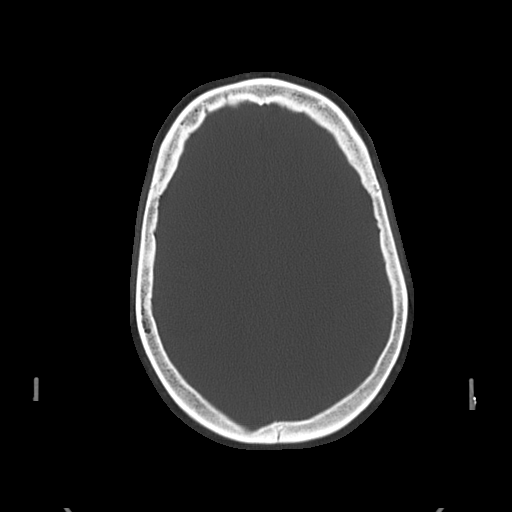
[im 69/98  bone]
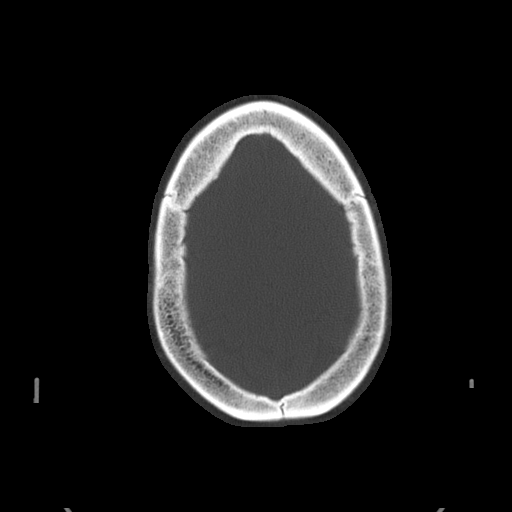
[im 80/98  bone]
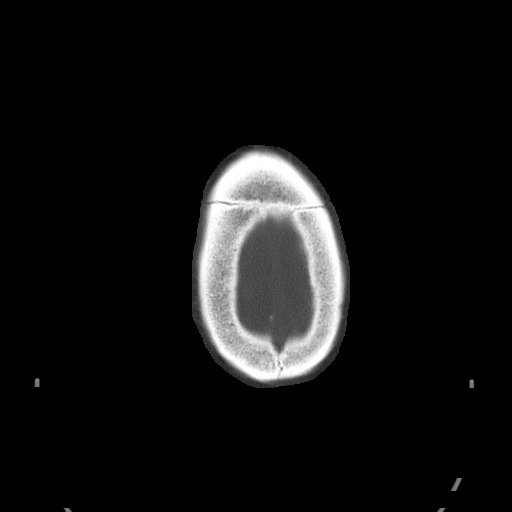
[im 92/98  bone]
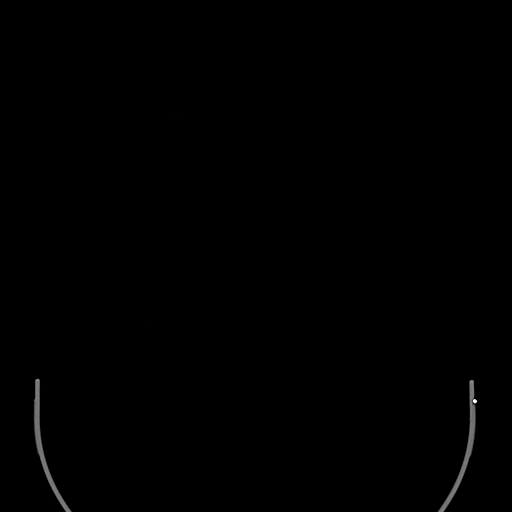

[Series 3: head wo · axial · 0.51mm/px · z∈[+949,+1054]mm · 4 of 36 slices shown, 5 images]
[im 8/36  brain]
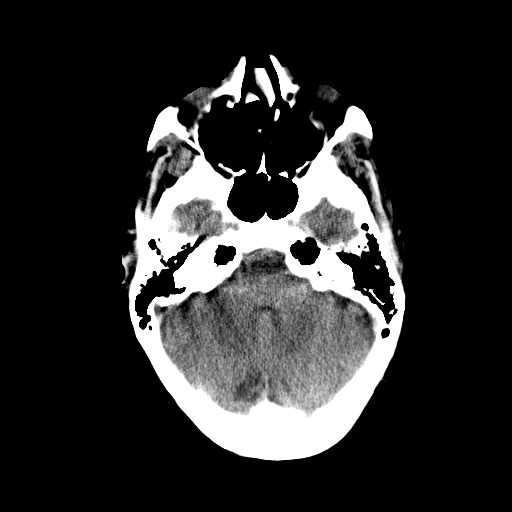
[im 8/36  bone]
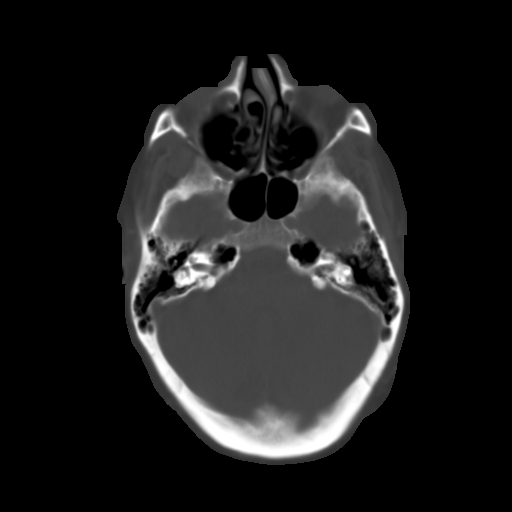
[im 15/36  brain]
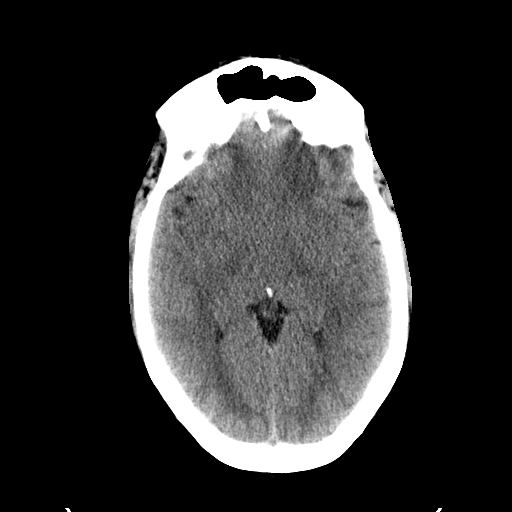
[im 22/36  brain]
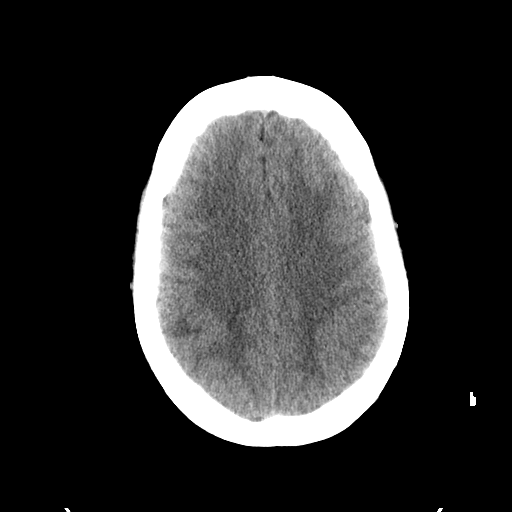
[im 29/36  brain]
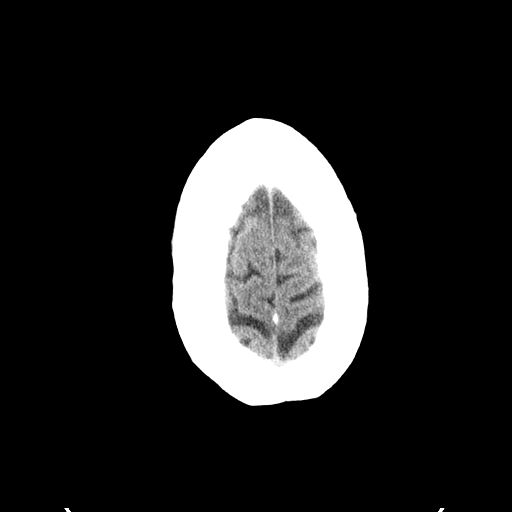

[Series 4: head wo recon · axial · 0.41mm/px · z∈[+1028,+1113]mm · 4 of 33 slices shown]
[im 7/33  brain]
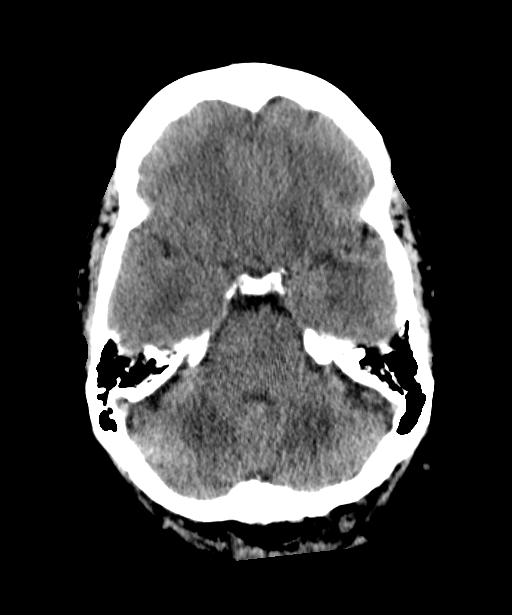
[im 13/33  brain]
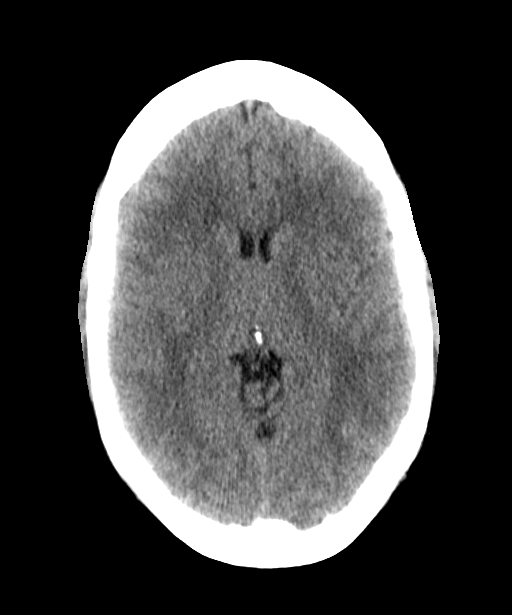
[im 20/33  brain]
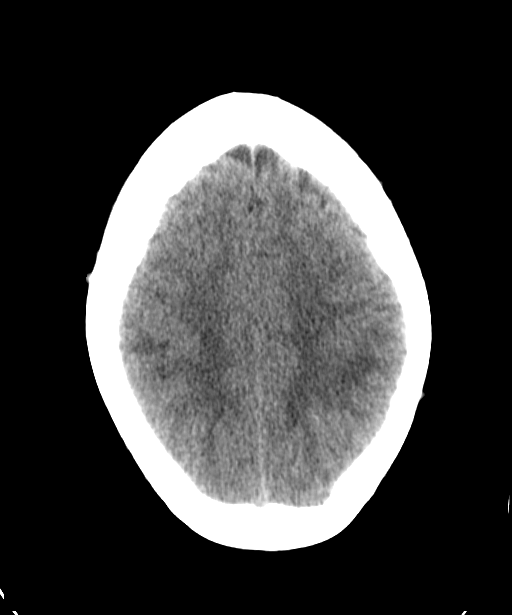
[im 26/33  brain]
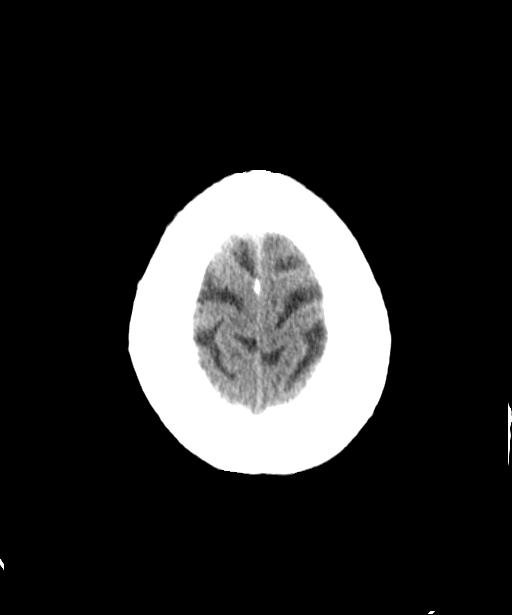

[16 of 30 positions shown; findings below may reference images not displayed]

FINDINGS: Limited evaluation secondary to technique. Within the right parietal
lobe (image 21; series 4) there is a focal area of low-attenuation.
Old right medial cerebellar hemisphere infarct. No evidence for
acute intracranial hemorrhage, mass lesion or mass-effect. Orbits
are unremarkable. Leftward deviation of the nasal septum. Mastoid
air cells are unremarkable. Paranasal sinuses are unremarkable.
IMPRESSION: Focal area of low attenuation within the right parietal lobe which
is indeterminate in etiology (possibly volume-averaging through
sulci or potentially age-indeterminate infarct). Consider further
evaluation if the patient is presenting with localized
symptomatology.

No acute intracranial hemorrhage.
